# Patient Record
Sex: Female | Born: 1980 | Race: White | Hispanic: No | Marital: Married | Smoking: Former smoker
Health system: Southern US, Community
[De-identification: ages and names within clinical notes are randomized; demographics above are authoritative.]

## PROBLEM LIST (undated history)

## (undated) ENCOUNTER — Inpatient Hospital Stay: Payer: Self-pay

## (undated) DIAGNOSIS — T7840XA Allergy, unspecified, initial encounter: Secondary | ICD-10-CM

## (undated) DIAGNOSIS — F32A Depression, unspecified: Secondary | ICD-10-CM

## (undated) DIAGNOSIS — F329 Major depressive disorder, single episode, unspecified: Secondary | ICD-10-CM

## (undated) DIAGNOSIS — R251 Tremor, unspecified: Secondary | ICD-10-CM

## (undated) HISTORY — DX: Major depressive disorder, single episode, unspecified: F32.9

## (undated) HISTORY — DX: Depression, unspecified: F32.A

## (undated) HISTORY — DX: Tremor, unspecified: R25.1

## (undated) HISTORY — DX: Allergy, unspecified, initial encounter: T78.40XA

---

## 2008-07-11 HISTORY — PX: TONSILLECTOMY: SUR1361

## 2008-11-19 DIAGNOSIS — J309 Allergic rhinitis, unspecified: Secondary | ICD-10-CM | POA: Insufficient documentation

## 2012-07-02 ENCOUNTER — Ambulatory Visit: Payer: Self-pay | Admitting: Obstetrics and Gynecology

## 2012-08-02 ENCOUNTER — Encounter: Payer: Self-pay | Admitting: Internal Medicine

## 2012-08-02 ENCOUNTER — Ambulatory Visit (INDEPENDENT_AMBULATORY_CARE_PROVIDER_SITE_OTHER): Payer: 59 | Admitting: Internal Medicine

## 2012-08-02 VITALS — BP 124/74 | HR 79 | Temp 98.2°F | Resp 16 | Ht 66.0 in | Wt 173.2 lb

## 2012-08-02 DIAGNOSIS — R5381 Other malaise: Secondary | ICD-10-CM

## 2012-08-02 DIAGNOSIS — Z0000 Encounter for general adult medical examination without abnormal findings: Secondary | ICD-10-CM

## 2012-08-02 DIAGNOSIS — Z87891 Personal history of nicotine dependence: Secondary | ICD-10-CM | POA: Insufficient documentation

## 2012-08-02 DIAGNOSIS — G250 Essential tremor: Secondary | ICD-10-CM | POA: Insufficient documentation

## 2012-08-02 DIAGNOSIS — E663 Overweight: Secondary | ICD-10-CM

## 2012-08-02 DIAGNOSIS — Z720 Tobacco use: Secondary | ICD-10-CM

## 2012-08-02 DIAGNOSIS — F17200 Nicotine dependence, unspecified, uncomplicated: Secondary | ICD-10-CM

## 2012-08-02 DIAGNOSIS — R5383 Other fatigue: Secondary | ICD-10-CM

## 2012-08-02 DIAGNOSIS — E669 Obesity, unspecified: Secondary | ICD-10-CM | POA: Insufficient documentation

## 2012-08-02 DIAGNOSIS — G252 Other specified forms of tremor: Secondary | ICD-10-CM

## 2012-08-02 DIAGNOSIS — Z13220 Encounter for screening for lipoid disorders: Secondary | ICD-10-CM

## 2012-08-02 LAB — COMPREHENSIVE METABOLIC PANEL
BUN: 13 mg/dL (ref 6–23)
CO2: 27 mEq/L (ref 19–32)
Creatinine, Ser: 0.7 mg/dL (ref 0.4–1.2)
GFR: 102.73 mL/min (ref >60.00)
Glucose, Bld: 84 mg/dL (ref 70–99)
Total Bilirubin: 0.7 mg/dL (ref 0.3–1.2)

## 2012-08-02 LAB — LIPID PANEL
Cholesterol: 123 mg/dL (ref 0–200)
HDL: 36.3 mg/dL — ABNORMAL LOW (ref >39.00)
Triglycerides: 50 mg/dL (ref 0.0–149.0)
VLDL: 10 mg/dL (ref 0.0–40.0)

## 2012-08-02 LAB — CBC WITH DIFFERENTIAL/PLATELET
Basophils Relative: 0.9 % (ref 0.0–3.0)
Eosinophils Relative: 1.8 % (ref 0.0–5.0)
Lymphocytes Relative: 28 % (ref 12.0–46.0)
Monocytes Relative: 9.5 % (ref 3.0–12.0)
Neutrophils Relative %: 59.8 % (ref 43.0–77.0)
RBC: 4.24 Mil/uL (ref 3.87–5.11)
WBC: 5.7 10*3/uL (ref 4.5–10.5)

## 2012-08-02 MED ORDER — VARENICLINE TARTRATE 0.5 MG X 11 & 1 MG X 42 PO MISC: ORAL | Status: DC

## 2012-08-02 MED ORDER — VARENICLINE TARTRATE 1 MG PO TABS: 1.0000 mg | ORAL_TABLET | Freq: Two times a day (BID) | ORAL | Status: DC

## 2012-08-02 NOTE — Progress Notes (Signed)
Patient ID: Brianna Washington, female   DOB: [birth year], 32 y.o.   MRN: [id number]   Patient Active Problem List  Diagnosis  . Routine general medical examination at a health care facility  . Tobacco abuse  . Overweight  . Essential tremor    Subjective:  CC:   Chief Complaint  Patient presents with  . Establish Care    HPI:   Brianna Washington is a 32 y.o. 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   Abdominal/pelvic cramping and intermenstrual spotting. She had a Mirena IUD placed in June,  and had ultrasound in Dec which confirmed proper positioning. She is awaiting followup with Dr. Toya Smothers,    Tobacco abuse .  Planning to quit before her wedding in August.  Has had two prior trials of smoking cessations.  Wants to resume chantix.   Essential tremor.   Left hand more than right. Aggravated by fatigue and anxiety. Drinks 2 or 3  Cans of caffeinated soda daily along with occasional coughing.  Has cut back on soda. And saw no difference. Takes primodone and propanolol prescribed by former PCP  following a formal  neurolgoy eval while living in [place].  Her grandmother has an essential tremor which is progressive and may have been diagnosed with Parkinson's.  She is concerned that she will progress like her grandfather who has a lot of trouble now.   Overweight,  Wants to lose 20 lbs by August.  Not exercising or dieting yet. Has lost weight in the past  10 lbs/3 months  history of  Right breast lump,  Evaluated by [facility] with ultrasound,  ultrasound report suggested  Fatty tumor,  No biopsy necessary. it was recommended that she resume regular screening at age 33.     Past Medical History  Diagnosis Date  . Allergy   . Tremors of nervous system     Essential Tremors    Past Surgical History  Procedure Date  .  Tonsillectomy 2010    Family History  Problem Relation Age of Onset  . Hyperlipidemia Mother   . Hypertension Mother   . Hyperthyroidism Mother   . Hyperlipidemia Father   . Cancer Maternal Grandmother 60    metastatic breast ca   . Heart attack Maternal Grandfather   . Heart disease Maternal Grandfather     AMI  . Cancer Paternal Grandmother     NOn-Hodgkins Lymphoma  . Diabetes Paternal Grandmother   . Heart disease Paternal Grandfather     History   Social History  . Marital Status: Single    Spouse Name: N/A    Number of Children: N/A  . Years of Education: N/A   Occupational History  . Not on file.   Social History Main Topics  . Smoking status: Current Every Day Smoker -- 0.2 packs/day    Types: Cigarettes  . Smokeless tobacco: Not on file  . Alcohol Use: Yes  . Drug Use: No  . Sexually Active: Yes    Birth Control/ Protection: IUD     Comment: quit for 2 years once , has tried Chantix 6 months    Other Topics Concern  . Not on file   Social History Narrative  . 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    Objective:  BP 124/74  Pulse 79  Temp 98.2 F (36.8 C) (Oral)  Resp 16  Ht 5\' 6"  (1.676 m)  Wt 173 lb 4 oz (78.586 kg)  BMI 27.96 kg/m2  SpO2 98%  General appearance: alert, cooperative and appears stated age Ears: normal TM's and external ear canals both ears Throat: lips, mucosa, and tongue normal; teeth and gums normal Neck: no  adenopathy, no carotid bruit, supple, symmetrical, trachea midline and thyroid not enlarged, symmetric, no tenderness/mass/nodules Back: symmetric, no curvature. ROM normal. No CVA tenderness. Lungs: clear to auscultation bilaterally Heart: regular rate and rhythm, S1, S2 normal, no murmur, click, rub or gallop Abdomen: soft, non-tender; bowel sounds normal; no masses,  no organomegaly Pulses: 2+ and symmetric Skin: Skin color, texture, turgor normal. No rashes or lesions Lymph nodes: Cervical, supraclavicular, and axillary nodes normal.  Assessment and Plan:  Essential tremor She has had a prior neurology evaluation with no evidence of Parkinson's disease. We'll continue current medications for now and follow symptoms.  Tobacco abuse She's had prior to smoking cessation trials and wants to resume Chantix. She's been given prescriptions for the starting pack  And  monthly continuation pack. I have advised a minimum of 3 months of therapy to increase her chances for success.  Routine general medical examination at a health care facility She has regular GYN followup with Dr. Yetta Barre. Screening for hyperlipidemia thyroid anemia and diabetes to be done after today's visit with fasting labs.  Overweight I have addressed  BMI and recommended a low glycemic index diet utilizing smaller more frequent meals to increase metabolism.  I have also recommended that patient start exercising with a goal of 30 minutes of aerobic exercise a minimum of 5 days per week. Screening for lipid disorders, thyroid and diabetes to be done today.     Updated Medication List Outpatient Encounter Prescriptions as of 08/02/2012  Medication Sig Dispense Refill  . cetirizine (ZYRTEC) 10 MG tablet Take 10 mg by mouth daily.      . clonazePAM (KLONOPIN) 1 MG tablet Take 1 mg by mouth at bedtime as needed.      Marland Kitchen ibuprofen (ADVIL,MOTRIN) 800 MG tablet Take 800 mg by mouth 2 (two) times daily.      Marland Kitchen levonorgestrel (MIRENA)  20 MCG/24HR IUD 1 each by Intrauterine route once.      . Multiple Vitamin (MULTIVITAMIN) tablet Take 1 tablet by mouth daily.      Marland Kitchen PRIMIDONE PO Take 25 mg by mouth at bedtime.      . propranolol (INDERAL) 10 MG tablet Take 10 mg by mouth 3 (three) times daily.      . vitamin C (ASCORBIC ACID) 500 MG tablet Take 500 mg by mouth daily.      . varenicline (CHANTIX CONTINUING MONTH PAK) 1 MG tablet Take 1 tablet (1 mg total) by mouth 2 (two) times daily.  60 tablet  2  . varenicline (CHANTIX STARTING MONTH PAK) 0.5 MG X 11 & 1 MG X 42 tablet Take one 0.5 mg tablet by mouth once daily for 3 days, then increase to  one 0.5 mg tablet twice daily for 4 days, then increase to one 1 mg tablet twice daily.  53 tablet  0     Orders Placed This Encounter  Procedures  . CBC with Differential  . Comprehensive metabolic panel  . Lipid panel  . TSH    No Follow-up on file.

## 2012-08-02 NOTE — Patient Instructions (Addendum)
If you want to lose 20 lbs over the next 6 months ,  Try a low carb diet with small meals every 3 hours (6 total meals daily).   This is  my version of a  "Low GI"  Diet:  It is not ultra low carb, but will still lower your blood sugars and allow you to lose 5 to 10 lbs per month if you follow it carefully. All of the foods can be found at grocery stores and in bulk at [employer].  The Atkins protein bars and shakes are available in more varieties at Target, WalMart and Lowe's Foods.     7 AM Breakfast:  Low carbohydrate Protein  Shakes (I recommend the EAS AdvantEdge "Carb Control" shakes  Or the low carb shakes by Atkins.   Both are available everywhere:  In  cases at BJs  Or in 4 packs at grocery stores and pharmacies  2.5 carbs  (Alternative is  a toasted Arnold's Sandwhich Thin w/ peanut butter, a "Bagel Thin" with cream cheese and salmon) or  a scrambled egg burrito made with a low carb tortilla .  Avoid cereal and bananas, oatmeal too unless you are cooking the old fashioned kind that takes 30-40 minutes to prepare.  the rest is overly processed, has minimal fiber, and is loaded with carbohydrates!   10 AM: Protein bar by Atkins (the snack size, under 200 cal).  There are many varieties , available widely again or in bulk in limited varieties at BJs)  Other so called "protein bars" tend to be loaded with carbohydrates.  Remember, in food advertising, the word "energy" is synonymous for " carbohydrate."  Lunch: sandwich of [place], (or any lunchmeat, grilled meat or canned tuna), fresh avocado, mayonnaise  and cheese on a lower carbohydrate pita bread, flatbread, or tortilla . Ok to use regular mayonnaise. The bread is the only source or carbohydrate that can be decreased (Joseph's makes a pita bread and a flat bread that are 50 cal and 4 net carbs ; Toufayan makes a low carb flatbread that's 100 cal and 9 net carbs  and  Mission makes a low carb whole wheat tortilla  That is 210 cal and 6 net  carbs)  3 PM:  Mid day :  Another protein bar,  Or a  cheese stick (100 cal, 0 carbs),  Or 1 ounce of  almonds, walnuts, pistachios, pecans, peanuts,  Macadamia nuts. Or a Dannon light n Fit greek yogurt, 80 cal 8 net carbs . Avoid "granola"; the dried cranberries and raisins are loaded with carbohydrates. Mixed nuts ok if no raisins or cranberries or dried fruit.      6 PM  Dinner:  "mean and green:"  Meat/chicken/fish or a high protein legume; , with a green salad, and a low GI  Veggie (broccoli, cauliflower, green beans, spinach, brussel sprouts. Lima beans) : Avoid "Low fat dressings, as well as Reyne Dumas and [address]! They are loaded with sugar! Instead use ranch, vinagrette,  Blue cheese, etc.  There is a low carb pasta by Dreamfield's available at [facility] that is acceptable and tastes great. Try Michel Angel's chicken piccata over low carb pasta. The chicken dish is 0 carbs, and can be found in frozen section at BJs and Lowe's. Also try [employer] "Carnitas" (pulled pork, no sauce,  0 carbs) and his pot roast.   both are in the refrigerated section at BJs   Dreamfield's makes a low carb pasta only  5 g/serving.  Available at all grocery stores,  And tastes like normal pasta  9 PM snack : Breyer's "low carb" fudgsicle or  ice cream bar (Carb Smart line), or  Weight Watcher's ice cream bar , or another "no sugar added" ice cream;a serving of fresh berries/cherries with whipped cream (Avoid bananas, pineapple, grapes  and watermelon on a regular basis because they are high in sugar)   Remember that snack Substitutions should be less than 10 carbs per serving and meals < 20 carbs. Remember to subtract fiber grams and sugar alcohols to get the "net carbs."

## 2012-08-05 ENCOUNTER — Encounter: Payer: Self-pay | Admitting: Internal Medicine

## 2012-08-05 NOTE — Assessment & Plan Note (Signed)
She has regular GYN followup with Dr. Yetta Barre. Screening for hyperlipidemia thyroid anemia and diabetes to be done after today's visit with fasting labs.

## 2012-08-05 NOTE — Assessment & Plan Note (Signed)
I have addressed  BMI and recommended a low glycemic index diet utilizing smaller more frequent meals to increase metabolism.  I have also recommended that patient start exercising with a goal of 30 minutes of aerobic exercise a minimum of 5 days per week. Screening for lipid disorders, thyroid and diabetes to be done today.   

## 2012-08-05 NOTE — Assessment & Plan Note (Signed)
She's had prior to smoking cessation trials and wants to resume Chantix. She's been given prescriptions for the starting pack  And  monthly continuation pack. I have advised a minimum of 3 months of therapy to increase her chances for success.

## 2012-08-05 NOTE — Assessment & Plan Note (Signed)
She has had a prior neurology evaluation with no evidence of Parkinson's disease. We'll continue current medications for now and follow symptoms.

## 2012-08-25 ENCOUNTER — Other Ambulatory Visit: Payer: Self-pay

## 2012-09-09 ENCOUNTER — Encounter: Payer: Self-pay | Admitting: Internal Medicine

## 2012-10-03 ENCOUNTER — Telehealth: Payer: Self-pay | Admitting: General Practice

## 2012-10-03 MED ORDER — PRIMIDONE 50 MG PO TABS: 25.0000 mg | ORAL_TABLET | Freq: Every day | ORAL | Status: DC

## 2012-10-03 NOTE — Telephone Encounter (Signed)
Pt called stating that at her last office visit you were going to begin prescribing her Primodone medication. Pleas advise.

## 2012-10-03 NOTE — Telephone Encounter (Signed)
Yes I will,  Confirm that her dose is 25 mg daily at bedtime.

## 2012-10-04 NOTE — Telephone Encounter (Signed)
Pt states on same dosage. Med filled 3/27

## 2012-10-05 ENCOUNTER — Other Ambulatory Visit: Payer: Self-pay | Admitting: General Practice

## 2012-10-05 MED ORDER — PRIMIDONE 50 MG PO TABS: 25.0000 mg | ORAL_TABLET | Freq: Every day | ORAL | Status: DC

## 2012-12-14 ENCOUNTER — Telehealth: Payer: Self-pay | Admitting: *Deleted

## 2012-12-14 MED ORDER — CLONAZEPAM 1 MG PO TABS: 1.0000 mg | ORAL_TABLET | Freq: Every evening | ORAL | Status: DC | PRN

## 2012-12-14 NOTE — Telephone Encounter (Signed)
Patient stated you told her that you would refill her klonopin please advise

## 2012-12-14 NOTE — Telephone Encounter (Signed)
Ok to refill clonazepam,  Authorized in [employer]

## 2012-12-17 NOTE — Telephone Encounter (Signed)
Script faxed.

## 2012-12-31 ENCOUNTER — Telehealth: Payer: Self-pay | Admitting: *Deleted

## 2012-12-31 MED ORDER — PROPRANOLOL HCL 10 MG PO TABS: 10.0000 mg | ORAL_TABLET | Freq: Three times a day (TID) | ORAL | Status: DC

## 2012-12-31 NOTE — Telephone Encounter (Signed)
Medication refilled

## 2013-01-09 ENCOUNTER — Encounter: Payer: Self-pay | Admitting: Internal Medicine

## 2013-04-23 ENCOUNTER — Encounter: Payer: Self-pay | Admitting: *Deleted

## 2013-04-24 ENCOUNTER — Ambulatory Visit (INDEPENDENT_AMBULATORY_CARE_PROVIDER_SITE_OTHER): Payer: 59 | Admitting: Internal Medicine

## 2013-04-24 ENCOUNTER — Encounter: Payer: Self-pay | Admitting: Internal Medicine

## 2013-04-24 VITALS — BP 106/58 | HR 95 | Temp 98.5°F | Resp 14 | Wt 183.0 lb

## 2013-04-24 DIAGNOSIS — F489 Nonpsychotic mental disorder, unspecified: Secondary | ICD-10-CM

## 2013-04-24 DIAGNOSIS — F409 Phobic anxiety disorder, unspecified: Secondary | ICD-10-CM

## 2013-04-24 DIAGNOSIS — Z720 Tobacco use: Secondary | ICD-10-CM

## 2013-04-24 DIAGNOSIS — E663 Overweight: Secondary | ICD-10-CM

## 2013-04-24 DIAGNOSIS — G250 Essential tremor: Secondary | ICD-10-CM

## 2013-04-24 DIAGNOSIS — F17200 Nicotine dependence, unspecified, uncomplicated: Secondary | ICD-10-CM

## 2013-04-24 MED ORDER — CLONAZEPAM 1 MG PO TABS: 1.0000 mg | ORAL_TABLET | Freq: Every evening | ORAL | Status: DC | PRN

## 2013-04-24 NOTE — Patient Instructions (Signed)
You need to lose weight!  Make your first goal 10% of your body weight (18 lbs)   This is  My  version of a  "Low GI"  Diet:  It will still lower your blood sugars and allow you to lose 4 to 8  lbs  per month if you follow it carefully.  Your goal with exercise is a minimum of 30 minutes of aerobic exercise 5 days per week (Walking does not count once it becomes easy!)    All of the foods can be found at grocery stores and in bulk at [employer].  The Atkins protein bars and shakes are available in more varieties at Target, WalMart and Lowe's Foods.     7 AM Breakfast:  Choose from the following:  Low carbohydrate Protein  Shakes (I recommend the EAS AdvantEdge "Carb Control" shakes  Or the low carb shakes by Atkins.    2.5 carbs   Arnold's "Sandwhich Thin"toasted  w/ peanut butter (no jelly: about 20 net carbs  "Bagel Thin" with cream cheese and salmon: about 20 carbs   a scrambled egg/bacon/cheese burrito made with Mission's "carb balance" whole wheat tortilla  (about 10 net carbs )   Avoid cereal and bananas, oatmeal and cream of wheat and grits. They are loaded with carbohydrates!   10 AM: high protein snack  Protein bar by Atkins (the snack size, under 200 cal, usually < 6 net carbs).    A stick of cheese:  Around 1 carb,  100 cal     Dannon Light n Fit [place] Yogurt  (80 cal, 8 carbs)  Other so called "protein bars" and Greek yogurts tend to be loaded with carbohydrates.  Remember, in food advertising, the word "energy" is synonymous for " carbohydrate."  Lunch:   A Sandwich using the bread choices listed, Can use any  Eggs,  lunchmeat, grilled meat or canned tuna), avocado, regular mayo/mustard  and cheese.  A Salad using blue cheese, ranch,  Goddess or vinagrette,  No croutons or "confetti" and no "candied nuts" but regular nuts OK.   No pretzels or chips.  Pickles and miniature sweet peppers are a good low carb alternative that provide a "crunch"  The bread is the only source of  carbohydrate in a sandwich and  can be decreased by trying some of these alternatives to traditional loaf bread  Joseph's makes a pita bread and a flat bread that are 50 cal and 4 net carbs available at BJs and WalMart.  This can be toasted to use with hummous as well  Toufayan makes a low carb flatbread that's 100 cal and 9 net carbs available at [employer] and [employer] makes 2 sizes of  Low carb whole wheat tortilla  (The large one is 210 cal and 6 net carbs) Avoid "Low fat dressings, as well as Reyne Dumas and [address] dressings They are loaded with sugar!   3 PM/ Mid day  Snack:  Consider  1 ounce of  almonds, walnuts, pistachios, pecans, peanuts,  Macadamia nuts or a nut medley.  Avoid "granola"; the dried cranberries and raisins are loaded with carbohydrates. Mixed nuts as long as there are no raisins,  cranberries or dried fruit.     6 PM  Dinner:     Meat/fowl/fish with a green salad, and either broccoli, cauliflower, green beans, spinach, brussel sprouts or  Lima beans. DO NOT BREAD THE PROTEIN!!      There is a low carb  pasta by Dreamfield's that is acceptable and tastes great: only 5 digestible carbs/serving.( All grocery stores but BJs carry it )  Try Hurley Cisco Angelo's chicken piccata or chicken or eggplant parm over low carb pasta.(Lowes and BJs)   Marjory Lies Sanchez's "Carnitas" (pulled pork, no sauce,  0 carbs) or his beef pot roast to make a dinner burrito (at BJ's)  Pesto over low carb pasta (bj's sells a good quality pesto in the center refrigerated section of the deli   Whole wheat pasta is still full of digestible carbs and  Not as low in glycemic index as Dreamfield's.   Brown rice is still rice,  So skip the rice and noodles if you eat [place] or [place] (or at least limit to 1/2 cup)  9 PM snack :   Breyer's "low carb" fudgsicle or  ice cream bar (Carb Smart line), or  Weight Watcher's ice cream bar , or another "no sugar added" ice cream;  a serving of fresh  berries/cherries with whipped cream   Cheese or DANNON'S LlGHT N FIT GREEK YOGURT  Avoid bananas, pineapple, grapes  and watermelon on a regular basis because they are high in sugar.  THINK OF THEM AS DESSERT  Remember that snack Substitutions should be less than 10 NET carbs per serving and meals < 20 carbs. Remember to subtract fiber grams to get the "net carbs."

## 2013-04-24 NOTE — Progress Notes (Signed)
Patient ID: Brianna Washington, female   DOB: 1980/08/19, 32 y.o.   MRN: [id number]   Patient Active Problem List   Diagnosis Date Noted  . Insomnia due to anxiety and fear 04/26/2013  . Routine general medical examination at a health care facility 08/02/2012  . History of tobacco abuse 08/02/2012  . Overweight 08/02/2012  . Essential tremor 08/02/2012    Subjective:  CC:   Chief Complaint  Patient presents with  . Follow-up    med refill for propanolol and klonopin    HPI:   Brianna Washington a 32 y.o. female who presents Follow up on chronic conditions including BET, insomnia , obesity and tobacco abuse.  She feels generally well and has no acute complaints.   Tremor unchanged  Has not seen neuro in 2 yrs  Dr Charlotta Newton in  [place] .  Does not feel she neeeds to someone at this time.   She is using clonazepam prn insomnia. Has not escalated her use.   Obesity.  She was  married in August and lost 10 lbs using the [employer] before the marriage, then rebounded.  Using the Mirena IUD for birth control. Is not exercising regularly or currently following a diet.  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   She has been tobacco free for several months using chantix       Past Medical History  Diagnosis Date  . Allergy   . Tremors of nervous system     Essential Tremors    Past Surgical History  Procedure Laterality Date  . Tonsillectomy  2010       The following portions of the patient's history were reviewed and updated as appropriate: Allergies, current medications, and problem list.    Review of Systems:   12 Pt  review of systems was negative except those addressed in the HPI,     History   Social History  . Marital Status: Married    Spouse Name: N/A    Number of Children: N/A  . Years of Education: N/A   Occupational History  . Not on file.   Social History Main Topics  . Smoking status: Former Smoker  -- 0.25 packs/day    Types: Cigarettes    Quit date: 08/18/2012  . Smokeless tobacco: Not on file  . Alcohol Use: Yes  . Drug Use: No  . Sexual Activity: Yes    Birth Control/ Protection: IUD     Comment: quit for 2 years once , has tried Chantix 6 months    Other Topics Concern  . Not on file   Social History Narrative  . No narrative on file    Objective:  Filed Vitals:   04/24/13 0949  BP: 106/58  Pulse: 95  Temp: 98.5 F (36.9 C)  Resp: 14     General appearance: alert, cooperative and appears stated age Ears: normal TM's and external ear canals both ears Throat: lips, mucosa, and tongue normal; teeth and gums normal Neck: no adenopathy, no carotid bruit, supple, symmetrical, trachea midline and thyroid not enlarged, symmetric, no tenderness/mass/nodules Back: symmetric, no curvature. ROM normal. No CVA tenderness. Lungs: clear to auscultation bilaterally Heart: regular rate and rhythm, S1, S2 normal, no murmur, click, rub or gallop Abdomen: soft, non-tender; bowel sounds normal; no masses,  no organomegaly Pulses: 2+ and symmetric Skin: Skin color, texture, turgor normal. No rashes or lesions  Lymph nodes: Cervical, supraclavicular, and axillary nodes normal.  Assessment and Plan:  Essential tremor She has had a prior neurology evaluation with no evidence of Parkinson's disease. We'll continue current medications for now and follow symptoms.    Overweight Body mass index is 29.55 kg/(m^2). I have addressed  BMI and recommended wt loss of 10% of body weigh over the next 6 months using a low glycemic index diet and regular exercise a minimum of 5 days per week.    History of tobacco abuse She is tobacco free,   Insomnia due to anxiety and fear Managed with prn clonazepam.  Refills given.,  Risks and benefits reviewed.    Updated Medication List Outpatient Encounter Prescriptions as of 04/24/2013  Medication Sig Dispense Refill  . cetirizine (ZYRTEC) 10  MG tablet Take 10 mg by mouth daily.      . clonazePAM (KLONOPIN) 1 MG tablet Take 1 tablet (1 mg total) by mouth at bedtime as needed.  30 tablet  3  . fexofenadine (ALLEGRA) 180 MG tablet Take 180 mg by mouth daily.      . Multiple Vitamin (MULTIVITAMIN) tablet Take 1 tablet by mouth daily.      Marland Kitchen PRESCRIPTION MEDICATION Take 1 tablet by mouth daily. Birth control pill, unsure name      . primidone (MYSOLINE) 50 MG tablet Take 0.5 tablets (25 mg total) by mouth at bedtime.  30 tablet  5  . vitamin C (ASCORBIC ACID) 500 MG tablet Take 500 mg by mouth daily.      . [DISCONTINUED] clonazePAM (KLONOPIN) 1 MG tablet Take 1 tablet (1 mg total) by mouth at bedtime as needed.  30 tablet  3  . [DISCONTINUED] propranolol (INDERAL) 10 MG tablet Take 1 tablet (10 mg total) by mouth 3 (three) times daily.  90 tablet  0  . [DISCONTINUED] ibuprofen (ADVIL,MOTRIN) 800 MG tablet Take 800 mg by mouth 2 (two) times daily.      . [DISCONTINUED] levonorgestrel (MIRENA) 20 MCG/24HR IUD 1 each by Intrauterine route once.      . [DISCONTINUED] primidone (MYSOLINE) 50 MG tablet Take 0.5 tablets (25 mg total) by mouth at bedtime.  30 tablet  6  . [DISCONTINUED] varenicline (CHANTIX CONTINUING MONTH PAK) 1 MG tablet Take 1 tablet (1 mg total) by mouth 2 (two) times daily.  60 tablet  2  . [DISCONTINUED] varenicline (CHANTIX STARTING MONTH PAK) 0.5 MG X 11 & 1 MG X 42 tablet Take one 0.5 mg tablet by mouth once daily for 3 days, then increase to one 0.5 mg tablet twice daily for 4 days, then increase to one 1 mg tablet twice daily.  53 tablet  0   No facility-administered encounter medications on file as of 04/24/2013.     No orders of the defined types were placed in this encounter.    No Follow-up on file.

## 2013-04-25 ENCOUNTER — Other Ambulatory Visit: Payer: Self-pay | Admitting: *Deleted

## 2013-04-25 MED ORDER — PROPRANOLOL HCL 10 MG PO TABS: 10.0000 mg | ORAL_TABLET | Freq: Three times a day (TID) | ORAL | Status: DC

## 2013-04-26 ENCOUNTER — Encounter: Payer: Self-pay | Admitting: Internal Medicine

## 2013-04-26 DIAGNOSIS — G4720 Circadian rhythm sleep disorder, unspecified type: Secondary | ICD-10-CM | POA: Insufficient documentation

## 2013-04-26 NOTE — Assessment & Plan Note (Signed)
She is tobacco free,

## 2013-04-26 NOTE — Assessment & Plan Note (Signed)
Body mass index is 29.55 kg/(m^2). I have addressed  BMI and recommended wt loss of 10% of body weigh over the next 6 months using a low glycemic index diet and regular exercise a minimum of 5 days per week.

## 2013-04-26 NOTE — Assessment & Plan Note (Signed)
She has had a prior neurology evaluation with no evidence of Parkinson's disease. We'll continue current medications for now and follow symptoms.

## 2013-04-26 NOTE — Assessment & Plan Note (Signed)
Managed with prn clonazepam.  Refills given.,  Risks and benefits reviewed.

## 2013-05-13 ENCOUNTER — Encounter: Payer: Self-pay | Admitting: Internal Medicine

## 2013-05-20 ENCOUNTER — Encounter: Payer: Self-pay | Admitting: Internal Medicine

## 2013-05-23 ENCOUNTER — Telehealth: Payer: Self-pay | Admitting: Internal Medicine

## 2013-05-23 NOTE — Telephone Encounter (Signed)
Pt left vm.  States she left msg on Monday regarding a note for short term disability.  Asking for a call to let her know if this is possible.

## 2013-05-24 NOTE — Telephone Encounter (Signed)
Tried to reach patient left voicemail to return call to office.

## 2013-05-28 DIAGNOSIS — Z0279 Encounter for issue of other medical certificate: Secondary | ICD-10-CM

## 2013-05-28 NOTE — Telephone Encounter (Signed)
Just gave to you in Red folder.

## 2013-05-28 NOTE — Telephone Encounter (Signed)
Letter written and on printer.  (Charge for writing letter in red folder with form ) .  I cannot guarantee anything.  Looking at the form,  I believe the reason she was denied is because she gave them too much information .  She did not have to give them the info about her tremor because it was nsted as on of the "reportable" conditions under Section 6 Question 4.

## 2013-05-28 NOTE — Telephone Encounter (Signed)
Pt dropped off paperwork regarding short-term disability.  Copy made and placed in Dr. Melina Schools box.

## 2013-05-30 NOTE — Telephone Encounter (Signed)
Patient notified and is to pickup letter.

## 2013-06-25 LAB — HM PAP SMEAR: HM PAP: NORMAL

## 2013-07-16 ENCOUNTER — Encounter: Payer: 59 | Admitting: Internal Medicine

## 2013-07-26 ENCOUNTER — Ambulatory Visit (INDEPENDENT_AMBULATORY_CARE_PROVIDER_SITE_OTHER): Payer: 59 | Admitting: Internal Medicine

## 2013-07-26 ENCOUNTER — Encounter: Payer: Self-pay | Admitting: Internal Medicine

## 2013-07-26 VITALS — BP 102/68 | HR 86 | Temp 98.3°F | Resp 16 | Ht 64.75 in | Wt 173.8 lb

## 2013-07-26 DIAGNOSIS — G252 Other specified forms of tremor: Secondary | ICD-10-CM

## 2013-07-26 DIAGNOSIS — R5381 Other malaise: Secondary | ICD-10-CM

## 2013-07-26 DIAGNOSIS — R5383 Other fatigue: Secondary | ICD-10-CM

## 2013-07-26 DIAGNOSIS — E785 Hyperlipidemia, unspecified: Secondary | ICD-10-CM

## 2013-07-26 DIAGNOSIS — G4720 Circadian rhythm sleep disorder, unspecified type: Secondary | ICD-10-CM

## 2013-07-26 DIAGNOSIS — Z0000 Encounter for general adult medical examination without abnormal findings: Secondary | ICD-10-CM

## 2013-07-26 DIAGNOSIS — G250 Essential tremor: Secondary | ICD-10-CM

## 2013-07-26 DIAGNOSIS — E663 Overweight: Secondary | ICD-10-CM

## 2013-07-26 DIAGNOSIS — Z309 Encounter for contraceptive management, unspecified: Secondary | ICD-10-CM

## 2013-07-26 LAB — COMPREHENSIVE METABOLIC PANEL
ALT: 12 U/L (ref 0–35)
AST: 15 U/L (ref 0–37)
Albumin: 4 g/dL (ref 3.5–5.2)
Alkaline Phosphatase: 59 U/L (ref 39–117)
BUN: 11 mg/dL (ref 6–23)
CHLORIDE: 102 meq/L (ref 96–112)
CO2: 27 mEq/L (ref 19–32)
Calcium: 9.5 mg/dL (ref 8.4–10.5)
Creatinine, Ser: 0.9 mg/dL (ref 0.4–1.2)
GFR: 82.92 mL/min (ref >60.00)
GLUCOSE: 73 mg/dL (ref 70–99)
Potassium: 4 mEq/L (ref 3.5–5.1)
Sodium: 137 mEq/L (ref 135–145)
TOTAL PROTEIN: 7 g/dL (ref 6.0–8.3)
Total Bilirubin: 0.7 mg/dL (ref 0.3–1.2)

## 2013-07-26 LAB — CBC WITH DIFFERENTIAL/PLATELET
Basophils Absolute: 0.1 10*3/uL (ref 0.0–0.1)
Basophils Relative: 0.5 % (ref 0.0–3.0)
Eosinophils Absolute: 0.1 10*3/uL (ref 0.0–0.7)
Eosinophils Relative: 1.4 % (ref 0.0–5.0)
HCT: 44 % (ref 36.0–46.0)
Hemoglobin: 14.6 g/dL (ref 12.0–15.0)
Lymphocytes Relative: 29.3 % (ref 12.0–46.0)
Lymphs Abs: 3 10*3/uL (ref 0.7–4.0)
MCHC: 33.2 g/dL (ref 30.0–36.0)
MCV: 92.1 fl (ref 78.0–100.0)
MONOS PCT: 7.5 % (ref 3.0–12.0)
Monocytes Absolute: 0.8 10*3/uL (ref 0.1–1.0)
NEUTROS PCT: 61.3 % (ref 43.0–77.0)
Neutro Abs: 6.3 10*3/uL (ref 1.4–7.7)
PLATELETS: 252 10*3/uL (ref 150.0–400.0)
RBC: 4.78 Mil/uL (ref 3.87–5.11)
RDW: 14.2 % (ref 11.5–14.6)
WBC: 10.3 10*3/uL (ref 4.5–10.5)

## 2013-07-26 LAB — LIPID PANEL
Cholesterol: 201 mg/dL — ABNORMAL HIGH (ref 0–200)
HDL: 46.5 mg/dL (ref >39.00)
TRIGLYCERIDES: 121 mg/dL (ref 0.0–149.0)
Total CHOL/HDL Ratio: 4
VLDL: 24.2 mg/dL (ref 0.0–40.0)

## 2013-07-26 LAB — LDL CHOLESTEROL, DIRECT: Direct LDL: 147.5 mg/dL

## 2013-07-26 LAB — TSH: TSH: 1.48 u[IU]/mL (ref 0.35–5.50)

## 2013-07-26 MED ORDER — PRIMIDONE 50 MG PO TABS: 25.0000 mg | ORAL_TABLET | Freq: Every day | ORAL | Status: DC

## 2013-07-26 MED ORDER — CLONAZEPAM 1 MG PO TABS: 1.0000 mg | ORAL_TABLET | Freq: Every evening | ORAL | Status: DC | PRN

## 2013-07-26 MED ORDER — PROPRANOLOL HCL 10 MG PO TABS: 10.0000 mg | ORAL_TABLET | Freq: Three times a day (TID) | ORAL | Status: DC

## 2013-07-26 NOTE — Patient Instructions (Signed)
Congratulations on the weight loss!!  When you hit a plateau,  Adding wright lifting/cardio 30 minutes 5 days /week is the most effective use of gym time

## 2013-07-26 NOTE — Progress Notes (Signed)
Patient ID: Brianna Washington, female   DOB: 14-May-1981, 33 y.o.   MRN: [id number]    Subjective:     Brianna Washington is a 33 y.o. female and is here for a comprehensive physical exam. The patient reports .Marland Kitchen  History   Social History  . Marital Status: Married    Spouse Name: N/A    Number of Children: N/A  . Years of Education: N/A   Occupational History  . Not on file.   Social History Main Topics  . Smoking status: Former Smoker -- 0.25 packs/day    Types: Cigarettes    Quit date: 08/18/2012  . Smokeless tobacco: Not on file  . Alcohol Use: Yes  . Drug Use: No  . Sexual Activity: Yes    Birth Control/ Protection: IUD     Comment: quit for 2 years once , has tried Chantix 6 months    Other Topics Concern  . Not on file   Social History Narrative  . No narrative on file   Health Maintenance  Topic Date Due  . Influenza Vaccine  02/08/2014  . Tetanus/tdap  04/24/2016  . Pap Smear  06/25/2016    The following portions of the patient's history were reviewed and updated as appropriate: allergies, current medications, past family history, past medical history, past social history, past surgical history and problem list.  Review of Systems A comprehensive review of systems was negative.   Objective:   BP 102/68  Pulse 86  Temp(Src) 98.3 F (36.8 C) (Oral)  Resp 16  Ht 5' 4.75" (1.645 m)  Wt 173 lb 12 oz (78.812 kg)  BMI 29.12 kg/m2  SpO2 99%  LMP 07/20/2013  General Appearance:    Alert, cooperative, no distress, appears stated age  Head:    Normocephalic, without obvious abnormality, atraumatic  Eyes:    PERRL, conjunctiva/corneas clear, EOM's intact, fundi    benign, both eyes  Ears:    Normal TM's and external ear canals, both ears  Nose:   Nares normal, septum midline, mucosa normal, no drainage    or sinus tenderness  Throat:   Lips, mucosa, and tongue normal; teeth and gums normal  Neck:   Supple, symmetrical, trachea midline, no adenopathy;   thyroid:  no enlargement/tenderness/nodules; no carotid   bruit or JVD  Back:     Symmetric, no curvature, ROM normal, no CVA tenderness  Lungs:     Clear to auscultation bilaterally, respirations unlabored  Chest Wall:    No tenderness or deformity   Heart:    Regular rate and rhythm, S1 and S2 normal, no murmur, rub   or gallop  Breast Exam:    No tenderness, masses, or nipple abnormality  Abdomen:     Soft, non-tender, bowel sounds active all four quadrants,    no masses, no organomegaly  Extremities:   Extremities normal, atraumatic, no cyanosis or edema  Pulses:   2+ and symmetric all extremities  Skin:   Skin color, texture, turgor normal, no rashes or lesions  Lymph nodes:   Cervical, supraclavicular, and axillary nodes normal  Neurologic:   CNII-XII intact, normal strength, sensation and reflexes    throughout    Assessment and Plan:    Routine general medical examination at a health care facility Annual comprehensive exam was done  Today.  All screenings have been addressed .   Overweight She has lost 10 pounds by following a low glycemic index diet. Her BMI has not changed much because  her height has been reduced .  I encouraged her to continue following a low glycemic index diet and to initiate an exercise program with a goal of 30 minutes of cardio aerobic exercise 5 days per week.  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 She uses the clonazepam to help initiate sleep on the night she has to adjust. There has been no escalation in use.   Updated Medication List Outpatient Encounter Prescriptions as of 07/26/2013  Medication Sig  . cetirizine (ZYRTEC) 10 MG tablet Take 10 mg by mouth daily.  . clonazePAM (KLONOPIN) 1 MG tablet Take 1 tablet (1 mg total) by mouth at bedtime as needed.  . Multiple Vitamin (MULTIVITAMIN) tablet Take 1 tablet by mouth daily.  .  norethindrone-ethinyl estradiol 1/35 (DASETTA 1/35) tablet Take 1 tablet by mouth daily.  . primidone (MYSOLINE) 50 MG tablet Take 0.5 tablets (25 mg total) by mouth at bedtime.  . propranolol (INDERAL) 10 MG tablet Take 1 tablet (10 mg total) by mouth 3 (three) times daily.  . vitamin C (ASCORBIC ACID) 500 MG tablet Take 500 mg by mouth daily.  . [DISCONTINUED] clonazePAM (KLONOPIN) 1 MG tablet Take 1 tablet (1 mg total) by mouth at bedtime as needed.  . [DISCONTINUED] primidone (MYSOLINE) 50 MG tablet Take 0.5 tablets (25 mg total) by mouth at bedtime.  . [DISCONTINUED] propranolol (INDERAL) 10 MG tablet Take 1 tablet (10 mg total) by mouth 3 (three) times daily.  . [DISCONTINUED] fexofenadine (ALLEGRA) 180 MG tablet Take 180 mg by mouth daily.  . [DISCONTINUED] PRESCRIPTION MEDICATION Take 1 tablet by mouth daily. Birth control pill, unsure name

## 2013-07-28 ENCOUNTER — Encounter: Payer: Self-pay | Admitting: Internal Medicine

## 2013-07-28 NOTE — Assessment & Plan Note (Signed)
She has lost 10 pounds by following a low glycemic index diet. Her BMI has not changed much because her height has been reduced .  I encouraged her to continue following a low glycemic index diet and to initiate an exercise program with a goal of 30 minutes of cardio aerobic exercise 5 days per week.

## 2013-07-28 NOTE — Assessment & Plan Note (Addendum)
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 She uses the clonazepam to help initiate sleep on the night she has to adjust. There has been no escalation in use.

## 2013-07-28 NOTE — Assessment & Plan Note (Signed)
Annual comprehensive exam was done  Today.  All screenings have been addressed .

## 2013-09-18 ENCOUNTER — Telehealth: Payer: Self-pay | Admitting: *Deleted

## 2013-09-18 NOTE — Telephone Encounter (Signed)
Patient called to schedule appointment to discuss impending pregnancy and medication. Appointment scheduled .

## 2013-10-02 ENCOUNTER — Ambulatory Visit: Payer: 59 | Admitting: Internal Medicine

## 2013-10-07 ENCOUNTER — Encounter: Payer: Self-pay | Admitting: Internal Medicine

## 2013-10-07 ENCOUNTER — Ambulatory Visit: Payer: 59 | Admitting: Internal Medicine

## 2013-10-07 VITALS — BP 110/70 | HR 88 | Temp 98.3°F | Resp 16 | Wt 173.5 lb

## 2013-10-07 DIAGNOSIS — G250 Essential tremor: Secondary | ICD-10-CM

## 2013-10-07 DIAGNOSIS — G4720 Circadian rhythm sleep disorder, unspecified type: Secondary | ICD-10-CM

## 2013-10-07 DIAGNOSIS — Z7189 Other specified counseling: Secondary | ICD-10-CM

## 2013-10-07 MED ORDER — AZELASTINE-FLUTICASONE 137-50 MCG/ACT NA SUSP: 2.0000 | Freq: Every day | NASAL | Status: DC

## 2013-10-07 MED ORDER — LABETALOL HCL 100 MG PO TABS: 100.0000 mg | ORAL_TABLET | Freq: Two times a day (BID) | ORAL | Status: DC

## 2013-10-07 NOTE — Progress Notes (Signed)
Patient ID: Brianna GlasgowChristina E Washington, female   DOB: 05/28/1981, 33 y.o.   MRN: [id number]

## 2013-10-07 NOTE — Progress Notes (Signed)
Pre-visit discussion using our clinic review tool. No additional management support is needed unless otherwise documented below in the visit note.  

## 2013-10-07 NOTE — Patient Instructions (Addendum)
Reduce the mysoline to 1/2  Tablet every other day then stop   Change propranolol to labetalol once or twice daily  For your tremor  Reduce your antihistamine use to one dose daily (choose benadryl instead of clonazepam)  And start using Dymista 2 squirts in each nostril twice daily for your allergies

## 2013-10-08 ENCOUNTER — Encounter: Payer: Self-pay | Admitting: Internal Medicine

## 2013-10-08 DIAGNOSIS — Z7189 Other specified counseling: Secondary | ICD-10-CM | POA: Insufficient documentation

## 2013-10-08 NOTE — Assessment & Plan Note (Signed)
She has had a prior neurology evaluation with no evidence of Parkinson's disease. And is currently using mysoline and inderal.  Tapering off of mysoline and changing  Inderal to labetalol.

## 2013-10-08 NOTE — Assessment & Plan Note (Signed)
All medications discussed and antihistamine use minimized to benadryl only. Mysoline and clonazepam stopped.  Inderal changed to labetalol.  Already taking prenatal vitamins.  Thyroid function was normal in January .  Lab Results  Component Value Date   TSH 1.48 07/26/2013

## 2013-10-08 NOTE — Assessment & Plan Note (Signed)
Managed with prn clonazepam. Changing to benadryl.  Other antihistamines stopped.

## 2013-12-05 ENCOUNTER — Ambulatory Visit: Payer: 59 | Admitting: Adult Health

## 2014-02-19 ENCOUNTER — Other Ambulatory Visit: Payer: Self-pay | Admitting: Internal Medicine

## 2014-06-18 ENCOUNTER — Observation Stay: Payer: Self-pay | Admitting: Obstetrics and Gynecology

## 2014-08-07 ENCOUNTER — Observation Stay: Payer: Self-pay | Admitting: Obstetrics and Gynecology

## 2014-08-20 ENCOUNTER — Inpatient Hospital Stay: Payer: Self-pay | Admitting: Obstetrics and Gynecology

## 2014-10-14 ENCOUNTER — Encounter: Payer: Self-pay | Admitting: *Deleted

## 2014-11-18 NOTE — H&P (Signed)
L&D Evaluation:  History:  HPI Brianna Washington is a 34 y.o. G1P0 married Caucasian female who presents with c/o contractions since 12:00pm today.  LMP 11/05/13, EGA 40.6 weeks, EDD 08/14/14.   Presents with contractions   Patient's Medical History Benign essential tremor   Patient's Surgical History none   Medications Pre Natal Vitamins  Labetalol 100 mg daily   Allergies NKDA   Social History none   Family History Non-Contributory   ROS:  General normal   HEENT normal   CNS normal   GI normal   GU contractions   Resp normal   CV normal   Renal normal   MS normal   Exam:  Vital Signs stable   Urine Protein not completed   General no apparent distress   Mental Status clear   Chest clear   Heart normal sinus rhythm   Abdomen gravid, non-tender   Estimated Fetal Weight Average for gestational age   Fetal Position longitudinal, vertex   Back no CVAT   Edema Nonpitting  trace   Pelvic no external lesions, 3/90/-1   Mebranes Intact   FHT Description Late decelerations, x 1, at 1703, from baseline 150 to 110s lasting 45 secs with recovery.   Ucx irregular, q 4-6 min   Ucx Pain Scale 5   Skin dry   Lymph no lymphadenopathy   Other A+/-/ND/NR/HIV-/VZI/RNI/GC-/Cl-/GBS-       RGS wnl (126) 1st trimester screen neg. 08/20/14: 14.1>12.9/39.4<218   Impression:  Impression active labor, h/o essential tremor (benign)   Plan:  Plan monitor contractions and for cervical change   Comments Admit to Labor and Delivery Stadol IV for pain as desired.  Epidural if desired.  Hold Labetalol (for tremor) until postpartum.  Pitocin for augmentation if needed, if fetal tracing remains reassuring.   Electronic Signatures: Fabian Novemberherry, Marthella Osorno S (MD)  (Signed 10-Feb-16 17:56)  Authored: L&D Evaluation   Last Updated: 10-Feb-16 17:56 by Fabian Novemberherry, Ezana Hubbert S (MD)

## 2015-01-28 ENCOUNTER — Ambulatory Visit (INDEPENDENT_AMBULATORY_CARE_PROVIDER_SITE_OTHER): Payer: 59 | Admitting: Internal Medicine

## 2015-01-28 VITALS — BP 100/60 | HR 82 | Temp 97.8°F | Resp 14 | Ht 64.5 in | Wt 186.5 lb

## 2015-01-28 DIAGNOSIS — G250 Essential tremor: Secondary | ICD-10-CM

## 2015-01-28 DIAGNOSIS — Z0000 Encounter for general adult medical examination without abnormal findings: Secondary | ICD-10-CM

## 2015-01-28 DIAGNOSIS — R5383 Other fatigue: Secondary | ICD-10-CM | POA: Diagnosis not present

## 2015-01-28 DIAGNOSIS — E669 Obesity, unspecified: Secondary | ICD-10-CM

## 2015-01-28 NOTE — Progress Notes (Signed)
Pre-visit discussion using our clinic review tool. No additional management support is needed unless otherwise documented below in the visit note.  

## 2015-01-28 NOTE — Patient Instructions (Signed)
I recommend getting back on the 10 day Green Smoothie Cleansing /Detox Diet by Linden Dolin .  I suggest drinking 2 smoothies daily and keeping one chewable meal (but keep it simple, like baked fish and salad, rice or bok choy) .  You snack primarily on fresh  fruit, egg whites and judicious quantities of nuts. You can add vegetable based protein powder (nothing with whey , since whey is dairy) in it.  [employer] has a Research officer, political party .  i have found that using frozen fruits is much more convenient and cost effective. You can even find plenty of organic fruit in the frozen fruit section of [employer].  Just thaw what you need for the following day the night before in the refrigerator (to avoid jamming up your machine)  Health Maintenance Adopting a healthy lifestyle and getting preventive care can go a long way to promote health and wellness. Talk with your health care provider about what schedule of regular examinations is right for you. This is a good chance for you to check in with your provider about disease prevention and staying healthy. In between checkups, there are plenty of things you can do on your own. Experts have done a lot of research about which lifestyle changes and preventive measures are most likely to keep you healthy. Ask your health care provider for more information. WEIGHT AND DIET  Eat a healthy diet  Be sure to include plenty of vegetables, fruits, low-fat dairy products, and lean protein.  Do not eat a lot of foods high in solid fats, added sugars, or salt.  Get regular exercise. This is one of the most important things you can do for your health.  Most adults should exercise for at least 150 minutes each week. The exercise should increase your heart rate and make you sweat (moderate-intensity exercise).  Most adults should also do strengthening exercises at least twice a week. This is in addition to the moderate-intensity exercise.  Maintain a healthy weight  Body mass index  (BMI) is a measurement that can be used to identify possible weight problems. It estimates body fat based on height and weight. Your health care provider can help determine your BMI and help you achieve or maintain a healthy weight.  For females 110 years of age and older:   A BMI below 18.5 is considered underweight.  A BMI of 18.5 to 24.9 is normal.  A BMI of 25 to 29.9 is considered overweight.  A BMI of 30 and above is considered obese.  Watch levels of cholesterol and blood lipids  You should start having your blood tested for lipids and cholesterol at 32 years of age, then have this test every 5 years.  You may need to have your cholesterol levels checked more often if:  Your lipid or cholesterol levels are high.  You are older than 32 years of age.  You are at high risk for heart disease.  CANCER SCREENING   Lung Cancer  Lung cancer screening is recommended for adults 6-66 years old who are at high risk for lung cancer because of a history of smoking.  A yearly low-dose CT scan of the lungs is recommended for people who:  Currently smoke.  Have quit within the past 15 years.  Have at least a 30-pack-year history of smoking. A pack year is smoking an average of one pack of cigarettes a day for 1 year.  Yearly screening should continue until it has been 15 years since you  quit.  Yearly screening should stop if you develop a health problem that would prevent you from having lung cancer treatment.  Breast Cancer  Practice breast self-awareness. This means understanding how your breasts normally appear and feel.  It also means doing regular breast self-exams. Let your health care provider know about any changes, no matter how small.  If you are in your 20s or 30s, you should have a clinical breast exam (CBE) by a health care provider every 1-3 years as part of a regular health exam.  If you are 4 or older, have a CBE every year. Also consider having a breast  X-ray (mammogram) every year.  If you have a family history of breast cancer, talk to your health care provider about genetic screening.  If you are at high risk for breast cancer, talk to your health care provider about having an MRI and a mammogram every year.  Breast cancer gene (BRCA) assessment is recommended for women who have family members with BRCA-related cancers. BRCA-related cancers include:  Breast.  Ovarian.  Tubal.  Peritoneal cancers.  Results of the assessment will determine the need for genetic counseling and BRCA1 and BRCA2 testing. Cervical Cancer Routine pelvic examinations to screen for cervical cancer are no longer recommended for nonpregnant women who are considered low risk for cancer of the pelvic organs (ovaries, uterus, and vagina) and who do not have symptoms. A pelvic examination may be necessary if you have symptoms including those associated with pelvic infections. Ask your health care provider if a screening pelvic exam is right for you.   The Pap test is the screening test for cervical cancer for women who are considered at risk.  If you had a hysterectomy for a problem that was not cancer or a condition that could lead to cancer, then you no longer need Pap tests.  If you are older than 65 years, and you have had normal Pap tests for the past 10 years, you no longer need to have Pap tests.  If you have had past treatment for cervical cancer or a condition that could lead to cancer, you need Pap tests and screening for cancer for at least 20 years after your treatment.  If you no longer get a Pap test, assess your risk factors if they change (such as having a new sexual partner). This can affect whether you should start being screened again.  Some women have medical problems that increase their chance of getting cervical cancer. If this is the case for you, your health care provider may recommend more frequent screening and Pap tests.  The human  papillomavirus (HPV) test is another test that may be used for cervical cancer screening. The HPV test looks for the virus that can cause cell changes in the cervix. The cells collected during the Pap test can be tested for HPV.  The HPV test can be used to screen women 68 years of age and older. Getting tested for HPV can extend the interval between normal Pap tests from three to five years.  An HPV test also should be used to screen women of any age who have unclear Pap test results.  After 31 years of age, women should have HPV testing as often as Pap tests.  Colorectal Cancer  This type of cancer can be detected and often prevented.  Routine colorectal cancer screening usually begins at 32 years of age and continues through 32 years of age.  Your health care provider may recommend  screening at an earlier age if you have risk factors for colon cancer.  Your health care provider may also recommend using home test kits to check for hidden blood in the stool.  A small camera at the end of a tube can be used to examine your colon directly (sigmoidoscopy or colonoscopy). This is done to check for the earliest forms of colorectal cancer.  Routine screening usually begins at age 17.  Direct examination of the colon should be repeated every 5-10 years through 32 years of age. However, you may need to be screened more often if early forms of precancerous polyps or small growths are found. Skin Cancer  Check your skin from head to toe regularly.  Tell your health care provider about any new moles or changes in moles, especially if there is a change in a mole's shape or color.  Also tell your health care provider if you have a mole that is larger than the size of a pencil eraser.  Always use sunscreen. Apply sunscreen liberally and repeatedly throughout the day.  Protect yourself by wearing long sleeves, pants, a wide-brimmed hat, and sunglasses whenever you are outside. HEART DISEASE,  DIABETES, AND HIGH BLOOD PRESSURE   Have your blood pressure checked at least every 1-2 years. High blood pressure causes heart disease and increases the risk of stroke.  If you are between 2 years and 25 years old, ask your health care provider if you should take aspirin to prevent strokes.  Have regular diabetes screenings. This involves taking a blood sample to check your fasting blood sugar level.  If you are at a normal weight and have a low risk for diabetes, have this test once every three years after 32 years of age.  If you are overweight and have a high risk for diabetes, consider being tested at a younger age or more often. PREVENTING INFECTION  Hepatitis B  If you have a higher risk for hepatitis B, you should be screened for this virus. You are considered at high risk for hepatitis B if:  You were born in a country where hepatitis B is common. Ask your health care provider which countries are considered high risk.  Your parents were born in a high-risk country, and you have not been immunized against hepatitis B (hepatitis B vaccine).  You have HIV or AIDS.  You use needles to inject street drugs.  You live with someone who has hepatitis B.  You have had sex with someone who has hepatitis B.  You get hemodialysis treatment.  You take certain medicines for conditions, including cancer, organ transplantation, and autoimmune conditions. Hepatitis C  Blood testing is recommended for:  Everyone born from [birth year] through 1963.  Anyone with known risk factors for hepatitis C. Sexually transmitted infections (STIs)  You should be screened for sexually transmitted infections (STIs) including gonorrhea and chlamydia if:  You are sexually active and are younger than 32 years of age.  You are older than 32 years of age and your health care provider tells you that you are at risk for this type of infection.  Your sexual activity has changed since you were last screened and  you are at an increased risk for chlamydia or gonorrhea. Ask your health care provider if you are at risk.  If you do not have HIV, but are at risk, it may be recommended that you take a prescription medicine daily to prevent HIV infection. This is called pre-exposure prophylaxis (PrEP). You are  considered at risk if:  You are sexually active and do not regularly use condoms or know the HIV status of your partner(s).  You take drugs by injection.  You are sexually active with a partner who has HIV. Talk with your health care provider about whether you are at high risk of being infected with HIV. If you choose to begin PrEP, you should first be tested for HIV. You should then be tested every 3 months for as long as you are taking PrEP.  PREGNANCY   If you are premenopausal and you may become pregnant, ask your health care provider about preconception counseling.  If you may become pregnant, take 400 to 800 micrograms (mcg) of folic acid every day.  If you want to prevent pregnancy, talk to your health care provider about birth control (contraception). OSTEOPOROSIS AND MENOPAUSE   Osteoporosis is a disease in which the bones lose minerals and strength with aging. This can result in serious bone fractures. Your risk for osteoporosis can be identified using a bone density scan.  If you are 12 years of age or older, or if you are at risk for osteoporosis and fractures, ask your health care provider if you should be screened.  Ask your health care provider whether you should take a calcium or vitamin D supplement to lower your risk for osteoporosis.  Menopause may have certain physical symptoms and risks.  Hormone replacement therapy may reduce some of these symptoms and risks. Talk to your health care provider about whether hormone replacement therapy is right for you.  HOME CARE INSTRUCTIONS   Schedule regular health, dental, and eye exams.  Stay current with your immunizations.   Do  not use any tobacco products including cigarettes, chewing tobacco, or electronic cigarettes.  If you are pregnant, do not drink alcohol.  If you are breastfeeding, limit how much and how often you drink alcohol.  Limit alcohol intake to no more than 1 drink per day for nonpregnant women. One drink equals 12 ounces of beer, 5 ounces of wine, or 1 ounces of hard liquor.  Do not use street drugs.  Do not share needles.  Ask your health care provider for help if you need support or information about quitting drugs.  Tell your health care provider if you often feel depressed.  Tell your health care provider if you have ever been abused or do not feel safe at home. Document Released: 01/10/2011 Document Revised: 11/11/2013 Document Reviewed: 05/29/2013 [facility] Patient Information 2015 [place], [place]. This information is not intended to replace advice given to you by your health care provider. Make sure you discuss any questions you have with your health care provider.

## 2015-01-28 NOTE — Progress Notes (Signed)
Patient ID: NITZIA PERREN, female    DOB: 12/15/1980  Age: 34 y.o. MRN: [id number]  The patient is here for annual  wellness examination and management of other chronic and acute problems.  Last seen March 2015.  Has dellivered healthy girl 5.5 months ago by Dr Valentino Saxon .  No complications .   Not sleeping well due to baby being up, naps during the day with  Baby occasionally. .Using zyrtec and claritin for allergic rhinitis,  Using  Occasional motrin for back pain   Now on progesterone only birth control.  Breast feeding  Immunizations brought up to date   Using colace for constipation twice daily and increasing  Water intake,  dialy or qod stools,  No blood occasional straining, hemorrhoid bled once.   Diet reviewed:  Fernand Parkins are the only fruit she eats.  Thinking about going on the Herbal Life Diet b ut ot surei if its ok with breastfeeding   167 pre pregnancy, gained 26 lbs,  193 tops,  Dropped to 178 post partum .  Birth control started and she started gaining again,  Now plateaued.  Plans to start a walking  Program. Rn Working weekends to avoid daycare.      The risk factors are reflected in the social history.  The roster of all physicians providing medical care to patient - is listed in the Snapshot section of the chart. Home safety : The patient has smoke detectors in the home. They wear seatbelts.  There are no firearms at home. There is no violence in the home.   There is no risks for hepatitis, STDs or HIV. There is no   history of blood transfusion. They have no travel history to infectious disease endemic areas of the world.  The patient has seen their dentist in the last six month. They have seen their eye doctor in the last year.   They do not  have excessive sun exposure. Discussed the need for sun protection: hats, long sleeves and use of sunscreen if there is significant sun exposure.   Diet: the importance of a healthy diet is discussed. They do have a healthy  diet.  The benefits of regular aerobic exercise were discussed. She walks 4 times per week ,  20 minutes.   Depression screen: there are no signs or vegative symptoms of depression- irritability, change in appetite, anhedonia, sadness/tearfullness.  Cognitive assessment: the patient manages all their financial and personal affairs and is actively engaged. They could relate day,date,year and events; recalled 2/3 objects at 3 minutes; performed clock-face test normally.  The following portions of the patient's history were reviewed and updated as appropriate: allergies, current medications, past family history, past medical history,  past surgical history, past social history  and problem list.  Visual acuity was not assessed per patient preference since she has regular follow up with her ophthalmologist. Hearing and body mass index were assessed and reviewed.   During the course of the visit the patient was educated and counseled about appropriate screening and preventive services including : fall prevention , diabetes screening, nutrition counseling, colorectal cancer screening, and recommended immunizations.    CC: The primary encounter diagnosis was Other fatigue. Diagnoses of Visit for preventive health examination, Obesity (BMI 30.0-34.9), and Essential tremor were also pertinent to this visit.  History Jamy has a past medical history of Allergy and Tremors of nervous system.   She has past surgical history that includes Tonsillectomy (2010).   Her family history includes Cancer  in her paternal grandmother; Cancer (age of onset: 43) in her maternal grandmother; Diabetes in her paternal grandmother; Heart attack in her maternal grandfather; Heart disease in her maternal grandfather and paternal grandfather; Hyperlipidemia in her father and mother; Hypertension in her mother; Hyperthyroidism in her mother.She reports that she quit smoking about 2 years ago. Her smoking use included  Cigarettes. She smoked 0.25 packs per day. She does not have any smokeless tobacco history on file. She reports that she drinks alcohol. She reports that she does not use illicit drugs.  Outpatient Prescriptions Prior to Visit  Medication Sig Dispense Refill  . labetalol (NORMODYNE) 100 MG tablet TAKE 1 TABLET (100 MG TOTAL) BY MOUTH 2 (TWO) TIMES DAILY. 60 tablet 5  . Prenatal Vit-Fe Fumarate-FA (PRENATAL VITAMINS PLUS) 27-1 MG TABS Take 1 tablet by mouth daily.    . Azelastine-Fluticasone (DYMISTA) 137-50 MCG/ACT SUSP Place 2 Squirts into the nose daily. In each nostril (Patient not taking: Reported on 01/28/2015) 23 g 11  . vitamin C (ASCORBIC ACID) 500 MG tablet Take 500 mg by mouth daily.    . Multiple Vitamin (MULTIVITAMIN) tablet Take 1 tablet by mouth daily.     No facility-administered medications prior to visit.    Review of Systems   Patient denies headache, fevers, malaise, unintentional weight loss, skin rash, eye pain, sinus congestion and sinus pain, sore throat, dysphagia,  hemoptysis , cough, dyspnea, wheezing, chest pain, palpitations, orthopnea, edema, abdominal pain, nausea, melena, diarrhea, constipation, flank pain, dysuria, hematuria, urinary  Frequency, nocturia, numbness, tingling, seizures,  Focal weakness, Loss of consciousness,  Tremor, insomnia, depression, anxiety, and suicidal ideation.      Objective:  BP 100/60 mmHg  Pulse 82  Temp(Src) 97.8 F (36.6 C) (Oral)  Resp 14  Ht 5' 4.5" (1.638 m)  Wt 186 lb 8 oz (84.596 kg)  BMI 31.53 kg/m2  SpO2 99%  Physical Exam   General appearance: alert, cooperative and appears stated age Ears: normal TM's and external ear canals both ears Throat: lips, mucosa, and tongue normal; teeth and gums normal Neck: no adenopathy, no carotid bruit, supple, symmetrical, trachea midline and thyroid not enlarged, symmetric, no tenderness/mass/nodules Back: symmetric, no curvature. ROM normal. No CVA tenderness. Lungs: clear to  auscultation bilaterally Heart: regular rate and rhythm, S1, S2 normal, no murmur, click, rub or gallop Abdomen: soft, non-tender; bowel sounds normal; no masses,  no organomegaly Pulses: 2+ and symmetric Skin: Skin color, texture, turgor normal. No rashes or lesions Lymph nodes: Cervical, supraclavicular, and axillary nodes normal.    Assessment & Plan:   Problem List Items Addressed This Visit      Unprioritized   Visit for preventive health examination    Annual wellness  exam was done as well as a comprehensive physical exam and management of acute and chronic conditions .  During the course of the visit the patient was educated and counseled about appropriate screening and preventive services including :  diabetes screening, lipid analysis with projected  10 year  risk for CAD , nutrition counseling, colorectal cancer screening, and recommended immunizations.  Printed recommendations for health maintenance screenings was given.       Obesity (BMI 30.0-34.9)    I have addressed  BMI and recommended wt loss of 10% of body weigh over the next 6 months using a low glycemic index diet and regular exercise a minimum of 5 days per week.   Wt Readings from Last 3 Encounters:  01/28/15 186 lb 8 oz (  84.596 kg)  10/07/13 173 lb 8 oz (78.699 kg)  07/26/13 173 lb 12 oz (78.812 kg)    Body mass index is 31.53 kg/(m^2).       Essential tremor    She has had a prior neurology evaluation with no evidence of Parkinson's disease and is currently using labetalol         Other Visit Diagnoses    Other fatigue    -  Primary    Relevant Orders    Comprehensive metabolic panel (Completed)    TSH (Completed)    CBC with Differential/Platelet (Completed)       I have discontinued Ms. Kertesz's multivitamin. I am also having her maintain her vitamin C, PRENATAL VITAMINS PLUS, Azelastine-Fluticasone, labetalol, cetirizine, docusate sodium, loratadine, and norethindrone.  Meds ordered this  encounter  Medications  . cetirizine (ZYRTEC) 10 MG tablet    Sig: Take 10 mg by mouth daily.  Marland Kitchen. docusate sodium (COLACE) 100 MG capsule    Sig: Take 100 mg by mouth 2 (two) times daily.  Marland Kitchen. loratadine (CLARITIN) 10 MG tablet    Sig: Take 10 mg by mouth daily.  . norethindrone (MICRONOR,CAMILA,ERRIN) 0.35 MG tablet    Sig: Take 1 tablet by mouth daily.    Medications Discontinued During This Encounter  Medication Reason  . Multiple Vitamin (MULTIVITAMIN) tablet Duplicate    Follow-up: No Follow-up on file.   Sherlene ShamsULLO, Salik Grewell L, MD

## 2015-01-29 LAB — CBC WITH DIFFERENTIAL/PLATELET
BASOS PCT: 0.6 % (ref 0.0–3.0)
Basophils Absolute: 0 10*3/uL (ref 0.0–0.1)
Eosinophils Absolute: 0.1 10*3/uL (ref 0.0–0.7)
Eosinophils Relative: 2.1 % (ref 0.0–5.0)
HEMATOCRIT: 40.6 % (ref 36.0–46.0)
Hemoglobin: 13.6 g/dL (ref 12.0–15.0)
Lymphocytes Relative: 32.1 % (ref 12.0–46.0)
Lymphs Abs: 2 10*3/uL (ref 0.7–4.0)
MCHC: 33.6 g/dL (ref 30.0–36.0)
MCV: 91.7 fl (ref 78.0–100.0)
MONO ABS: 0.7 10*3/uL (ref 0.1–1.0)
Monocytes Relative: 10.8 % (ref 3.0–12.0)
NEUTROS ABS: 3.4 10*3/uL (ref 1.4–7.7)
NEUTROS PCT: 54.4 % (ref 43.0–77.0)
Platelets: 241 10*3/uL (ref 150.0–400.0)
RBC: 4.43 Mil/uL (ref 3.87–5.11)
RDW: 14.1 % (ref 11.5–15.5)
WBC: 6.2 10*3/uL (ref 4.0–10.5)

## 2015-01-29 LAB — COMPREHENSIVE METABOLIC PANEL
ALBUMIN: 4.3 g/dL (ref 3.5–5.2)
ALT: 13 U/L (ref 0–35)
AST: 17 U/L (ref 0–37)
Alkaline Phosphatase: 82 U/L (ref 39–117)
BILIRUBIN TOTAL: 0.5 mg/dL (ref 0.2–1.2)
BUN: 12 mg/dL (ref 6–23)
CO2: 25 meq/L (ref 19–32)
Calcium: 9.4 mg/dL (ref 8.4–10.5)
Chloride: 106 mEq/L (ref 96–112)
Creatinine, Ser: 0.74 mg/dL (ref 0.40–1.20)
GFR: 96.37 mL/min (ref >60.00)
GLUCOSE: 81 mg/dL (ref 70–99)
POTASSIUM: 4.2 meq/L (ref 3.5–5.1)
SODIUM: 139 meq/L (ref 135–145)
TOTAL PROTEIN: 6.7 g/dL (ref 6.0–8.3)

## 2015-01-29 LAB — TSH: TSH: 1.17 u[IU]/mL (ref 0.35–4.50)

## 2015-01-30 ENCOUNTER — Encounter: Payer: Self-pay | Admitting: Internal Medicine

## 2015-01-31 NOTE — Assessment & Plan Note (Signed)

## 2015-01-31 NOTE — Assessment & Plan Note (Signed)
I have addressed  BMI and recommended wt loss of 10% of body weigh over the next 6 months using a low glycemic index diet and regular exercise a minimum of 5 days per week.   Wt Readings from Last 3 Encounters:  01/28/15 186 lb 8 oz (84.596 kg)  10/07/13 173 lb 8 oz (78.699 kg)  07/26/13 173 lb 12 oz (78.812 kg)    Body mass index is 31.53 kg/(m^2).

## 2015-01-31 NOTE — Assessment & Plan Note (Signed)
She has had a prior neurology evaluation with no evidence of Parkinson's disease and is currently using labetalol

## 2015-02-16 ENCOUNTER — Telehealth: Payer: Self-pay | Admitting: Obstetrics and Gynecology

## 2015-02-16 MED ORDER — METOCLOPRAMIDE HCL 10 MG PO TABS: 10.0000 mg | ORAL_TABLET | Freq: Three times a day (TID) | ORAL | Status: DC

## 2015-02-16 NOTE — Addendum Note (Signed)
Addended by: Jackquline Denmark on: 02/16/2015 11:07 AM   Modules accepted: Orders

## 2015-02-16 NOTE — Telephone Encounter (Signed)
Pt states she is having difficulty metting infants milk supply, doing as per protocol. Has also tried the Fenugreek OTC x2 weeks which did not work. Did mention at work she pumps 3 times while at work but is stressed and she plays catch up all week. Encouraged pt to contact the lactation dept and will call in rx for Reglan  tid x14days, no refills per protocol. Pt advised to watch for depression while taking this medication.

## 2015-02-16 NOTE — Telephone Encounter (Signed)
Pt called and she has noticed that her breast mile supply is decreasing and wanted to know if there is something that can be called in to increase her milk supply.

## 2015-02-16 NOTE — Telephone Encounter (Signed)
Pt called and she has noticed that her breast milk supply is dropping and she wanted to know if there is something that could be called in to help increase it

## 2015-02-26 ENCOUNTER — Telehealth: Payer: Self-pay | Admitting: Obstetrics and Gynecology

## 2015-02-26 NOTE — Telephone Encounter (Signed)
PT CALLED AND SHE IS BREAST FEEDING, AND SHE IS STILL TAKING THE PRENATL VITAMINS BUT SHE IS GETTING CONSTIPATED AN WANTED TO KNOW IF THERE IS SOMETHING ELSE SHE CAN TAKE, SHE IS TAKING RIGHT NOW THE PNV PRENATL PLUS MULTIVITAMIN, PT WOULD LIKE CALL BACK

## 2015-02-26 NOTE — Telephone Encounter (Signed)
Pt informed that fresh fruits, vegetables, increase po fluids and may heat some prune juice with a little coffee (hot beverage). Pt states she is taking 2 stool softners a day and constipation was not real bad. Was informed that a dulcolax supp. May help. To contact office if any concerns.

## 2015-03-04 ENCOUNTER — Encounter: Payer: Self-pay | Admitting: Internal Medicine

## 2015-03-19 ENCOUNTER — Other Ambulatory Visit: Payer: Self-pay | Admitting: Internal Medicine

## 2015-03-25 ENCOUNTER — Encounter: Payer: Self-pay | Admitting: Obstetrics and Gynecology

## 2015-04-14 ENCOUNTER — Ambulatory Visit (INDEPENDENT_AMBULATORY_CARE_PROVIDER_SITE_OTHER): Payer: 59 | Admitting: Obstetrics and Gynecology

## 2015-04-14 ENCOUNTER — Encounter: Payer: Self-pay | Admitting: Obstetrics and Gynecology

## 2015-04-14 VITALS — BP 110/72 | HR 81 | Temp 98.4°F | Resp 14 | Ht 64.5 in | Wt 192.2 lb

## 2015-04-14 DIAGNOSIS — N912 Amenorrhea, unspecified: Secondary | ICD-10-CM | POA: Diagnosis not present

## 2015-04-14 DIAGNOSIS — Z23 Encounter for immunization: Secondary | ICD-10-CM | POA: Diagnosis not present

## 2015-04-14 LAB — POCT URINALYSIS DIP (MANUAL ENTRY)
Blood, UA: NEGATIVE
GLUCOSE UA: NEGATIVE
Leukocytes, UA: NEGATIVE
Nitrite, UA: NEGATIVE
Protein Ur, POC: NEGATIVE
Spec Grav, UA: >=1.03
Urobilinogen, UA: 0.2
pH, UA: 6

## 2015-04-14 LAB — POCT URINE PREGNANCY: Preg Test, Ur: NEGATIVE

## 2015-04-14 NOTE — Patient Instructions (Signed)

## 2015-04-14 NOTE — Progress Notes (Signed)
Subjective:    Brianna Washington is a 34 y.o. P37 female who presents for an annual exam. The patient has no complaints today. Concerned that she has not had a menstrual cycle since the birth of her daughter 6 months ago. The patient is sexually active. GYN screening history: last pap: approximate date 04/2013 and was normal. The patient wears seatbelts: yes. The patient participates in regular exercise: no. Has the patient ever been transfused or tattooed?: no. The patient reports that there is not domestic violence in her life.   Menstrual History: Obstetric History   G1   P1   T1   P0   A0   TAB0   SAB0   E0   M0   L2     # Outcome Date GA Lbr Len/2nd Weight Sex Delivery Anes PTL Lv  1 Term 08/21/14 [redacted]w[redacted]d  6 lb 6 oz (2.892 kg) F Vag-Spont EPI N Y     Apgar1:  8                Apgar5: 64      Menarche age: 35 No LMP recorded.   Patient currently breastfeeding.  Denies h/o abnormal pap smears or STIs.    Past Medical History  Diagnosis Date  . Allergy   . Tremors of nervous system     Essential Tremors    Past Surgical History  Procedure Laterality Date  . Tonsillectomy  2010    Family History  Problem Relation Age of Onset  . Hyperlipidemia Mother   . Hypertension Mother   . Hyperthyroidism Mother   . Hyperlipidemia Father   . Cancer Maternal Grandmother 78    metastatic breast ca   . Heart attack Maternal Grandfather   . Heart disease Maternal Grandfather     AMI  . Cancer Paternal Grandmother     NOn-Hodgkins Lymphoma  . Diabetes Paternal Grandmother   . Heart disease Paternal Grandfather     Social History   Social History  . Marital Status: Married    Spouse Name: N/A  . Number of Children: N/A  . Years of Education: N/A   Occupational History  . Not on file.   Social History Main Topics  . Smoking status: Former Smoker -- 0.25 packs/day    Types: Cigarettes    Quit date: 08/18/2012  . Smokeless tobacco: Not on file  . Alcohol Use: Yes  . Drug  Use: No  . Sexual Activity: Yes    Birth Control/ Protection: IUD     Comment: quit for 2 years once , has tried Chantix 6 months    Other Topics Concern  . Not on file   Social History Narrative   Current Outpatient Prescriptions on File Prior to Visit  Medication Sig Dispense Refill  . cetirizine (ZYRTEC) 10 MG tablet Take 10 mg by mouth daily.    Marland Kitchen labetalol (NORMODYNE) 100 MG tablet TAKE 1 TABLET BY MOUTH TWICE DAILY 60 tablet 5  . loratadine (CLARITIN) 10 MG tablet Take 10 mg by mouth daily.    . metoCLOPramide (REGLAN) 10 MG tablet Take 1 tablet (10 mg total) by mouth 3 (three) times daily before meals. 42 tablet 0  . norethindrone (MICRONOR,CAMILA,ERRIN) 0.35 MG tablet Take 1 tablet by mouth daily.    . vitamin C (ASCORBIC ACID) 500 MG tablet Take 500 mg by mouth daily.    . Azelastine-Fluticasone (DYMISTA) 137-50 MCG/ACT SUSP Place 2 Squirts into the nose daily. In each nostril (Patient  not taking: Reported on 01/28/2015) 23 g 11  . docusate sodium (COLACE) 100 MG capsule Take 100 mg by mouth 2 (two) times daily.    . Prenatal Vit-Fe Fumarate-FA (PRENATAL VITAMINS PLUS) 27-1 MG TABS Take 1 tablet by mouth daily.     No current facility-administered medications on file prior to visit.    No Known Allergies   Review of Systems Constitutional: negative for chills, fatigue, fevers and sweats Eyes: negative for irritation, redness and visual disturbance Ears, nose, mouth, throat, and face: negative for hearing loss, nasal congestion, snoring and tinnitus Respiratory: negative for asthma, cough, sputum Cardiovascular: negative for chest pain, dyspnea, exertional chest pressure/discomfort, irregular heart beat, palpitations and syncope Gastrointestinal: negative for abdominal pain, change in bowel habits, nausea and vomiting Genitourinary: positive for amenorrhea; negative for genital lesions, sexual problems and vaginal discharge, dysuria and urinary  incontinence Integument/breast: negative for breast lump, breast tenderness and nipple discharge Hematologic/lymphatic: negative for bleeding and easy bruising Musculoskeletal:negative for back pain and muscle weakness Neurological: negative for dizziness, headaches, vertigo and weakness Endocrine: negative for diabetic symptoms including polydipsia, polyuria and skin dryness Allergic/Immunologic: negative for hay fever and urticaria    Objective:    BP 110/72 mmHg  Pulse 81  Temp(Src) 98.4 F (36.9 C) (Oral)  Resp 14  Ht 5' 4.5" (1.638 m)  Wt 192 lb 3.4 oz (87.186 kg)  BMI 32.50 kg/m2   General Appearance:    Alert, cooperative, no distress, appears stated age  Head:    Normocephalic, without obvious abnormality, atraumatic  Eyes:    PERRL, conjunctiva/corneas clear, EOM's intact, both eyes  Ears:    Normal external ear canals, both ears  Nose:   Nares normal, septum midline, mucosa normal, no drainage or sinus tenderness  Throat:   Lips, mucosa, and tongue normal; teeth and gums normal  Neck:   Supple, symmetrical, trachea midline, no adenopathy; thyroid: no enlargement/tenderness/nodules; no carotid bruit or JVD  Back:     Symmetric, no curvature, ROM normal, no CVA tenderness  Lungs:     Clear to auscultation bilaterally, respirations unlabored  Chest Wall:    No tenderness or deformity   Heart:    Regular rate and rhythm, S1 and S2 normal, no murmur, rub or gallop  Breast Exam:    No tenderness, masses, or nipple abnormality  Abdomen:     Soft, non-tender, bowel sounds active all four quadrants, no masses, no organomegaly.    Genitalia:    Pelvic:external genitalia normal, vagina with lesions, discharge, or tenderness, rectovaginal septum  normal. Cervix normal in appearance, no cervical motion tenderness, no adnexal masses or tenderness. Uterus normal shape, size, and consistency.   Rectal:    Normal external sphincter.  No hemorrhoids appreciated. Internal exam not done.    Extremities:   Extremities normal, atraumatic, no cyanosis or edema  Pulses:   2+ and symmetric all extremities  Skin:   Skin color, texture, turgor normal, no rashes or lesions  Lymph nodes:   Cervical, supraclavicular, and axillary nodes normal  Neurologic:   CNII-XII intact, normal strength, sensation and reflexes throughout      Assessment:   Healthy female exam.   Amenorrhea Obesity, Class I   Plan:     Blood tests: Prolactin hormone level for amenorrhea. Has been 6 months since delivery. Could be secondary to medications (Reglan, OCPs) as well as fact that patient is still breastfeeding.  TSH recently performed, wnl.  Pregnancy test, result: negative.  If does not  resume once patient discontinues breastfeeding and Reglan, will undergo further workup. Discussed healthy lifestyle modifications, including healthy food choices and exercise regimen. Has had annual labs drawn earlier this year with PCP.  Contraception: progesterone OCPs.  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  Follow up in 1 year, or sooner as needed.     Hildred Laser, MD [facility]

## 2015-04-15 LAB — PROLACTIN: PROLACTIN: 124 ng/mL — AB (ref 4.8–23.3)

## 2015-04-16 ENCOUNTER — Other Ambulatory Visit: Payer: Self-pay | Admitting: Obstetrics and Gynecology

## 2015-04-16 DIAGNOSIS — R7989 Other specified abnormal findings of blood chemistry: Secondary | ICD-10-CM

## 2015-04-16 DIAGNOSIS — E229 Hyperfunction of pituitary gland, unspecified: Principal | ICD-10-CM

## 2015-04-16 DIAGNOSIS — N912 Amenorrhea, unspecified: Secondary | ICD-10-CM

## 2015-05-13 ENCOUNTER — Other Ambulatory Visit: Payer: 59

## 2015-05-13 DIAGNOSIS — N912 Amenorrhea, unspecified: Secondary | ICD-10-CM

## 2015-05-13 DIAGNOSIS — R7989 Other specified abnormal findings of blood chemistry: Secondary | ICD-10-CM

## 2015-05-13 DIAGNOSIS — E229 Hyperfunction of pituitary gland, unspecified: Principal | ICD-10-CM

## 2015-05-14 LAB — PROLACTIN: Prolactin: 5.3 ng/mL (ref 4.8–23.3)

## 2015-05-15 ENCOUNTER — Telehealth: Payer: Self-pay

## 2015-05-15 NOTE — Telephone Encounter (Signed)
-----   Message from Brianna LaserAnika Cherry, MD sent at 05/14/2015  9:43 PM EDT ----- Please inform patient that prolactin level has returned to normal.

## 2015-05-15 NOTE — Telephone Encounter (Signed)
LM for pt informing her of results below.

## 2015-07-27 ENCOUNTER — Encounter: Payer: Self-pay | Admitting: Obstetrics and Gynecology

## 2015-07-29 ENCOUNTER — Other Ambulatory Visit: Payer: Self-pay

## 2015-07-29 DIAGNOSIS — Z3041 Encounter for surveillance of contraceptive pills: Secondary | ICD-10-CM

## 2015-07-29 MED ORDER — NORETHIN ACE-ETH ESTRAD-FE 1-20 MG-MCG PO TABS: 1.0000 | ORAL_TABLET | Freq: Every day | ORAL | Status: DC

## 2015-07-29 NOTE — Telephone Encounter (Signed)
Pt calls and states that she is no longer breastfeeding and would like to initiate another form of contraception, after speaking with Dr.Cherry pt is to begin Junel Fe and come in to discuss smoking cessation.

## 2015-07-30 ENCOUNTER — Ambulatory Visit: Payer: 59 | Admitting: Family Medicine

## 2015-07-30 ENCOUNTER — Telehealth: Payer: Self-pay

## 2015-07-30 NOTE — Telephone Encounter (Signed)
Pt is trying to quit smoking and wants to try Chantix. She has appointment with Dr. Adriana Simas today at 2:00pm/tvw

## 2015-08-13 ENCOUNTER — Encounter: Payer: Self-pay | Admitting: Obstetrics and Gynecology

## 2015-08-13 ENCOUNTER — Ambulatory Visit (INDEPENDENT_AMBULATORY_CARE_PROVIDER_SITE_OTHER): Payer: 59 | Admitting: Obstetrics and Gynecology

## 2015-08-13 VITALS — BP 112/66 | HR 102 | Ht 64.5 in | Wt 190.1 lb

## 2015-08-13 DIAGNOSIS — Z720 Tobacco use: Secondary | ICD-10-CM | POA: Diagnosis not present

## 2015-08-13 DIAGNOSIS — Z716 Tobacco abuse counseling: Secondary | ICD-10-CM

## 2015-08-13 MED ORDER — VARENICLINE TARTRATE 0.5 MG PO TABS: 0.5000 mg | ORAL_TABLET | Freq: Two times a day (BID) | ORAL | Status: DC

## 2015-08-13 MED ORDER — VARENICLINE TARTRATE 1 MG PO TABS: 1.0000 mg | ORAL_TABLET | Freq: Two times a day (BID) | ORAL | Status: DC

## 2015-08-13 NOTE — Progress Notes (Signed)
   GYNECOLOGY CLINIC PROGRESS NOTE   Subjective:     LIZMARIE WITTERS is a 35 y.o. G85P1001 female here for a discussion regarding smoking cessation. She began smoking 10 months ago. She currently smokes 6 cigarettes per day. She has attempted to quit smoking in the past, most recently ~ 2 years ago (just prior to pregnancy). Best success quitting using Chantix. Barriers to quitting include: fear of failing again and spouse/partner smokes. She denies chest pain, dyspnea on exertion, non productive cough, shortness of breath and wheezing.  The following portions of the patient's history were reviewed and updated as appropriate: allergies, current medications, past family history, past medical history, past social history, past surgical history and problem list.  Review of Systems Pertinent items noted in HPI and remainder of comprehensive ROS otherwise negative.    Objective:  Blood pressure 112/66, pulse 102, height 5' 4.5" (1.638 m), weight 190 lb 1.6 oz (86.229 kg), last menstrual period 07/28/2015, not currently breastfeeding.   No exam performed today.   Assessment:    Tobacco Use/Cessation.  I assessed DELTHA BERNALES to be in an action stage with respect to tobacco use.    Plan:    Discussed current use pattern. Asked patient to inform me when they set a quit date. Brochure to patient. Quit date set for 08/21/2015 (daughter's birthday). Reviewed strategies to maximize success. Commended for decision.  Notes husband will be participating in smoking cessation as well.  Patient desires to begin Chantix.  Will prescribe.  Follow up in 4 weeks or as needed.  I spent approximately 15 minutes counseling the patient.     Hildred Laser, MD [facility]

## 2015-09-03 ENCOUNTER — Ambulatory Visit: Payer: 59 | Admitting: Obstetrics and Gynecology

## 2016-02-24 ENCOUNTER — Ambulatory Visit (INDEPENDENT_AMBULATORY_CARE_PROVIDER_SITE_OTHER): Payer: 59 | Admitting: Family Medicine

## 2016-02-24 ENCOUNTER — Encounter: Payer: Self-pay | Admitting: Family Medicine

## 2016-02-24 VITALS — BP 110/76 | HR 113 | Temp 100.0°F | Wt 179.0 lb

## 2016-02-24 DIAGNOSIS — J988 Other specified respiratory disorders: Secondary | ICD-10-CM | POA: Diagnosis not present

## 2016-02-24 DIAGNOSIS — J22 Unspecified acute lower respiratory infection: Secondary | ICD-10-CM

## 2016-02-24 LAB — POCT INFLUENZA A/B
INFLUENZA B, POC: NEGATIVE
Influenza A, POC: NEGATIVE

## 2016-02-24 MED ORDER — AZITHROMYCIN 250 MG PO TABS: ORAL_TABLET | ORAL | 0 refills | Status: DC

## 2016-02-24 NOTE — Progress Notes (Signed)
   Subjective:  Patient ID: Brianna Washington, female    DOB: [birth year]  Age: 35 y.o. MRN: [id number]  CC: Cough, congestion, fever  HPI:  35 year old female presents with the above complaints.  Patient states that she's been sick for the past 2 weeks. She's had cough and congestion. Patient states that she thought she had a virus but then developed fever on Sunday. She's had intermittent fevers since then (Tmax 102). No medications or interventions tried. No exacerbating or relieving factors. Additionally, patient states that she has also developed myalgias since she's had fever. No other complaints at this time.  Social Hx   Social History   Social History  . Marital status: Married    Spouse name: N/A  . Number of children: N/A  . Years of education: N/A   Social History Main Topics  . Smoking status: Current Every Day Smoker    Packs/day: 0.25    Types: Cigarettes    Last attempt to quit: 08/18/2012  . Smokeless tobacco: None  . Alcohol use Yes  . Drug use: No  . Sexual activity: Yes    Birth control/ protection: IUD   Other Topics Concern  . None   Social History Narrative  . None   Review of Systems  Constitutional: Positive for chills and fever.  HENT: Positive for congestion.   Respiratory: Positive for cough.   Musculoskeletal: Positive for myalgias.   Objective:  BP 110/76 (BP Location: Left Arm, Patient Position: Sitting, Cuff Size: Normal)   Pulse (!) 113   Temp 100 F (37.8 C) (Oral)   Wt 179 lb (81.2 kg)   SpO2 98%   BMI 30.25 kg/m   BP/Weight 02/24/2016 08/13/2015 04/14/2015  Systolic BP 110 112 110  Diastolic BP 76 66 72  Wt. (Lbs) 179 190.1 192.21  BMI 30.25 32.14 32.5    Physical Exam  Lab Results  Component Value Date   WBC 6.2 01/28/2015   HGB 13.6 01/28/2015   HCT 40.6 01/28/2015   PLT 241.0 01/28/2015   GLUCOSE 81 01/28/2015   CHOL 201 (H) 07/26/2013   TRIG 121.0 07/26/2013   HDL 46.50 07/26/2013   LDLDIRECT 147.5 07/26/2013   LDLCALC 77 08/02/2012   ALT 13 01/28/2015   AST 17 01/28/2015   NA 139 01/28/2015   K 4.2 01/28/2015   CL 106 01/28/2015   CREATININE 0.74 01/28/2015   BUN 12 01/28/2015   CO2 25 01/28/2015   TSH 1.17 01/28/2015    Assessment & Plan:   Problem List Items Addressed This Visit    Lower respiratory infection - Primary    New problem. Flu negative. Given worsening symptoms and development of fever (as well as duration of illness), treating empirically with Azithromycin to cover CAP.       Relevant Medications   azithromycin (ZITHROMAX) 250 MG tablet   Other Relevant Orders   POCT Influenza A/B (Completed)    Other Visit Diagnoses   None.     Meds ordered this encounter  Medications  . azithromycin (ZITHROMAX) 250 MG tablet    Sig: 2 tablets on Day 1, then 1 tablet daily on Days 2-5.    Dispense:  6 tablet    Refill:  0   Follow-up: PRN  Everlene OtherJayce Royal Vandevoort DO [facility] [place]

## 2016-02-24 NOTE — Progress Notes (Signed)
Pre visit review using our clinic review tool, if applicable. No additional management support is needed unless otherwise documented below in the visit note. 

## 2016-02-24 NOTE — Patient Instructions (Signed)
Take the antibiotic as prescribed.  Follow up if you fail to improve or worsen.  Take care  Dr. Kha Hari 

## 2016-02-24 NOTE — Assessment & Plan Note (Signed)
New problem. Flu negative. Given worsening symptoms and development of fever (as well as duration of illness), treating empirically with Azithromycin to cover CAP.

## 2016-02-29 ENCOUNTER — Encounter: Payer: Self-pay | Admitting: Internal Medicine

## 2016-02-29 ENCOUNTER — Ambulatory Visit (INDEPENDENT_AMBULATORY_CARE_PROVIDER_SITE_OTHER): Payer: 59 | Admitting: Internal Medicine

## 2016-02-29 VITALS — BP 114/64 | HR 88 | Temp 98.4°F | Resp 12 | Ht 65.0 in | Wt 180.2 lb

## 2016-02-29 DIAGNOSIS — E66811 Obesity, class 1: Secondary | ICD-10-CM

## 2016-02-29 DIAGNOSIS — E669 Obesity, unspecified: Secondary | ICD-10-CM | POA: Diagnosis not present

## 2016-02-29 DIAGNOSIS — F329 Major depressive disorder, single episode, unspecified: Secondary | ICD-10-CM

## 2016-02-29 DIAGNOSIS — Z0000 Encounter for general adult medical examination without abnormal findings: Secondary | ICD-10-CM | POA: Diagnosis not present

## 2016-02-29 DIAGNOSIS — R5383 Other fatigue: Secondary | ICD-10-CM

## 2016-02-29 DIAGNOSIS — F32A Depression, unspecified: Secondary | ICD-10-CM

## 2016-02-29 LAB — CBC WITH DIFFERENTIAL/PLATELET
BASOS ABS: 0 10*3/uL (ref 0.0–0.1)
Basophils Relative: 0.4 % (ref 0.0–3.0)
EOS ABS: 0.1 10*3/uL (ref 0.0–0.7)
Eosinophils Relative: 1.4 % (ref 0.0–5.0)
HCT: 39.8 % (ref 36.0–46.0)
Hemoglobin: 13.5 g/dL (ref 12.0–15.0)
LYMPHS ABS: 2.2 10*3/uL (ref 0.7–4.0)
Lymphocytes Relative: 38.6 % (ref 12.0–46.0)
MCHC: 33.9 g/dL (ref 30.0–36.0)
MCV: 91.2 fl (ref 78.0–100.0)
Monocytes Absolute: 0.6 10*3/uL (ref 0.1–1.0)
Monocytes Relative: 10.2 % (ref 3.0–12.0)
NEUTROS ABS: 2.8 10*3/uL (ref 1.4–7.7)
NEUTROS PCT: 49.4 % (ref 43.0–77.0)
PLATELETS: 260 10*3/uL (ref 150.0–400.0)
RBC: 4.37 Mil/uL (ref 3.87–5.11)
RDW: 13.8 % (ref 11.5–15.5)
WBC: 5.6 10*3/uL (ref 4.0–10.5)

## 2016-02-29 LAB — COMPREHENSIVE METABOLIC PANEL
ALT: 14 U/L (ref 0–35)
AST: 17 U/L (ref 0–37)
Albumin: 4.1 g/dL (ref 3.5–5.2)
Alkaline Phosphatase: 56 U/L (ref 39–117)
BILIRUBIN TOTAL: 0.3 mg/dL (ref 0.2–1.2)
BUN: 10 mg/dL (ref 6–23)
CO2: 26 meq/L (ref 19–32)
CREATININE: 0.75 mg/dL (ref 0.40–1.20)
Calcium: 9 mg/dL (ref 8.4–10.5)
Chloride: 104 mEq/L (ref 96–112)
GFR: 94.26 mL/min (ref >60.00)
GLUCOSE: 88 mg/dL (ref 70–99)
Potassium: 3.8 mEq/L (ref 3.5–5.1)
SODIUM: 138 meq/L (ref 135–145)
Total Protein: 7.1 g/dL (ref 6.0–8.3)

## 2016-02-29 LAB — VITAMIN B12: Vitamin B-12: 560 pg/mL (ref 211–911)

## 2016-02-29 LAB — TSH: TSH: 0.92 u[IU]/mL (ref 0.35–4.50)

## 2016-02-29 MED ORDER — CITALOPRAM HYDROBROMIDE 20 MG PO TABS: 20.0000 mg | ORAL_TABLET | Freq: Every day | ORAL | 1 refills | Status: DC

## 2016-02-29 NOTE — Progress Notes (Signed)
Pre-visit discussion using our clinic review tool. No additional management support is needed unless otherwise documented below in the visit note.  

## 2016-02-29 NOTE — Progress Notes (Signed)
Patient ID: Brianna Washington, female    DOB: [birth year]  Age: 35 y.o. MRN: [id number]  The patient is here for annual  Non gyn wellness examination and management of other chronic and acute problems.    Has had her PAP smear in October by GYN.  No prior mammogram.  No FH.     The risk factors are reflected in the social history.  The roster of all physicians providing medical care to patient - is listed in the Snapshot section of the chart. \  Home safety : The patient has smoke detectors in the home. They wear seatbelts.  There are no firearms at home. There is no violence in the home.   There is no risks for hepatitis, STDs or HIV. There is no   history of blood transfusion. They have no travel history to infectious disease endemic areas of the world.  The patient has seen their dentist in the last six month. They have seen their eye doctor in the last year. .  They do not  have excessive sun exposure. Discussed the need for sun protection: hats, long sleeves and use of sunscreen if there is significant sun exposure.   Diet: the importance of a healthy diet is discussed. They do have a healthy diet.  The benefits of regular aerobic exercise were discussed. She walks 4 times per week ,  20 minutes.   Depression screen: there are no signs or vegative symptoms of depression- irritability, change in appetite, anhedonia, sadness/tearfullness.  The following portions of the patient's history were reviewed and updated as appropriate: allergies, current medications, past family history, past medical history,  past surgical history, past social history  and problem list.  Visual acuity was not assessed per patient preference since she has regular follow up with her ophthalmologist. Hearing and body mass index were assessed and reviewed.    CC: The primary encounter diagnosis was Fatigue due to depression. Diagnoses of Obesity (BMI 30.0-34.9) and Visit for preventive health examination were  also pertinent to this visit.  Last seen a year ago for fatigue.  Has lost 12 lb this year. Sleeping better,  Trying to exercise. Treated for PNA last week last dose of antibiotic yesterday,  Not takign a probiotic, has loose stools last one yesterday   .  Cough nonproductive and  throat feels irritated, but he rfevers have resolved.  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   Discussed therapy   Still sleepy all the time.     History Brianna Washington has a past medical history of Allergy; Hypertension; and Tremors of nervous system.   She has a past surgical history that includes Tonsillectomy (2010).   Her family history includes Cancer in her paternal grandmother; Cancer (age of onset: 2955) in her maternal grandmother; Diabetes in her paternal grandmother; Heart attack in her maternal grandfather; Heart disease in her maternal grandfather and paternal grandfather; Hyperlipidemia in her father and mother; Hypertension in her mother; Hyperthyroidism in her mother.She reports that she quit smoking about 3 years ago. Her smoking use included Cigarettes. She smoked 0.25 packs per day. She does not have any smokeless tobacco history on file. She reports that she drinks alcohol. She reports that she does not use drugs.  Outpatient Medications Prior to Visit  Medication Sig Dispense Refill  . labetalol (NORMODYNE) 100 MG tablet TAKE 1 TABLET BY MOUTH  TWICE DAILY 60 tablet 5  . loratadine (CLARITIN) 10 MG tablet Take 10 mg by mouth daily.    Marland Kitchen. azithromycin (ZITHROMAX) 250 MG tablet 2 tablets on Day 1, then 1 tablet daily on Days 2-5. 6 tablet 0  . norethindrone-ethinyl estradiol (JUNEL FE 1/20) 1-20 MG-MCG tablet Take 1 tablet by mouth daily. (Patient not taking: Reported on 02/29/2016) 1 Package 11  . varenicline (CHANTIX CONTINUING MONTH PAK) 1 MG tablet Take 1 tablet (1 mg total) by mouth 2 (two)  times daily. Begin after initial dosing of 0.5 mg tablets. (Patient not taking: Reported on 02/29/2016) 60 tablet 3  . varenicline (CHANTIX) 0.5 MG tablet Take 1 tablet (0.5 mg total) by mouth 2 (two) times daily. Start with 1 tablet daily x 3 days, then increase to 1 tablet twice daily x 4 days. (Patient not taking: Reported on 02/29/2016) 11 tablet 0  . varenicline (CHANTIX) 0.5 MG tablet Take 1 tablet (0.5 mg total) by mouth 2 (two) times daily. Start with 1 tablet daily x 3 days, then increase to 1 tablet twice daily x 4 days. (Patient not taking: Reported on 02/29/2016) 11 tablet 0   No facility-administered medications prior to visit.     Review of Systems  Objective:  BP 114/64   Pulse 88   Temp 98.4 F (36.9 C) (Oral)   Resp 12   Ht 5\' 5"  (1.651 m)   Wt 180 lb 4 oz (81.8 kg)   LMP 02/11/2016 (Approximate)   SpO2 98%   BMI 30.00 kg/m   Physical Exam    Assessment & Plan:   Problem List Items Addressed This Visit    Visit for preventive health examination    Annual comprehensive preventive exam was done as well as an evaluation and management of chronic conditions .  During the course of the visit the patient was educated and counseled about appropriate screening and preventive services including :  diabetes screening, lipid analysis with projected  10 year  risk for CAD , nutrition counseling, breast, cervical and colorectal cancer screening, and recommended immunizations.  Printed recommendations for health maintenance screenings was give      Obesity (BMI 30.0-34.9)    I have congratulated her in reduction of   BMI and encouraged  Continued weight loss with goal of 10% of body weigh over the next 6 months using a low glycemic index diet and regular exercise a minimum of 5 days per week.        Fatigue due to depression - Primary    Screening for B12, thyroid deficiencies and anemia were negative.  Trial of citalopram  Lab Results  Component Value Date   VITAMINB12 560  02/29/2016   Lab Results  Component Value Date   TSH 0.92 02/29/2016   Lab Results  Component Value Date   WBC 5.6 02/29/2016   HGB 13.5 02/29/2016   HCT 39.8 02/29/2016   MCV 91.2 02/29/2016   PLT 260.0 02/29/2016   Lab Results  Component Value Date   CREATININE 0.75 02/29/2016   Lab Results  Component Value Date   NA 138 02/29/2016   K 3.8 02/29/2016   CL 104 02/29/2016   CO2 26 02/29/2016         Relevant Orders   Vitamin B12 (Completed)   CBC with Differential/Platelet (Completed)   Comprehensive metabolic panel (Completed)   TSH (Completed)    Other Visit Diagnoses   None.     I have discontinued Ms. Eldred MangesMiles's  norethindrone-ethinyl estradiol, varenicline, varenicline, varenicline, and azithromycin. I am also having her start on citalopram. Additionally, I am having her maintain her loratadine and labetalol.  Meds ordered this encounter  Medications  . citalopram (CELEXA) 20 MG tablet    Sig: Take 1 tablet (20 mg total) by mouth daily.    Dispense:  90 tablet    Refill:  1    Medications Discontinued During This Encounter  Medication Reason  . azithromycin (ZITHROMAX) 250 MG tablet Completed Course  . varenicline (CHANTIX) 0.5 MG tablet Error  . varenicline (CHANTIX) 0.5 MG tablet Error  . varenicline (CHANTIX CONTINUING MONTH PAK) 1 MG tablet Error  . norethindrone-ethinyl estradiol (JUNEL FE 1/20) 1-20 MG-MCG tablet Patient Preference    Follow-up: No Follow-up on file.   Sherlene Shams, MD

## 2016-02-29 NOTE — Patient Instructions (Addendum)
Since you were on antibiotics and are having loose stools:  Please take a probiotic ( Align, Floraque or Culturelle), the generic version of one of these over the counter medications, or a PROBIOTIC BEVERAGE  (kombucha,  KEVITA DRINKS AVAILABLE IN THE REFRIGERATED VEGETABLE SECTION OF ANY GROCERY STORE ) Yogurt, or another dietary source) for a minimum of 3 weeks to prevent a serious antibiotic associated diarrhea  Called clostridium dificile colitis.  Taking a probiotic may also prevent vaginitis due to yeast infections and can be continued indefinitely if you feel that it improves your digestion or your elimination (bowels).  Schiff makes a really tasty once that is in a dark chocolate ball!!( [employer])     Trial of generic Celexa for your depression (citalopram).  Please start  citalopram) at the lower dose of  1/2 tablet daily in the evening for the first few days to avoid nausea.  You can increase to a full tablet after 4 days if you have not developed side effects of nausea.  If the Citalopram  interferes with your sleep, take it in the morning instead.  You can increase the dose to 40 mg after 2 weeks on the 20 mg dose   e mail me to let me know how it is helping your depression

## 2016-03-01 DIAGNOSIS — R5383 Other fatigue: Secondary | ICD-10-CM | POA: Insufficient documentation

## 2016-03-01 NOTE — Assessment & Plan Note (Signed)
Screening for B12, thyroid deficiencies and anemia were negative.  Trial of citalopram  Lab Results  Component Value Date   VITAMINB12 560 02/29/2016   Lab Results  Component Value Date   TSH 0.92 02/29/2016   Lab Results  Component Value Date   WBC 5.6 02/29/2016   HGB 13.5 02/29/2016   HCT 39.8 02/29/2016   MCV 91.2 02/29/2016   PLT 260.0 02/29/2016   Lab Results  Component Value Date   CREATININE 0.75 02/29/2016   Lab Results  Component Value Date   NA 138 02/29/2016   K 3.8 02/29/2016   CL 104 02/29/2016   CO2 26 02/29/2016

## 2016-03-01 NOTE — Assessment & Plan Note (Signed)
I have congratulated her in reduction of   BMI and encouraged  Continued weight loss with goal of 10% of body weigh over the next 6 months using a low glycemic index diet and regular exercise a minimum of 5 days per week.    

## 2016-03-01 NOTE — Assessment & Plan Note (Signed)
Annual comprehensive preventive exam was done as well as an evaluation and management of chronic conditions .  During the course of the visit the patient was educated and counseled about appropriate screening and preventive services including :  diabetes screening, lipid analysis with projected  10 year  risk for CAD , nutrition counseling, breast, cervical and colorectal cancer screening, and recommended immunizations.  Printed recommendations for health maintenance screenings was give 

## 2016-03-02 ENCOUNTER — Encounter: Payer: Self-pay | Admitting: Internal Medicine

## 2016-04-14 ENCOUNTER — Encounter: Payer: 59 | Admitting: Obstetrics and Gynecology

## 2016-04-20 ENCOUNTER — Ambulatory Visit: Payer: 59 | Admitting: Obstetrics and Gynecology

## 2016-04-21 ENCOUNTER — Ambulatory Visit (INDEPENDENT_AMBULATORY_CARE_PROVIDER_SITE_OTHER): Payer: 59 | Admitting: Obstetrics and Gynecology

## 2016-04-21 ENCOUNTER — Encounter: Payer: Self-pay | Admitting: Obstetrics and Gynecology

## 2016-04-21 ENCOUNTER — Other Ambulatory Visit: Payer: Self-pay | Admitting: Internal Medicine

## 2016-04-21 VITALS — BP 102/65 | HR 84 | Ht 64.0 in | Wt 176.4 lb

## 2016-04-21 DIAGNOSIS — Z3201 Encounter for pregnancy test, result positive: Secondary | ICD-10-CM

## 2016-04-21 DIAGNOSIS — Z87891 Personal history of nicotine dependence: Secondary | ICD-10-CM

## 2016-04-21 LAB — POCT URINE PREGNANCY: Preg Test, Ur: POSITIVE — AB

## 2016-04-21 NOTE — Progress Notes (Signed)
   GYNECOLOGY CLINIC PROGRESS NOTE  Subjective:    Brianna Washington is a 35 y.o. [id number] female who presents for evaluation of amenorrhea. She believes she could be pregnant. Pregnancy is desired. Sexual Activity: married, single partner, contraception: none. Current symptoms also include: breast tenderness, fatigue, nausea, positive home pregnancy test and cramping. Last period was normal. Patient's last menstrual period was 03/10/2016.    The following portions of the patient's history were reviewed and updated as appropriate: allergies, current medications, past family history, past medical history, past social history, past surgical history and problem list.  Review of Systems Pertinent items noted in HPI and remainder of comprehensive ROS otherwise negative.     Objective:    BP 102/65 (BP Location: Left Arm, Patient Position: Sitting, Cuff Size: Normal)   Pulse 84   Ht 5\' 4"  (1.626 m)   Wt 176 lb 6.4 oz (80 kg)   LMP 03/10/2016   Breastfeeding? No   BMI 30.28 kg/m  General: alert and no acute distress    Lab Review Urine HCG: positive    Assessment:    Absence of menstruation.    Pregnancy test positive  Plan:    Pregnancy Test: Positive: EDC: December 15, 2016. EGA 6.0 weeks.  Briefly discussed pre-natal care options. Pregnancy, Childbirth and the Newborn book given. Encouraged well-balanced diet, plenty of rest when needed, pre-natal vitamins daily and walking for exercise. Discussed self-help for nausea, avoiding OTC medications until consulting provider or pharmacist, other than Tylenol as needed, minimal caffeine (1-2 cups daily) and avoiding alcohol. She will schedule her initial OB visit in the next month with her PCP or OB provider. Feel free to call with any questions.     Hildred LaserAnika Chipper Koudelka, MD [facility]

## 2016-04-21 NOTE — Patient Instructions (Signed)
First Trimester of Pregnancy The first trimester of pregnancy is from week 1 until the end of week 12 (months 1 through 3). A week after a sperm fertilizes an egg, the egg will implant on the wall of the uterus. This embryo will begin to develop into a baby. Genes from you and your partner are forming the baby. The female genes determine whether the baby is a boy or a girl. At 6-8 weeks, the eyes and face are formed, and the heartbeat can be seen on ultrasound. At the end of 12 weeks, all the baby's organs are formed.  Now that you are pregnant, you will want to do everything you can to have a healthy baby. Two of the most important things are to get good prenatal care and to follow your health care provider's instructions. Prenatal care is all the medical care you receive before the baby's birth. This care will help prevent, find, and treat any problems during the pregnancy and childbirth. BODY CHANGES Your body goes through many changes during pregnancy. The changes vary from woman to woman.   You may gain or lose a couple of pounds at first.  You may feel sick to your stomach (nauseous) and throw up (vomit). If the vomiting is uncontrollable, call your health care provider.  You may tire easily.  You may develop headaches that can be relieved by medicines approved by your health care provider.  You may urinate more often. Painful urination may mean you have a bladder infection.  You may develop heartburn as a result of your pregnancy.  You may develop constipation because certain hormones are causing the muscles that push waste through your intestines to slow down.  You may develop hemorrhoids or swollen, bulging veins (varicose veins).  Your breasts may begin to grow larger and become tender. Your nipples may stick out more, and the tissue that surrounds them (areola) may become darker.  Your gums may bleed and may be sensitive to brushing and flossing.  Dark spots or blotches (chloasma,  mask of pregnancy) may develop on your face. This will likely fade after the baby is born.  Your menstrual periods will stop.  You may have a loss of appetite.  You may develop cravings for certain kinds of food.  You may have changes in your emotions from day to day, such as being excited to be pregnant or being concerned that something may go wrong with the pregnancy and baby.  You may have more vivid and strange dreams.  You may have changes in your hair. These can include thickening of your hair, rapid growth, and changes in texture. Some women also have hair loss during or after pregnancy, or hair that feels dry or thin. Your hair will most likely return to normal after your baby is born. WHAT TO EXPECT AT YOUR PRENATAL VISITS During a routine prenatal visit:  You will be weighed to make sure you and the baby are growing normally.  Your blood pressure will be taken.  Your abdomen will be measured to track your baby's growth.  The fetal heartbeat will be listened to starting around week 10 or 12 of your pregnancy.  Test results from any previous visits will be discussed. Your health care provider may ask you:  How you are feeling.  If you are feeling the baby move.  If you have had any abnormal symptoms, such as leaking fluid, bleeding, severe headaches, or abdominal cramping.  If you are using any tobacco products,   including cigarettes, chewing tobacco, and electronic cigarettes.  If you have any questions. Other tests that may be performed during your first trimester include:  Blood tests to find your blood type and to check for the presence of any previous infections. They will also be used to check for low iron levels (anemia) and Rh antibodies. Later in the pregnancy, blood tests for diabetes will be done along with other tests if problems develop.  Urine tests to check for infections, diabetes, or protein in the urine.  An ultrasound to confirm the proper growth  and development of the baby.  An amniocentesis to check for possible genetic problems.  Fetal screens for spina bifida and Down syndrome.  You may need other tests to make sure you and the baby are doing well.  HIV (human immunodeficiency virus) testing. Routine prenatal testing includes screening for HIV, unless you choose not to have this test. HOME CARE INSTRUCTIONS  Medicines  Follow your health care provider's instructions regarding medicine use. Specific medicines may be either safe or unsafe to take during pregnancy.  Take your prenatal vitamins as directed.  If you develop constipation, try taking a stool softener if your health care provider approves. Diet  Eat regular, well-balanced meals. Choose a variety of foods, such as meat or vegetable-based protein, fish, milk and low-fat dairy products, vegetables, fruits, and whole grain breads and cereals. Your health care provider will help you determine the amount of weight gain that is right for you.  Avoid raw meat and uncooked cheese. These carry germs that can cause birth defects in the baby.  Eating four or five small meals rather than three large meals a day may help relieve nausea and vomiting. If you start to feel nauseous, eating a few soda crackers can be helpful. Drinking liquids between meals instead of during meals also seems to help nausea and vomiting.  If you develop constipation, eat more high-fiber foods, such as fresh vegetables or fruit and whole grains. Drink enough fluids to keep your urine clear or pale yellow. Activity and Exercise  Exercise only as directed by your health care provider. Exercising will help you:  Control your weight.  Stay in shape.  Be prepared for labor and delivery.  Experiencing pain or cramping in the lower abdomen or low back is a good sign that you should stop exercising. Check with your health care provider before continuing normal exercises.  Try to avoid standing for long  periods of time. Move your legs often if you must stand in one place for a long time.  Avoid heavy lifting.  Wear low-heeled shoes, and practice good posture.  You may continue to have sex unless your health care provider directs you otherwise. Relief of Pain or Discomfort  Wear a good support bra for breast tenderness.   Take warm sitz baths to soothe any pain or discomfort caused by hemorrhoids. Use hemorrhoid cream if your health care provider approves.   Rest with your legs elevated if you have leg cramps or low back pain.  If you develop varicose veins in your legs, wear support hose. Elevate your feet for 15 minutes, 3-4 times a day. Limit salt in your diet. Prenatal Care  Schedule your prenatal visits by the twelfth week of pregnancy. They are usually scheduled monthly at first, then more often in the last 2 months before delivery.  Write down your questions. Take them to your prenatal visits.  Keep all your prenatal visits as directed by your   health care provider. Safety  Wear your seat belt at all times when driving.  Make a list of emergency phone numbers, including numbers for family, friends, the hospital, and police and fire departments. General Tips  Ask your health care provider for a referral to a local prenatal education class. Begin classes no later than at the beginning of month 6 of your pregnancy.  Ask for help if you have counseling or nutritional needs during pregnancy. Your health care provider can offer advice or refer you to specialists for help with various needs.  Do not use hot tubs, steam rooms, or saunas.  Do not douche or use tampons or scented sanitary pads.  Do not cross your legs for long periods of time.  Avoid cat litter boxes and soil used by cats. These carry germs that can cause birth defects in the baby and possibly loss of the fetus by miscarriage or stillbirth.  Avoid all smoking, herbs, alcohol, and medicines not prescribed by  your health care provider. Chemicals in these affect the formation and growth of the baby.  Do not use any tobacco products, including cigarettes, chewing tobacco, and electronic cigarettes. If you need help quitting, ask your health care provider. You may receive counseling support and other resources to help you quit.  Schedule a dentist appointment. At home, brush your teeth with a soft toothbrush and be gentle when you floss. SEEK MEDICAL CARE IF:   You have dizziness.  You have mild pelvic cramps, pelvic pressure, or nagging pain in the abdominal area.  You have persistent nausea, vomiting, or diarrhea.  You have a bad smelling vaginal discharge.  You have pain with urination.  You notice increased swelling in your face, hands, legs, or ankles. SEEK IMMEDIATE MEDICAL CARE IF:   You have a fever.  You are leaking fluid from your vagina.  You have spotting or bleeding from your vagina.  You have severe abdominal cramping or pain.  You have rapid weight gain or loss.  You vomit blood or material that looks like coffee grounds.  You are exposed to German measles and have never had them.  You are exposed to fifth disease or chickenpox.  You develop a severe headache.  You have shortness of breath.  You have any kind of trauma, such as from a fall or a car accident.   This information is not intended to replace advice given to you by your health care provider. Make sure you discuss any questions you have with your health care provider.   Document Released: 06/21/2001 Document Revised: 07/18/2014 Document Reviewed: 05/07/2013 [employer] Interactive Patient Education 2016 [employer]  

## 2016-05-02 DIAGNOSIS — H5213 Myopia, bilateral: Secondary | ICD-10-CM | POA: Diagnosis not present

## 2016-05-09 ENCOUNTER — Ambulatory Visit (INDEPENDENT_AMBULATORY_CARE_PROVIDER_SITE_OTHER): Payer: 59 | Admitting: Obstetrics and Gynecology

## 2016-05-09 VITALS — BP 101/56 | HR 84 | Wt 173.7 lb

## 2016-05-09 DIAGNOSIS — Z369 Encounter for antenatal screening, unspecified: Secondary | ICD-10-CM | POA: Diagnosis not present

## 2016-05-09 DIAGNOSIS — O280 Abnormal hematological finding on antenatal screening of mother: Secondary | ICD-10-CM | POA: Diagnosis not present

## 2016-05-09 DIAGNOSIS — R251 Tremor, unspecified: Secondary | ICD-10-CM | POA: Insufficient documentation

## 2016-05-09 DIAGNOSIS — Z331 Pregnant state, incidental: Secondary | ICD-10-CM | POA: Diagnosis not present

## 2016-05-09 DIAGNOSIS — R638 Other symptoms and signs concerning food and fluid intake: Secondary | ICD-10-CM | POA: Diagnosis not present

## 2016-05-09 DIAGNOSIS — Z113 Encounter for screening for infections with a predominantly sexual mode of transmission: Secondary | ICD-10-CM | POA: Diagnosis not present

## 2016-05-09 NOTE — Patient Instructions (Signed)
Pregnancy and Zika Virus Disease Zika virus disease, or Zika, is an illness that can spread to people from mosquitoes that carry the virus. It may also spread from person to person through infected body fluids. Zika first occurred in [place], but recently it has spread to new areas. The virus occurs in tropical climates. The location of Zika continues to change. Most people who become infected with Zika virus do not develop serious illness. However, Zika may cause birth defects in an unborn baby whose mother is infected with the virus. It may also increase the risk of miscarriage. WHAT ARE THE SYMPTOMS OF ZIKA VIRUS DISEASE? In many cases, people who have been infected with Zika virus do not develop any symptoms. If symptoms appear, they usually start about a week after the person is infected. Symptoms are usually mild. They may include:  Fever.  Rash.  Red eyes.  Joint pain. HOW DOES ZIKA VIRUS DISEASE SPREAD? The main way that Zika virus spreads is through the bite of a certain type of mosquito. Unlike most types of mosquitos, which bite only at night, the type of mosquito that carries Zika virus bites both at night and during the day. Zika virus can also spread through sexual contact, through a blood transfusion, and from a mother to her baby before or during birth. Once you have had Zika virus disease, it is unlikely that you will get it again. CAN I PASS ZIKA TO MY BABY DURING PREGNANCY? Yes, Zika can pass from a mother to her baby before or during birth. WHAT PROBLEMS CAN ZIKA CAUSE FOR MY BABY? A woman who is infected with Zika virus while pregnant is at risk of having her baby born with a condition in which the brain or head is smaller than expected (microcephaly). Babies who have microcephaly can have developmental delays, seizures, hearing problems, and vision problems. Having Zika virus disease during pregnancy can also increase the risk of miscarriage. HOW CAN ZIKA VIRUS DISEASE BE  PREVENTED? There is no vaccine to prevent Zika. The best way to prevent the disease is to avoid infected mosquitoes and avoid exposure to body fluids that can spread the virus. Avoid any possible exposure to Zika by taking the following precautions. For women and their sex partners:  Avoid traveling to high-risk areas. The locations where Zika is being reported change often. To identify high-risk areas, check the CDC travel website: www.cdc.gov/zika/geo/index.html  If you or your sex partner must travel to a high-risk area, talk with a health care provider before and after traveling.  Take all precautions to avoid mosquito bites if you live in, or travel to, any of the high-risk areas. Insect repellents are safe to use during pregnancy.  Ask your health care provider when it is safe to have sexual contact. For women:  If you are pregnant or trying to become pregnant, avoid sexual contact with persons who may have been exposed to Zika virus, persons who have possible symptoms of Zika, or persons whose history you are unsure about. If you choose to have sexual contact with someone who may have been exposed to Zika virus, use condoms correctly during the entire duration of sexual activity, every time. Do not share sexual devices, as you may be exposed to body fluids.  Ask your health care provider about when it is safe to attempt pregnancy after a possible exposure to Zika virus. WHAT STEPS SHOULD I TAKE TO AVOID MOSQUITO BITES? Take these steps to avoid mosquito bites when you are   in a high-risk area:  Wear loose clothing that covers your arms and legs.  Limit your outdoor activities.  Do not open windows unless they have window screens.  Sleep under mosquito nets.  Use insect repellent. The best insect repellents have:  DEET, picaridin, oil of lemon eucalyptus (OLE), or IR3535 in them.  Higher amounts of an active ingredient in them.  Remember that insect repellents are safe to use  during pregnancy.  Do not use OLE on children who are younger than 3 years of age. Do not use insect repellent on babies who are younger than 2 months of age.  Cover your child's stroller with mosquito netting. Make sure the netting fits snugly and that any loose netting does not cover your child's mouth or nose. Do not use a blanket as a mosquito-protection cover.  Do not apply insect repellent underneath clothing.  If you are using sunscreen, apply the sunscreen before applying the insect repellent.  Treat clothing with permethrin. Do not apply permethrin directly to your skin. Follow label directions for safe use.  Get rid of standing water, where mosquitoes may reproduce. Standing water is often found in items such as buckets, bowls, animal food dishes, and flowerpots. When you return from traveling to any high-risk area, continue taking actions to protect yourself against mosquito bites for 3 weeks, even if you show no signs of illness. This will prevent spreading Zika virus to uninfected mosquitoes. WHAT SHOULD I KNOW ABOUT THE SEXUAL TRANSMISSION OF ZIKA? People can spread Zika to their sexual partners during vaginal, anal, or oral sex, or by sharing sexual devices. Many people with Zika do not develop symptoms, so a person could spread the disease without knowing that they are infected. The greatest risk is to women who are pregnant or who may become pregnant. Zika virus can live longer in semen than it can live in blood. Couples can prevent sexual transmission of the virus by:  Using condoms correctly during the entire duration of sexual activity, every time. This includes vaginal, anal, and oral sex.  Not sharing sexual devices. Sharing increases your risk of being exposed to body fluid from another person.  Avoiding all sexual activity until your health care provider says it is safe. SHOULD I BE TESTED FOR ZIKA VIRUS? A sample of your blood can be tested for Zika virus. A pregnant  woman should be tested if she may have been exposed to the virus or if she has symptoms of Zika. She may also have additional tests done during her pregnancy, such ultrasound testing. Talk with your health care provider about which tests are recommended.   This information is not intended to replace advice given to you by your health care provider. Make sure you discuss any questions you have with your health care provider.   Document Released: 03/18/2015 Document Reviewed: 03/11/2015 Elsevier Interactive Patient Education 2016 [employer] Minor Illnesses and Medications in Pregnancy  Cold/Flu:  Sudafed for congestion- Robitussin (plain) for cough- Tylenol for discomfort.  Please follow the directions on the label.  Try not to take any more than needed.  OTC Saline nasal spray and air humidifier or cool-mist  Vaporizer to sooth nasal irritation and to loosen congestion.  It is also important to increase intake of non carbonated fluids, especially if you have a fever.  Constipation:  Colace-2 capsules at bedtime; Metamucil- follow directions on label; Senokot- 1 tablet at bedtime.  Any one of these medications can be used.  It is also   very important to increase fluids and fruits along with regular exercise.  If problem persists please call the office.  Diarrhea:  Kaopectate as directed on the label.  Eat a bland diet and increase fluids.  Avoid highly seasoned foods.  Headache:  Tylenol 1 or 2 tablets every 3-4 hours as needed  Indigestion:  Maalox, Mylanta, Tums or Rolaids- as directed on label.  Also try to eat small meals and avoid fatty, greasy or spicy foods.  Nausea with or without Vomiting:  Nausea in pregnancy is caused by increased levels of hormones in the body which influence the digestive system and cause irritation when stomach acids accumulate.  Symptoms usually subside after 1st trimester of pregnancy.  Try the following: 1. Keep saltines, graham crackers or dry toast by your bed  to eat upon awakening. 2. Don't let your stomach get empty.  Try to eat 5-6 small meals per day instead of 3 large ones. 3. Avoid greasy fatty or highly seasoned foods.  4. Take OTC Unisom 1 tablet at bed time along with OTC Vitamin B6 25-50 mg 3 times per day.    If nausea continues with vomiting and you are unable to keep down food and fluids you may need a prescription medication.  Please notify your provider.   Sore throat:  Chloraseptic spray, throat lozenges and or plain Tylenol.  Vaginal Yeast Infection:  OTC Monistat for 7 days as directed on label.  If symptoms do not resolve within a week notify provider.  If any of the above problems do not subside with recommended treatment please call the office for further assistance.   Do not take Aspirin, Advil, Motrin or Ibuprofen.  * * OTC= Over the counter Hyperemesis Gravidarum Hyperemesis gravidarum is a severe form of nausea and vomiting that happens during pregnancy. Hyperemesis is worse than morning sickness. It may cause you to have nausea or vomiting all day for many days. It may keep you from eating and drinking enough food and liquids. Hyperemesis usually occurs during the first half (the first 20 weeks) of pregnancy. It often goes away once a woman is in her second half of pregnancy. However, sometimes hyperemesis continues through an entire pregnancy.  CAUSES  The cause of this condition is not completely known but is thought to be related to changes in the body's hormones when pregnant. It could be from the high level of the pregnancy hormone or an increase in estrogen in the body.  SIGNS AND SYMPTOMS   Severe nausea and vomiting.  Nausea that does not go away.  Vomiting that does not allow you to keep any food down.  Weight loss and body fluid loss (dehydration).  Having no desire to eat or not liking food you have previously enjoyed. DIAGNOSIS  Your health care provider will do a physical exam and ask you about your  symptoms. He or she may also order blood tests and urine tests to make sure something else is not causing the problem.  TREATMENT  You may only need medicine to control the problem. If medicines do not control the nausea and vomiting, you will be treated in the hospital to prevent dehydration, increased acid in the blood (acidosis), weight loss, and changes in the electrolytes in your body that may harm the unborn baby (fetus). You may need IV fluids.  HOME CARE INSTRUCTIONS   Only take over-the-counter or prescription medicines as directed by your health care provider.  Try eating a couple of dry crackers or   toast in the morning before getting out of bed.  Avoid foods and smells that upset your stomach.  Avoid fatty and spicy foods.  Eat 5-6 small meals a day.  Do not drink when eating meals. Drink between meals.  For snacks, eat high-protein foods, such as cheese.  Eat or suck on things that have ginger in them. Ginger helps nausea.  Avoid food preparation. The smell of food can spoil your appetite.  Avoid iron pills and iron in your multivitamins until after 3-4 months of being pregnant. However, consult with your health care provider before stopping any prescribed iron pills. SEEK MEDICAL CARE IF:   Your abdominal pain increases.  You have a severe headache.  You have vision problems.  You are losing weight. SEEK IMMEDIATE MEDICAL CARE IF:   You are unable to keep fluids down.  You vomit blood.  You have constant nausea and vomiting.  You have excessive weakness.  You have extreme thirst.  You have dizziness or fainting.  You have a fever or persistent symptoms for more than 2-3 days.  You have a fever and your symptoms suddenly get worse. MAKE SURE YOU:   Understand these instructions.  Will watch your condition.  Will get help right away if you are not doing well or get worse.   This information is not intended to replace advice given to you by your  health care provider. Make sure you discuss any questions you have with your health care provider.   Document Released: 06/27/2005 Document Revised: 04/17/2013 Document Reviewed: 02/06/2013 Elsevier Interactive Patient Education 2016 [employer] Commonly Asked Questions During Pregnancy  Cats: A parasite can be excreted in cat feces.  To avoid exposure you need to have another person empty the little box.  If you must empty the litter box you will need to wear gloves.  Wash your hands after handling your cat.  This parasite can also be found in raw or undercooked meat so this should also be avoided.  Colds, Sore Throats, Flu: Please check your medication sheet to see what you can take for symptoms.  If your symptoms are unrelieved by these medications please call the office.  Dental Work: Most any dental work your dentist recommends is permitted.  X-rays should only be taken during the first trimester if absolutely necessary.  Your abdomen should be shielded with a lead apron during all x-rays.  Please notify your provider prior to receiving any x-rays.  Novocaine is fine; gas is not recommended.  If your dentist requires a note from us prior to dental work please call the office and we will provide one for you.  Exercise: Exercise is an important part of staying healthy during your pregnancy.  You may continue most exercises you were accustomed to prior to pregnancy.  Later in your pregnancy you will most likely notice you have difficulty with activities requiring balance like riding a bicycle.  It is important that you listen to your body and avoid activities that put you at a higher risk of falling.  Adequate rest and staying well hydrated are a must!  If you have questions about the safety of specific activities ask your provider.    Exposure to Children with illness: Try to avoid obvious exposure; report any symptoms to us when noted,  If you have chicken pos, red measles or mumps, you should  be immune to these diseases.   Please do not take any vaccines while pregnant unless you have checked with   your OB provider.  Fetal Movement: After 28 weeks we recommend you do "kick counts" twice daily.  Lie or sit down in a calm quiet environment and count your baby movements "kicks".  You should feel your baby at least 10 times per hour.  If you have not felt 10 kicks within the first hour get up, walk around and have something sweet to eat or drink then repeat for an additional hour.  If count remains less than 10 per hour notify your provider.  Fumigating: Follow your pest control agent's advice as to how long to stay out of your home.  Ventilate the area well before re-entering.  Hemorrhoids:   Most over-the-counter preparations can be used during pregnancy.  Check your medication to see what is safe to use.  It is important to use a stool softener or fiber in your diet and to drink lots of liquids.  If hemorrhoids seem to be getting worse please call the office.   Hot Tubs:  Hot tubs Jacuzzis and saunas are not recommended while pregnant.  These increase your internal body temperature and should be avoided.  Intercourse:  Sexual intercourse is safe during pregnancy as long as you are comfortable, unless otherwise advised by your provider.  Spotting may occur after intercourse; report any bright red bleeding that is heavier than spotting.  Labor:  If you know that you are in labor, please go to the hospital.  If you are unsure, please call the office and let us help you decide what to do.  Lifting, straining, etc:  If your job requires heavy lifting or straining please check with your provider for any limitations.  Generally, you should not lift items heavier than that you can lift simply with your hands and arms (no back muscles)  Painting:  Paint fumes do not harm your pregnancy, but may make you ill and should be avoided if possible.  Latex or water based paints have less odor than oils.   Use adequate ventilation while painting.  Permanents & Hair Color:  Chemicals in hair dyes are not recommended as they cause increase hair dryness which can increase hair loss during pregnancy.  " Highlighting" and permanents are allowed.  Dye may be absorbed differently and permanents may not hold as well during pregnancy.  Sunbathing:  Use a sunscreen, as skin burns easily during pregnancy.  Drink plenty of fluids; avoid over heating.  Tanning Beds:  Because their possible side effects are still unknown, tanning beds are not recommended.  Ultrasound Scans:  Routine ultrasounds are performed at approximately 20 weeks.  You will be able to see your baby's general anatomy an if you would like to know the gender this can usually be determined as well.  If it is questionable when you conceived you may also receive an ultrasound early in your pregnancy for dating purposes.  Otherwise ultrasound exams are not routinely performed unless there is a medical necessity.  Although you can request a scan we ask that you pay for it when conducted because insurance does not cover " patient request" scans.  Work: If your pregnancy proceeds without complications you may work until your due date, unless your physician or employer advises otherwise.  Round Ligament Pain/Pelvic Discomfort:  Sharp, shooting pains not associated with bleeding are fairly common, usually occurring in the second trimester of pregnancy.  They tend to be worse when standing up or when you remain standing for long periods of time.  These are the result   of pressure of certain pelvic ligaments called "round ligaments".  Rest, Tylenol and heat seem to be the most effective relief.  As the womb and fetus grow, they rise out of the pelvis and the discomfort improves.  Please notify the office if your pain seems different than that described.  It may represent a more serious condition.   

## 2016-05-09 NOTE — Progress Notes (Signed)
Patient ID: Brianna Washington, female   DOB: 03-14-1981, 35 y.o.   MRN: [id number] Brianna GlasgowChristina E Washington presents for NOB nurse interview visit. Pregnancy confirmation done on 04/21/2016 by Dr. Valentino Saxonherry.  G-2   P-1001. LMP: 03/10/2016, exact.  Pregnancy education material explained and given. No cats in the home. NOB labs ordered. TSH/HbgA1c due to Increased BMI, HIV labs and Drug screen were explained optional and she did not decline. BHCG ordered per pt request although no vaginal bleeding. Drug screen ordered. PNV encouraged. Genetic screening ordered. Pt would like to have NT.  Pt. To follow up with provider as scheduled for NOB physical.  All questions answered. Pt takes the Labetalol for tremors not for HTN. Pt states she has not had a diagnosis or a problem with HTN.

## 2016-05-10 LAB — RPR: RPR Ser Ql: NONREACTIVE

## 2016-05-10 LAB — CBC WITH DIFFERENTIAL/PLATELET
BASOS ABS: 0 10*3/uL (ref 0.0–0.2)
Basos: 0 %
EOS (ABSOLUTE): 0.1 10*3/uL (ref 0.0–0.4)
Eos: 1 %
HEMOGLOBIN: 13.3 g/dL (ref 11.1–15.9)
Hematocrit: 39.8 % (ref 34.0–46.6)
IMMATURE GRANS (ABS): 0 10*3/uL (ref 0.0–0.1)
IMMATURE GRANULOCYTES: 0 %
LYMPHS: 22 %
Lymphocytes Absolute: 1.9 10*3/uL (ref 0.7–3.1)
MCH: 30 pg (ref 26.6–33.0)
MCHC: 33.4 g/dL (ref 31.5–35.7)
MCV: 90 fL (ref 79–97)
MONOCYTES: 11 %
Monocytes Absolute: 1 10*3/uL — ABNORMAL HIGH (ref 0.1–0.9)
NEUTROS PCT: 66 %
Neutrophils Absolute: 5.7 10*3/uL (ref 1.4–7.0)
PLATELETS: 237 10*3/uL (ref 150–379)
RBC: 4.44 x10E6/uL (ref 3.77–5.28)
RDW: 14.1 % (ref 12.3–15.4)
WBC: 8.6 10*3/uL (ref 3.4–10.8)

## 2016-05-10 LAB — ANTIBODY SCREEN: Antibody Screen: NEGATIVE

## 2016-05-10 LAB — RUBELLA SCREEN: RUBELLA: 2.05 {index} (ref >0.99)

## 2016-05-10 LAB — HEMOGLOBIN A1C
ESTIMATED AVERAGE GLUCOSE: 94 mg/dL
HEMOGLOBIN A1C: 4.9 % (ref 4.8–5.6)

## 2016-05-10 LAB — ABO

## 2016-05-10 LAB — RH TYPE: Rh Factor: POSITIVE

## 2016-05-10 LAB — VARICELLA ZOSTER ANTIBODY, IGG

## 2016-05-10 LAB — HEPATITIS B SURFACE ANTIGEN: Hepatitis B Surface Ag: NEGATIVE

## 2016-05-10 LAB — TSH: TSH: 0.166 u[IU]/mL — AB (ref 0.450–4.500)

## 2016-05-10 LAB — HIV ANTIBODY (ROUTINE TESTING W REFLEX): HIV Screen 4th Generation wRfx: NONREACTIVE

## 2016-05-11 LAB — MONITOR DRUG PROFILE 14(MW)
Amphetamine Scrn, Ur: NEGATIVE ng/mL
BARBITURATE SCREEN URINE: NEGATIVE ng/mL
BENZODIAZEPINE SCREEN, URINE: NEGATIVE ng/mL
BUPRENORPHINE, URINE: NEGATIVE ng/mL
CANNABINOIDS UR QL SCN: NEGATIVE ng/mL
Cocaine (Metab) Scrn, Ur: NEGATIVE ng/mL
Creatinine(Crt), U: 161.9 mg/dL (ref 20.0–300.0)
FENTANYL, URINE: NEGATIVE pg/mL
Meperidine Screen, Urine: NEGATIVE ng/mL
Methadone Screen, Urine: NEGATIVE ng/mL
OPIATE SCREEN URINE: NEGATIVE ng/mL
OXYCODONE+OXYMORPHONE UR QL SCN: NEGATIVE ng/mL
PH UR, DRUG SCRN: 6.2 (ref 4.5–8.9)
PHENCYCLIDINE QUANTITATIVE URINE: NEGATIVE ng/mL
Propoxyphene Scrn, Ur: NEGATIVE ng/mL
SPECIFIC GRAVITY: 1.018
TRAMADOL SCREEN, URINE: NEGATIVE ng/mL

## 2016-05-11 LAB — URINALYSIS, ROUTINE W REFLEX MICROSCOPIC
Bilirubin, UA: NEGATIVE
Glucose, UA: NEGATIVE
Leukocytes, UA: NEGATIVE
Nitrite, UA: NEGATIVE
Protein, UA: NEGATIVE
RBC, UA: NEGATIVE
Specific Gravity, UA: 1.022 (ref 1.005–1.030)
Urobilinogen, Ur: 0.2 mg/dL (ref 0.2–1.0)
pH, UA: 6.5 (ref 5.0–7.5)

## 2016-05-11 LAB — NICOTINE SCREEN, URINE: COTININE UR QL SCN: POSITIVE ng/mL

## 2016-05-11 LAB — URINE CULTURE, OB REFLEX

## 2016-05-11 LAB — CULTURE, OB URINE

## 2016-05-11 LAB — GC/CHLAMYDIA PROBE AMP
CHLAMYDIA, DNA PROBE: NEGATIVE
Neisseria gonorrhoeae by PCR: NEGATIVE

## 2016-05-12 ENCOUNTER — Telehealth: Payer: Self-pay | Admitting: Obstetrics and Gynecology

## 2016-05-12 NOTE — Telephone Encounter (Signed)
Pt called for some lab results, everything is back but Hcg. Some things were abnormal. I did not tell her that. Only that I could not give results.please give pt a call

## 2016-05-12 NOTE — Telephone Encounter (Signed)
Called pt no answer, LM for pt informing her of abnormal TSH and Beta has not returned.

## 2016-05-13 ENCOUNTER — Ambulatory Visit (INDEPENDENT_AMBULATORY_CARE_PROVIDER_SITE_OTHER): Payer: 59

## 2016-05-13 ENCOUNTER — Telehealth: Payer: Self-pay

## 2016-05-13 ENCOUNTER — Encounter: Payer: Self-pay | Admitting: Obstetrics and Gynecology

## 2016-05-13 DIAGNOSIS — O0281 Inappropriate change in quantitative human chorionic gonadotropin (hCG) in early pregnancy: Secondary | ICD-10-CM

## 2016-05-13 DIAGNOSIS — O280 Abnormal hematological finding on antenatal screening of mother: Secondary | ICD-10-CM

## 2016-05-13 NOTE — Telephone Encounter (Signed)
Called pt LM for her informing her that based on her beta she is further along in the pregnancy than we initially thought, advised pt on the need for dating scan. Pt to call back and schedule.

## 2016-05-24 ENCOUNTER — Encounter: Payer: Self-pay | Admitting: Obstetrics and Gynecology

## 2016-05-24 ENCOUNTER — Other Ambulatory Visit: Payer: Self-pay

## 2016-05-24 ENCOUNTER — Other Ambulatory Visit: Payer: Self-pay | Admitting: Obstetrics and Gynecology

## 2016-05-24 ENCOUNTER — Other Ambulatory Visit: Payer: 59

## 2016-05-24 DIAGNOSIS — R946 Abnormal results of thyroid function studies: Secondary | ICD-10-CM | POA: Diagnosis not present

## 2016-05-24 DIAGNOSIS — R7989 Other specified abnormal findings of blood chemistry: Secondary | ICD-10-CM

## 2016-05-24 LAB — BETA HCG QUANT (REF LAB): HCG QUANT: 202957 m[IU]/mL

## 2016-05-24 LAB — SPECIMEN STATUS REPORT

## 2016-05-25 LAB — THYROID PANEL WITH TSH
FREE THYROXINE INDEX: 1.5 (ref 1.2–4.9)
T3 UPTAKE RATIO: 19 % — AB (ref 24–39)
T4 TOTAL: 7.7 ug/dL (ref 4.5–12.0)
TSH: 0.162 u[IU]/mL — ABNORMAL LOW (ref 0.450–4.500)

## 2016-05-26 ENCOUNTER — Telehealth: Payer: Self-pay | Admitting: Obstetrics and Gynecology

## 2016-05-26 NOTE — Telephone Encounter (Signed)
Patient called requesting her thyroid results

## 2016-05-27 NOTE — Telephone Encounter (Signed)
You may have some subclinical hypothyroidism going on, based on your labs due to the pregnancy. This is usually transient and resolves by the time you exit the 1st trimester. We will repeat the labs at your new OB visit. As long as your Free Thryoxine Index remains in normal range, you do not have hypothyrodism    Called pt no answer, sent mychart message with the information listed above.

## 2016-06-01 ENCOUNTER — Other Ambulatory Visit: Payer: Self-pay | Admitting: Obstetrics and Gynecology

## 2016-06-01 ENCOUNTER — Ambulatory Visit (INDEPENDENT_AMBULATORY_CARE_PROVIDER_SITE_OTHER): Payer: 59

## 2016-06-01 ENCOUNTER — Other Ambulatory Visit: Payer: 59

## 2016-06-01 DIAGNOSIS — Z369 Encounter for antenatal screening, unspecified: Secondary | ICD-10-CM

## 2016-06-01 DIAGNOSIS — Z3401 Encounter for supervision of normal first pregnancy, first trimester: Secondary | ICD-10-CM | POA: Diagnosis not present

## 2016-06-04 LAB — FIRST TRIMESTER SCREEN W/NT
CRL: 56.3 mm
DIA MOM: 1.64
DIA Value: 400.3 pg/mL
Gest Age-Collect: 12.1 weeks
HCG MOM: 0.98
MATERNAL AGE AT EDD: 34.4 a
NUCHAL TRANSLUCENCY: 1.5 mm
NUMBER OF FETUSES: 1
Nuchal Translucency MoM: 1.28
PAPP-A MoM: 1.33
PAPP-A Value: 936.6 ng/mL
TEST RESULTS: NEGATIVE
Weight: 173 [lb_av]
hCG Value: 86.1 IU/mL

## 2016-06-09 ENCOUNTER — Ambulatory Visit (INDEPENDENT_AMBULATORY_CARE_PROVIDER_SITE_OTHER): Payer: 59 | Admitting: Obstetrics and Gynecology

## 2016-06-09 VITALS — BP 96/58 | HR 82 | Wt 169.9 lb

## 2016-06-09 DIAGNOSIS — F17200 Nicotine dependence, unspecified, uncomplicated: Secondary | ICD-10-CM | POA: Insufficient documentation

## 2016-06-09 DIAGNOSIS — Z87891 Personal history of nicotine dependence: Secondary | ICD-10-CM | POA: Insufficient documentation

## 2016-06-09 DIAGNOSIS — R946 Abnormal results of thyroid function studies: Secondary | ICD-10-CM | POA: Diagnosis not present

## 2016-06-09 DIAGNOSIS — R7989 Other specified abnormal findings of blood chemistry: Secondary | ICD-10-CM

## 2016-06-09 DIAGNOSIS — E663 Overweight: Secondary | ICD-10-CM

## 2016-06-09 DIAGNOSIS — Z3482 Encounter for supervision of other normal pregnancy, second trimester: Secondary | ICD-10-CM

## 2016-06-09 DIAGNOSIS — Z720 Tobacco use: Secondary | ICD-10-CM

## 2016-06-09 LAB — POCT URINALYSIS DIPSTICK
Bilirubin, UA: NEGATIVE
Glucose, UA: NEGATIVE
Ketones, UA: NEGATIVE
LEUKOCYTES UA: NEGATIVE
NITRITE UA: NEGATIVE
PH UA: 8
PROTEIN UA: NEGATIVE
RBC UA: NEGATIVE
Spec Grav, UA: 1.01
UROBILINOGEN UA: NEGATIVE

## 2016-06-09 MED ORDER — CONCEPT DHA 53.5-38-1 MG PO CAPS: 53.5000 mg | ORAL_CAPSULE | Freq: Every day | ORAL | 11 refills | Status: DC

## 2016-06-09 NOTE — Progress Notes (Signed)
OBSTETRIC INITIAL PRENATAL VISIT  Subjective:    Brianna Washington is being seen today for her first obstetrical visit.  This is a planned pregnancy. She is a G2P1001 female at 61104w0d gestation, Estimated Date of Delivery: 12/15/16 with Patient's last menstrual period 03/10/2016 (consistent with 9 week sono). Her obstetrical history is significant for smoker. Relationship with FOB: spouse, living together. Patient does intend to breast feed. Pregnancy history fully reviewed.    Obstetric History   G2   P1   T1   P0   A0   L1    SAB0   TAB0   Ectopic0   Multiple0   Live Births1     # Outcome Date GA Lbr Len/2nd Weight Sex Delivery Anes PTL Lv  2 Current           1 Term 08/21/14 9760w0d  6 lb 6 oz (2.892 kg) F Vag-Spont EPI N LIV     Apgar1:  8                Apgar5: 9      Gynecologic History:  Last pap smear was .  Results were normal/abnormal.  Denies/admits h/o abnormal pap smaers in the past.  Denies/admits history of STIs.    Past Medical History:  Diagnosis Date  . Allergy   . Depression   . Tremors of nervous system    Essential Tremors (labetalol)    Family History  Problem Relation Age of Onset  . Hyperlipidemia Mother   . Hypertension Mother   . Hyperthyroidism Mother   . Hyperlipidemia Father   . Cancer Maternal Grandmother [id number]    metastatic breast ca   . Heart attack Maternal Grandfather   . Heart disease Maternal Grandfather     AMI  . Cancer Paternal Grandmother     NOn-Hodgkins Lymphoma  . Diabetes Paternal Grandmother   . Heart disease Paternal Grandfather      Past Surgical History:  Procedure Laterality Date  . TONSILLECTOMY  2010     Social History   Social History  . Marital status: Married    Spouse name: N/A  . Number of children: N/A  . Years of education: N/A   Occupational History  . Not on file.   Social History Main Topics  . Smoking status: Former Smoker    Packs/day: 0.25    Types: Cigarettes    Quit date: 09/23/2015  .  Smokeless tobacco: Never Used  . Alcohol use Yes     Comment: not currently  . Drug use: No  . Sexual activity: Yes   Other Topics Concern  . Not on file   Social History Narrative  . No narrative on file     Current Outpatient Prescriptions on File Prior to Visit  Medication Sig Dispense Refill  . cetirizine (ZYRTEC) 10 MG tablet Take 10 mg by mouth daily.    . citalopram (CELEXA) 20 MG tablet Take 1 tablet (20 mg total) by mouth daily. 90 tablet 1  . labetalol (NORMODYNE) 100 MG tablet TAKE 1 TABLET BY MOUTH TWICE DAILY 60 tablet 5  . loratadine (CLARITIN) 10 MG tablet Take 10 mg by mouth daily.    . Prenatal Vit-Fe Fumarate-FA (MULTIVITAMIN-PRENATAL) 27-0.8 MG TABS tablet Take 1 tablet by mouth daily at 12 noon.     No current facility-administered medications on file prior to visit.     No Known Allergies   Review of Systems General:Not Present- Fever, Weight Loss and Weight  Gain. Skin:Not Present- Rash. HEENT:Not Present- Blurred Vision, Headache and Bleeding Gums. Respiratory:Not Present- Difficulty Breathing. Breast:Not Present- Breast Mass. Cardiovascular:Not Present- Chest Pain, Elevated Blood Pressure, Fainting / Blacking Out and Shortness of Breath. Gastrointestinal:Not Present- Abdominal Pain, Constipation, Nausea and Vomiting. Female Genitourinary:Not Present- Frequency, Painful Urination, Pelvic Pain, Vaginal Bleeding, Vaginal Discharge, Contractions, regular, Fetal Movements Decreased, Urinary Complaints and Vaginal Fluid. Musculoskeletal:Not Present- Back Pain and Leg Cramps. Neurological:Not Present- Dizziness. Psychiatric:Not Present- Depression.     Objective:   Blood pressure (!) 96/58, pulse 82, weight 169 lb 14.4 oz (77.1 kg), last menstrual period 03/10/2016, not currently breastfeeding.  Body mass index is 29.16 kg/m.   General Appearance:    Alert, cooperative, no distress, appears stated age  Head:    Normocephalic, without obvious  abnormality, atraumatic  Eyes:    PERRL, conjunctiva/corneas clear, EOM's intact, both eyes  Ears:    Normal external ear canals, both ears  Nose:   Nares normal, septum midline, mucosa normal, no drainage or sinus tenderness  Throat:   Lips, mucosa, and tongue normal; teeth and gums normal  Neck:   Supple, symmetrical, trachea midline, no adenopathy; thyroid: no enlargement/tenderness/nodules; no carotid bruit or JVD  Back:     Symmetric, no curvature, ROM normal, no CVA tenderness  Lungs:     Clear to auscultation bilaterally, respirations unlabored  Chest Wall:    No tenderness or deformity   Heart:    Regular rate and rhythm, S1 and S2 normal, no murmur, rub or gallop  Breast Exam:    No tenderness, masses, or nipple abnormality  Abdomen:     Soft, non-tender, bowel sounds active all four quadrants, no masses, no organomegaly.  FH 13.  FHT 154 bpm.  Genitalia:    Pelvic:external genitalia normal, vagina without lesions, discharge, or tenderness, rectovaginal septum  normal. Cervix normal in appearance, no cervical motion tenderness, no adnexal masses or tenderness.  Pregnancy positive findings: uterine enlargement: 13 wk size, nontender.   Rectal:    Normal external sphincter.  No hemorrhoids appreciated. Internal exam not done.   Extremities:   Extremities normal, atraumatic, no cyanosis or edema  Pulses:   2+ and symmetric all extremities  Skin:   Skin color, texture, turgor normal, no rashes or lesions  Lymph nodes:   Cervical, supraclavicular, and axillary nodes normal  Neurologic:   CNII-XII intact, normal strength, sensation and reflexes throughout     Assessment:   Pregnancy at 13 and 0/7 weeks  Tobacco abuse Elevated TSH Overweight  Plan:   Initial labs reviewed. Elevated TSH earlier in pregnancy, for repeat thyroid studies today.  No prior history of thyroid disease.  Prenatal vitamins encouraged. Problem list reviewed and updated. New OB counseling:  The patient has  been given an overview regarding routine prenatal care.  Recommendations regarding diet, weight gain, and exercise in pregnancy were given. Prenatal testing, optional genetic testing, and ultrasound use in pregnancy were reviewed.  AFP3 discussed: results reviewed. Benefits of Breast Feeding were discussed. The patient is encouraged to consider nursing her baby post partum. Discussed tobacco cessation, patient desires to quit.  Currently has cut down to ~4 cig/day.  Discussed options such as nicotine patch, gum, cut down method, medications, cessation hotlines. Patient will attempt cut down method.  To f/u at next visit.  Follow up in 4 weeks.  50% of 30 min visit spent on counseling and coordination of care.     Hildred LaserAnika Leyla Soliz, MD [facility]

## 2016-06-10 LAB — THYROID PANEL WITH TSH
FREE THYROXINE INDEX: 1.3 (ref 1.2–4.9)
T3 UPTAKE RATIO: 18 % — AB (ref 24–39)
T4, Total: 7.3 ug/dL (ref 4.5–12.0)
TSH: 0.677 u[IU]/mL (ref 0.450–4.500)

## 2016-06-12 ENCOUNTER — Encounter: Payer: Self-pay | Admitting: Obstetrics and Gynecology

## 2016-06-12 DIAGNOSIS — Z0000 Encounter for general adult medical examination without abnormal findings: Secondary | ICD-10-CM | POA: Insufficient documentation

## 2016-06-12 DIAGNOSIS — E66811 Obesity, class 1: Secondary | ICD-10-CM | POA: Insufficient documentation

## 2016-06-12 DIAGNOSIS — E663 Overweight: Secondary | ICD-10-CM | POA: Insufficient documentation

## 2016-07-07 ENCOUNTER — Ambulatory Visit (INDEPENDENT_AMBULATORY_CARE_PROVIDER_SITE_OTHER): Payer: 59 | Admitting: Obstetrics and Gynecology

## 2016-07-07 VITALS — BP 91/52 | HR 99 | Wt 169.3 lb

## 2016-07-07 DIAGNOSIS — O2612 Low weight gain in pregnancy, second trimester: Secondary | ICD-10-CM

## 2016-07-07 DIAGNOSIS — Z3482 Encounter for supervision of other normal pregnancy, second trimester: Secondary | ICD-10-CM

## 2016-07-07 LAB — POCT URINALYSIS DIPSTICK
BILIRUBIN UA: NEGATIVE
Blood, UA: NEGATIVE
Glucose, UA: NEGATIVE
KETONES UA: NEGATIVE
Nitrite, UA: NEGATIVE
PH UA: 7.5
Protein, UA: NEGATIVE
Spec Grav, UA: 1.015
Urobilinogen, UA: NEGATIVE

## 2016-07-07 NOTE — Progress Notes (Signed)
ROB: Doing well. Denies complaints. Concerned about weight gain (has gained no weight since onset of pregnancy, and lost weight in 1st trimester due to nausea/vomiting).   Discussed use of nutritional supplement (Boost/Ensure 2 x daily with meals), and increasing protein intake.  For anatomy scan next visit. F/U in 4 weeks.

## 2016-07-11 NOTE — L&D Delivery Note (Signed)
@  LOGO@     Delivery Note   Brianna Washington is a 36 y.o. G2P1001 at 7365w3d Estimated Date of Delivery: 12/15/16  PRE-OPERATIVE DIAGNOSIS:  1) 6165w3d pregnancy.   POST-OPERATIVE DIAGNOSIS:  1) 4065w3d pregnancy s/p Vaginal, Spontaneous Delivery   Delivery Type: Vaginal, Spontaneous Delivery    Delivery Clinician: Linzie CollinEVANS, Vickey Boak JAMES   Delivery Anesthesia: Epidural   Labor Complications:     Additional complications: short cord  ESTIMATED BLOOD LOSS: 125  ml    FINDINGS:   1) female infant, Apgar scores of     at 1 minute and     at 5 minutes and a birthweight of    ounces.    2) Nuchal cord: no  SPECIMENS:   PLACENTA:   Appearance:      Removal: Spontaneous      Disposition:     DISPOSITION:  Infant to left in stable condition in the delivery room, with L&D personnel and mother,  NARRATIVE SUMMARY: Labor course:  [name] is a G2P1001 at 8065w3d who presented for labor management.  She progressed well in labor without pitocin.  She received the appropriate anesthesia and proceeded to complete dilation. She evidenced good maternal expulsive effort during the second stage. She went on to deliver a viable infant. The placenta delivered without problems and was noted to be complete. A perineal and vaginal examination was performed. Episiotomy/Lacerations: None   Elonda Huskyavid J. Kylor Valverde, M.D. 12/18/2016 1:34 PM

## 2016-07-21 ENCOUNTER — Ambulatory Visit (INDEPENDENT_AMBULATORY_CARE_PROVIDER_SITE_OTHER): Payer: 59

## 2016-07-21 DIAGNOSIS — Z3482 Encounter for supervision of other normal pregnancy, second trimester: Secondary | ICD-10-CM

## 2016-08-01 ENCOUNTER — Encounter: Payer: 59 | Admitting: Certified Nurse Midwife

## 2016-08-01 ENCOUNTER — Encounter: Payer: Self-pay | Admitting: Obstetrics and Gynecology

## 2016-08-01 ENCOUNTER — Ambulatory Visit (INDEPENDENT_AMBULATORY_CARE_PROVIDER_SITE_OTHER): Payer: 59 | Admitting: Obstetrics and Gynecology

## 2016-08-01 VITALS — BP 101/64 | HR 83 | Wt 171.1 lb

## 2016-08-01 DIAGNOSIS — Z3492 Encounter for supervision of normal pregnancy, unspecified, second trimester: Secondary | ICD-10-CM

## 2016-08-01 LAB — POCT URINALYSIS DIPSTICK
BILIRUBIN UA: NEGATIVE
Blood, UA: NEGATIVE
Glucose, UA: NEGATIVE
Ketones, UA: NEGATIVE
LEUKOCYTES UA: NEGATIVE
NITRITE UA: NEGATIVE
PH UA: 5
PROTEIN UA: NEGATIVE
Spec Grav, UA: >=1.03
Urobilinogen, UA: NEGATIVE

## 2016-08-01 NOTE — Progress Notes (Signed)
Pt is here for a routine prenatal visit. 

## 2016-08-01 NOTE — Progress Notes (Signed)
No complaints. Reports active fetal ointment.

## 2016-08-17 ENCOUNTER — Encounter: Payer: Self-pay | Admitting: Obstetrics and Gynecology

## 2016-08-17 ENCOUNTER — Ambulatory Visit (INDEPENDENT_AMBULATORY_CARE_PROVIDER_SITE_OTHER): Payer: 59 | Admitting: Obstetrics and Gynecology

## 2016-08-17 VITALS — BP 131/67 | HR 100 | Temp 99.2°F | Wt 174.9 lb

## 2016-08-17 DIAGNOSIS — Z3482 Encounter for supervision of other normal pregnancy, second trimester: Secondary | ICD-10-CM | POA: Diagnosis not present

## 2016-08-17 DIAGNOSIS — R6889 Other general symptoms and signs: Secondary | ICD-10-CM | POA: Diagnosis not present

## 2016-08-17 DIAGNOSIS — J111 Influenza due to unidentified influenza virus with other respiratory manifestations: Secondary | ICD-10-CM

## 2016-08-17 LAB — POCT INFLUENZA A/B
INFLUENZA A, POC: POSITIVE — AB
Influenza B, POC: NEGATIVE

## 2016-08-17 MED ORDER — OSELTAMIVIR PHOSPHATE 75 MG PO CAPS: 75.0000 mg | ORAL_CAPSULE | Freq: Two times a day (BID) | ORAL | 0 refills | Status: DC

## 2016-08-17 NOTE — Progress Notes (Signed)
Problem OB Visit: Patient presents with complaints of cough x 2 days and body aches x 1 day.  Thinks she may have the flu.  Positive flu swab in office today. Initiated Tamiflu, advised on Theraflu, tylenol, robitussin OTC for symptomatic relief. Discussed hand hygiene. Work notice given to be out for the next several days. RTC in 2 weeks for routine OB visit.

## 2016-08-30 ENCOUNTER — Ambulatory Visit (INDEPENDENT_AMBULATORY_CARE_PROVIDER_SITE_OTHER): Payer: 59 | Admitting: Obstetrics and Gynecology

## 2016-08-30 VITALS — BP 95/55 | HR 86 | Wt 178.2 lb

## 2016-08-30 DIAGNOSIS — Z720 Tobacco use: Secondary | ICD-10-CM

## 2016-08-30 DIAGNOSIS — Z3482 Encounter for supervision of other normal pregnancy, second trimester: Secondary | ICD-10-CM

## 2016-08-30 NOTE — Progress Notes (Signed)
ROB: Doing well. No complaints. RTC in 4 weeks. For 28 week labs at that time. Has cut down to 2 cig/day.  Continue to encourage smoking cessation.

## 2016-09-15 ENCOUNTER — Other Ambulatory Visit: Payer: Self-pay | Admitting: Internal Medicine

## 2016-09-20 ENCOUNTER — Observation Stay
Admission: EM | Admit: 2016-09-20 | Discharge: 2016-09-20 | Disposition: A | Discharge status: Home / Self Care | Payer: 59 | Attending: Obstetrics and Gynecology | Admitting: Obstetrics and Gynecology

## 2016-09-20 DIAGNOSIS — Z3482 Encounter for supervision of other normal pregnancy, second trimester: Secondary | ICD-10-CM | POA: Diagnosis not present

## 2016-09-20 DIAGNOSIS — Z3A27 27 weeks gestation of pregnancy: Secondary | ICD-10-CM | POA: Diagnosis not present

## 2016-09-20 DIAGNOSIS — W000XXA Fall on same level due to ice and snow, initial encounter: Secondary | ICD-10-CM | POA: Diagnosis not present

## 2016-09-20 DIAGNOSIS — Z9181 History of falling: Secondary | ICD-10-CM

## 2016-09-20 DIAGNOSIS — Z3490 Encounter for supervision of normal pregnancy, unspecified, unspecified trimester: Secondary | ICD-10-CM

## 2016-09-20 NOTE — Discharge Instructions (Signed)
Discharge instructions given to patient. Labor precautions and fetal movement reviewed. Rest and drink plenty of fluids today.

## 2016-09-20 NOTE — Final Progress Note (Signed)
L&D OB Triage Note  SUBJECTIVE Brianna Washington is a 36 y.o. G2P1001 female at 7061w5d, EDD Estimated Date of Delivery: 12/15/16 who presented to triage with complaints of Fall on ice this morning. Pt was carrying her 36 year old, slipped on ice , and fell onto buttocks. No direct trauma to abdomen. Fetus is active. No vaginal bleeding or loss of fluid. No contractions.  Past Medical History:  Diagnosis Date  . Allergy   . Depression   . Tremors of nervous system    Essential Tremors (labetalol)   Past Surgical History:  Procedure Laterality Date  . TONSILLECTOMY  2010      OBJECTIVE Nursing Evaluation: BP (!) 103/58   Pulse 90   Temp 97.9 F (36.6 C) (Oral)   LMP 03/10/2016  no significant findings/findings significant for PML  MD Eval: Abdomen non tender; uterus non tender;.No abdominal ecchymosis. No flank tenderness Buttocks without ecchymosis; mild tenderness on sacrum  NST was performed and has been reviewed by me.  NST INTERPRETATION: Indications: Normal FHR; no uterine contractions.  Mode: External Baseline Rate (A): 145 bpm Variability: Moderate Accelerations: 10 x 10 Decelerations: None     Contraction Frequency (min): none  ASSESSMENT Impression:  1. Pregnancy:  G2P1001 at 3061w5d , EDD Estimated Date of Delivery: 12/15/16 2.  FHR monitoring reassuring.  PLAN 1. Reassurance given 2. Discharge home with bleeding/labor precautions. No work today. 3. Continue routine prenatal care.   Herold HarmsMartin A Trayonna Bachmeier, MD

## 2016-09-20 NOTE — OB Triage Note (Signed)
Pt arrived to obs rm 3 with recent fall. Pt was walking outside this morning and fell on ice. Pt stated did not fall on stomach but landed on bottom. No contractions, LOF, or bleeding. Pt placed on monitors and oriented to room. Will cont. To monitor.

## 2016-09-27 ENCOUNTER — Other Ambulatory Visit: Payer: 59

## 2016-09-27 ENCOUNTER — Ambulatory Visit (INDEPENDENT_AMBULATORY_CARE_PROVIDER_SITE_OTHER): Payer: 59 | Admitting: Obstetrics and Gynecology

## 2016-09-27 VITALS — BP 103/58 | HR 82 | Wt 185.7 lb

## 2016-09-27 DIAGNOSIS — Z3483 Encounter for supervision of other normal pregnancy, third trimester: Secondary | ICD-10-CM

## 2016-09-27 DIAGNOSIS — Z131 Encounter for screening for diabetes mellitus: Secondary | ICD-10-CM | POA: Diagnosis not present

## 2016-09-27 DIAGNOSIS — O99619 Diseases of the digestive system complicating pregnancy, unspecified trimester: Secondary | ICD-10-CM

## 2016-09-27 DIAGNOSIS — Z720 Tobacco use: Secondary | ICD-10-CM

## 2016-09-27 DIAGNOSIS — Z23 Encounter for immunization: Secondary | ICD-10-CM

## 2016-09-27 DIAGNOSIS — Z130 Encounter for screening for diseases of the blood and blood-forming organs and certain disorders involving the immune mechanism: Secondary | ICD-10-CM

## 2016-09-27 DIAGNOSIS — K219 Gastro-esophageal reflux disease without esophagitis: Secondary | ICD-10-CM | POA: Insufficient documentation

## 2016-09-27 DIAGNOSIS — F17200 Nicotine dependence, unspecified, uncomplicated: Secondary | ICD-10-CM

## 2016-09-27 LAB — POCT URINALYSIS DIPSTICK
BILIRUBIN UA: NEGATIVE
GLUCOSE UA: NEGATIVE
Ketones, UA: NEGATIVE
NITRITE UA: NEGATIVE
Protein, UA: NEGATIVE
RBC UA: NEGATIVE
Spec Grav, UA: 1.005 (ref 1.030–1.035)
Urobilinogen, UA: NEGATIVE (ref ?–2.0)
pH, UA: 8.5 — AB (ref 5.0–8.0)

## 2016-09-27 MED ORDER — PANTOPRAZOLE SODIUM 20 MG PO TBEC: 20.0000 mg | DELAYED_RELEASE_TABLET | Freq: Every day | ORAL | 5 refills | Status: DC

## 2016-09-27 MED ORDER — PANTOPRAZOLE SODIUM 20 MG PO TBEC: 20.0000 mg | DELAYED_RELEASE_TABLET | Freq: Two times a day (BID) | ORAL | 5 refills | Status: DC

## 2016-09-27 MED ORDER — TETANUS-DIPHTH-ACELL PERTUSSIS 5-2.5-18.5 LF-MCG/0.5 IM SUSP
0.5000 mL | Freq: Once | INTRAMUSCULAR | Status: AC
  Administered 2016-09-27: 0.5 mL via INTRAMUSCULAR

## 2016-09-27 NOTE — Progress Notes (Signed)
ROB: Patient doing well.  S/p fall last week with snow and ice, was evaluated in L&D triage, with no issues.  Continued to encourage smoking cessation, still smoking ~ 2 cig/day. Reports reflux only modestly controlled with Zantac and Tums. Will prescribe Protonix. For 28 week labs today.  Desires to breastfeed, desires unsure method for contraception. For Tdap today, signed blood consent, discussed cord blood banking. Discussed fetal kick counts.

## 2016-09-28 LAB — HEMOGLOBIN AND HEMATOCRIT, BLOOD
HEMATOCRIT: 33.8 % — AB (ref 34.0–46.6)
HEMOGLOBIN: 11.4 g/dL (ref 11.1–15.9)

## 2016-09-28 LAB — GLUCOSE, 1 HOUR GESTATIONAL: GESTATIONAL DIABETES SCREEN: 91 mg/dL (ref 65–139)

## 2016-10-11 ENCOUNTER — Encounter: Payer: Self-pay | Admitting: Obstetrics and Gynecology

## 2016-10-11 ENCOUNTER — Ambulatory Visit (INDEPENDENT_AMBULATORY_CARE_PROVIDER_SITE_OTHER): Payer: 59 | Admitting: Obstetrics and Gynecology

## 2016-10-11 VITALS — BP 105/58 | HR 93 | Wt 189.4 lb

## 2016-10-11 DIAGNOSIS — Z3493 Encounter for supervision of normal pregnancy, unspecified, third trimester: Secondary | ICD-10-CM

## 2016-10-11 LAB — POCT URINALYSIS DIPSTICK
Bilirubin, UA: NEGATIVE
Blood, UA: NEGATIVE
Glucose, UA: NEGATIVE
KETONES UA: NEGATIVE
Leukocytes, UA: NEGATIVE
Nitrite, UA: NEGATIVE
PROTEIN UA: NEGATIVE
SPEC GRAV UA: 1.02 (ref 1.030–1.035)
Urobilinogen, UA: NEGATIVE (ref ?–2.0)
pH, UA: 7.5 (ref 5.0–8.0)

## 2016-10-11 NOTE — Progress Notes (Signed)
ROB:  No problems.  Baby Oblique.

## 2016-10-22 ENCOUNTER — Observation Stay
Admission: EM | Admit: 2016-10-22 | Discharge: 2016-10-23 | Disposition: A | Discharge status: Home / Self Care | Payer: 59 | Attending: Obstetrics and Gynecology | Admitting: Obstetrics and Gynecology

## 2016-10-22 DIAGNOSIS — O26893 Other specified pregnancy related conditions, third trimester: Secondary | ICD-10-CM | POA: Diagnosis not present

## 2016-10-22 DIAGNOSIS — R109 Unspecified abdominal pain: Secondary | ICD-10-CM | POA: Insufficient documentation

## 2016-10-22 DIAGNOSIS — R112 Nausea with vomiting, unspecified: Secondary | ICD-10-CM | POA: Diagnosis not present

## 2016-10-22 DIAGNOSIS — Z3A32 32 weeks gestation of pregnancy: Secondary | ICD-10-CM | POA: Diagnosis not present

## 2016-10-22 MED ORDER — CALCIUM CARBONATE ANTACID 500 MG PO CHEW
CHEWABLE_TABLET | ORAL | Status: AC
  Filled 2016-10-22: qty 2

## 2016-10-22 MED ORDER — CALCIUM CARBONATE ANTACID 500 MG PO CHEW
2.0000 | CHEWABLE_TABLET | Freq: Every day | ORAL | Status: DC
  Administered 2016-10-22: 400 mg via ORAL

## 2016-10-22 MED ORDER — ONDANSETRON HCL 4 MG/2ML IJ SOLN
4.0000 mg | Freq: Once | INTRAMUSCULAR | Status: AC
  Administered 2016-10-22: 4 mg via INTRAVENOUS
  Filled 2016-10-22: qty 2

## 2016-10-22 MED ORDER — LACTATED RINGERS IV BOLUS (SEPSIS)
500.0000 mL | Freq: Once | INTRAVENOUS | Status: AC
  Administered 2016-10-22: 500 mL via INTRAVENOUS

## 2016-10-22 MED ORDER — DEXTROSE IN LACTATED RINGERS 5 % IV SOLN
INTRAVENOUS | Status: DC
  Administered 2016-10-22: 23:00:00 via INTRAVENOUS

## 2016-10-22 NOTE — OB Triage Note (Signed)
Pt arrived to unit with c/o nausea and vomiting starting at midnight on 10/22/16. Pt states that she started feeling some contractions along with GI cramping this afternoon. Pt states that N/V not relieved by any home remedies and that she cannot hold down PO fluids. +FM, denies any bleeding or leaking of fluid.

## 2016-10-23 DIAGNOSIS — R109 Unspecified abdominal pain: Secondary | ICD-10-CM

## 2016-10-23 DIAGNOSIS — R112 Nausea with vomiting, unspecified: Secondary | ICD-10-CM

## 2016-10-23 DIAGNOSIS — O26893 Other specified pregnancy related conditions, third trimester: Secondary | ICD-10-CM

## 2016-10-23 DIAGNOSIS — Z3A32 32 weeks gestation of pregnancy: Secondary | ICD-10-CM | POA: Diagnosis not present

## 2016-10-23 MED ORDER — ONDANSETRON HCL 4 MG PO TABS
4.0000 mg | ORAL_TABLET | Freq: Once | ORAL | Status: DC
  Filled 2016-10-23 (×2): qty 1

## 2016-10-23 NOTE — Discharge Summary (Signed)
   L&D OB Triage Note  SUBJECTIVE Brianna Washington is a 36 y.o. G2P1001 female at [redacted]w[redacted]d, EDD Estimated Date of Delivery: 12/15/16 who presented to triage with complaints of persistent N/V.   Obstetric History   G2   P1   T1   P0   A0   L1    SAB0   TAB0   Ectopic0   Multiple0   Live Births1     # Outcome Date GA Lbr Len/2nd Weight Sex Delivery Anes PTL Lv  2 Current           1 Term 08/21/14 [redacted]w[redacted]d  6 lb 6 oz (2.892 kg) F Vag-Spont EPI N LIV     Apgar1:  8                Apgar5: 9      No prescriptions prior to admission.     OBJECTIVE  Nursing Evaluation:   BP (!) 125/53 (BP Location: Left Arm)   Pulse (!) 111   Temp 98.1 F (36.7 C) (Oral)   Resp 20   Ht  (1.626 m)   Wt 187 lb (84.8 kg)   LMP 03/10/2016   BMI 32.10 kg/m    Findings:  Irregular mild contractions.  Not in labor.  NST was performed and has been reviewed by me.  NST INTERPRETATION: Category I  Mode: External Baseline Rate (A): 145 bpm Variability: Moderate Accelerations: 15 x 15 Decelerations: None     Contraction Frequency (min): rare  ASSESSMENT Impression:  1. Pregnancy:  G2P1001 at [redacted]w[redacted]d , EDD Estimated Date of Delivery: 12/15/16 2.  Reactive 3.  GI upset  PLAN 1. IV hydration done.  Zofran given.  Pt improved and felt much better after. 2. Discharge home with precautions to return to L&D or call the office if:  contractions more than  12 per  1 hour, bleeding from vaginal area or continued vomiting  3. Continue routine prenatal care.

## 2016-10-23 NOTE — Discharge Instructions (Signed)

## 2016-10-25 ENCOUNTER — Ambulatory Visit (INDEPENDENT_AMBULATORY_CARE_PROVIDER_SITE_OTHER): Payer: 59 | Admitting: Obstetrics and Gynecology

## 2016-10-25 ENCOUNTER — Encounter: Payer: Self-pay | Admitting: Obstetrics and Gynecology

## 2016-10-25 VITALS — BP 103/63 | HR 88 | Wt 192.3 lb

## 2016-10-25 DIAGNOSIS — Z3493 Encounter for supervision of normal pregnancy, unspecified, third trimester: Secondary | ICD-10-CM

## 2016-10-25 LAB — POCT URINALYSIS DIPSTICK
BILIRUBIN UA: NEGATIVE
GLUCOSE UA: NEGATIVE
Ketones, UA: NEGATIVE
Leukocytes, UA: NEGATIVE
Nitrite, UA: NEGATIVE
Protein, UA: NEGATIVE
RBC UA: NEGATIVE
Spec Grav, UA: <=1.005 — AB (ref 1.010–1.025)
UROBILINOGEN UA: NEGATIVE U/dL — AB
pH, UA: 7.5 (ref 5.0–8.0)

## 2016-10-25 NOTE — Progress Notes (Signed)
ROB: VTX now.  N/V resolved. Still tired.  Treating hemorrhoid with prep H and stool soft.

## 2016-10-26 ENCOUNTER — Encounter: Payer: 59 | Admitting: Obstetrics and Gynecology

## 2016-11-02 DIAGNOSIS — Z0289 Encounter for other administrative examinations: Secondary | ICD-10-CM

## 2016-11-09 ENCOUNTER — Ambulatory Visit (INDEPENDENT_AMBULATORY_CARE_PROVIDER_SITE_OTHER): Payer: 59 | Admitting: Obstetrics and Gynecology

## 2016-11-09 VITALS — BP 106/64 | HR 93 | Wt 193.1 lb

## 2016-11-09 DIAGNOSIS — J301 Allergic rhinitis due to pollen: Secondary | ICD-10-CM

## 2016-11-09 DIAGNOSIS — Z3483 Encounter for supervision of other normal pregnancy, third trimester: Secondary | ICD-10-CM

## 2016-11-09 LAB — POCT URINALYSIS DIPSTICK
Bilirubin, UA: NEGATIVE
Glucose, UA: NEGATIVE
KETONES UA: NEGATIVE
Nitrite, UA: NEGATIVE
Protein, UA: NEGATIVE
RBC UA: NEGATIVE
SPEC GRAV UA: 1.01 (ref 1.010–1.025)
Urobilinogen, UA: 0.2 E.U./dL
pH, UA: 8 (ref 5.0–8.0)

## 2016-11-09 NOTE — Progress Notes (Signed)
ROB: Doing well.  Noting allergy symptoms.  Is taking Claritin, Zyrtec, and Sudafed. Desires to donate cord blood.  RTC in 2 weeks for 36 week labs.

## 2016-11-23 ENCOUNTER — Ambulatory Visit (INDEPENDENT_AMBULATORY_CARE_PROVIDER_SITE_OTHER): Payer: 59 | Admitting: Obstetrics and Gynecology

## 2016-11-23 VITALS — BP 92/54 | HR 96 | Wt 196.9 lb

## 2016-11-23 DIAGNOSIS — Z113 Encounter for screening for infections with a predominantly sexual mode of transmission: Secondary | ICD-10-CM

## 2016-11-23 DIAGNOSIS — Z3685 Encounter for antenatal screening for Streptococcus B: Secondary | ICD-10-CM

## 2016-11-23 DIAGNOSIS — Z3483 Encounter for supervision of other normal pregnancy, third trimester: Secondary | ICD-10-CM | POA: Diagnosis not present

## 2016-11-23 LAB — POCT URINALYSIS DIPSTICK
BILIRUBIN UA: NEGATIVE
Glucose, UA: NEGATIVE
Ketones, UA: NEGATIVE
NITRITE UA: NEGATIVE
Protein, UA: NEGATIVE
RBC UA: NEGATIVE
Spec Grav, UA: <=1.005 — AB (ref 1.010–1.025)
Urobilinogen, UA: 0.2 E.U./dL
pH, UA: 8.5 — AB (ref 5.0–8.0)

## 2016-11-23 LAB — OB RESULTS CONSOLE GBS: GBS: NEGATIVE

## 2016-11-23 NOTE — Progress Notes (Signed)
ROB: Doing well, note some cramping, but otherwise ok. 36 week labs done. Discussed labor precautions. RTC in 1 week.

## 2016-11-25 LAB — GC/CHLAMYDIA PROBE AMP
Chlamydia trachomatis, NAA: NEGATIVE
NEISSERIA GONORRHOEAE BY PCR: NEGATIVE

## 2016-11-27 LAB — CULTURE, BETA STREP (GROUP B ONLY): STREP GP B CULTURE: NEGATIVE

## 2016-11-29 ENCOUNTER — Ambulatory Visit (INDEPENDENT_AMBULATORY_CARE_PROVIDER_SITE_OTHER): Payer: 59 | Admitting: Obstetrics and Gynecology

## 2016-11-29 ENCOUNTER — Encounter: Payer: Self-pay | Admitting: Obstetrics and Gynecology

## 2016-11-29 VITALS — BP 114/67 | HR 85 | Wt 197.1 lb

## 2016-11-29 DIAGNOSIS — Z3483 Encounter for supervision of other normal pregnancy, third trimester: Secondary | ICD-10-CM

## 2016-11-29 DIAGNOSIS — Z3493 Encounter for supervision of normal pregnancy, unspecified, third trimester: Secondary | ICD-10-CM

## 2016-11-29 LAB — POCT URINALYSIS DIPSTICK
Bilirubin, UA: NEGATIVE
Glucose, UA: NEGATIVE
Leukocytes, UA: NEGATIVE
NITRITE UA: NEGATIVE
PH UA: 8 (ref 5.0–8.0)
Protein, UA: NEGATIVE
RBC UA: NEGATIVE
SPEC GRAV UA: 1.01 (ref 1.010–1.025)
UROBILINOGEN UA: 0.2 U/dL

## 2016-11-29 NOTE — Progress Notes (Signed)
ROB:  No complaints.  Occ contractions.  S&S discussed.  Discussed ketones - pt to decrease soda and increase H2O intake

## 2016-12-09 ENCOUNTER — Ambulatory Visit (INDEPENDENT_AMBULATORY_CARE_PROVIDER_SITE_OTHER): Payer: 59 | Admitting: Obstetrics and Gynecology

## 2016-12-09 VITALS — BP 109/70 | HR 81 | Wt 203.3 lb

## 2016-12-09 DIAGNOSIS — Z3483 Encounter for supervision of other normal pregnancy, third trimester: Secondary | ICD-10-CM

## 2016-12-09 LAB — POCT URINALYSIS DIPSTICK
BILIRUBIN UA: NEGATIVE
GLUCOSE UA: NEGATIVE
KETONES UA: NEGATIVE
LEUKOCYTES UA: NEGATIVE
NITRITE UA: NEGATIVE
PH UA: 6.5 (ref 5.0–8.0)
Protein, UA: NEGATIVE
Spec Grav, UA: 1.01 (ref 1.010–1.025)
Urobilinogen, UA: 0.2 E.U./dL

## 2016-12-10 NOTE — Progress Notes (Signed)
ROB: Doing well, notes increased pressure.  Reviewed labor precautions. RTC in 1 week.

## 2016-12-16 ENCOUNTER — Other Ambulatory Visit (INDEPENDENT_AMBULATORY_CARE_PROVIDER_SITE_OTHER): Payer: 59

## 2016-12-16 ENCOUNTER — Ambulatory Visit (INDEPENDENT_AMBULATORY_CARE_PROVIDER_SITE_OTHER): Payer: 59 | Admitting: Obstetrics and Gynecology

## 2016-12-16 ENCOUNTER — Encounter: Payer: Self-pay | Admitting: Obstetrics and Gynecology

## 2016-12-16 VITALS — BP 99/63 | HR 94 | Wt 197.5 lb

## 2016-12-16 DIAGNOSIS — O480 Post-term pregnancy: Secondary | ICD-10-CM

## 2016-12-16 DIAGNOSIS — Z3493 Encounter for supervision of normal pregnancy, unspecified, third trimester: Secondary | ICD-10-CM

## 2016-12-16 LAB — POCT URINALYSIS DIPSTICK
BILIRUBIN UA: NEGATIVE
Blood, UA: NEGATIVE
Glucose, UA: NEGATIVE
Ketones, UA: NEGATIVE
LEUKOCYTES UA: NEGATIVE
NITRITE UA: NEGATIVE
PH UA: 7.5 (ref 5.0–8.0)
PROTEIN UA: NEGATIVE
Spec Grav, UA: 1.01 (ref 1.010–1.025)
Urobilinogen, UA: 0.2 E.U./dL

## 2016-12-16 NOTE — Progress Notes (Signed)
ROB:  Doing well - rare contractions.  Good FM.  Scheduled BPP on Monday.  Induction Wed night.  S&S

## 2016-12-18 ENCOUNTER — Inpatient Hospital Stay: Payer: 59 | Admitting: Anesthesiology

## 2016-12-18 ENCOUNTER — Inpatient Hospital Stay
Admission: EM | Admit: 2016-12-18 | Discharge: 2016-12-19 | DRG: 775 | Disposition: A | Discharge status: Home / Self Care | Payer: 59 | Attending: Obstetrics and Gynecology | Admitting: Obstetrics and Gynecology

## 2016-12-18 DIAGNOSIS — Z87891 Personal history of nicotine dependence: Secondary | ICD-10-CM

## 2016-12-18 DIAGNOSIS — K219 Gastro-esophageal reflux disease without esophagitis: Secondary | ICD-10-CM | POA: Diagnosis present

## 2016-12-18 DIAGNOSIS — Z3A40 40 weeks gestation of pregnancy: Secondary | ICD-10-CM | POA: Diagnosis not present

## 2016-12-18 DIAGNOSIS — Z3493 Encounter for supervision of normal pregnancy, unspecified, third trimester: Secondary | ICD-10-CM | POA: Diagnosis present

## 2016-12-18 DIAGNOSIS — O9962 Diseases of the digestive system complicating childbirth: Secondary | ICD-10-CM | POA: Diagnosis present

## 2016-12-18 DIAGNOSIS — Z3490 Encounter for supervision of normal pregnancy, unspecified, unspecified trimester: Secondary | ICD-10-CM

## 2016-12-18 LAB — CBC
HEMATOCRIT: 36.7 % (ref 35.0–47.0)
Hemoglobin: 12.8 g/dL (ref 12.0–16.0)
MCH: 32.3 pg (ref 26.0–34.0)
MCHC: 34.8 g/dL (ref 32.0–36.0)
MCV: 92.8 fL (ref 80.0–100.0)
PLATELETS: 236 10*3/uL (ref 150–440)
RBC: 3.96 MIL/uL (ref 3.80–5.20)
RDW: 14.3 % (ref 11.5–14.5)
WBC: 11.2 10*3/uL — AB (ref 3.6–11.0)

## 2016-12-18 LAB — TYPE AND SCREEN
ABO/RH(D): A POS
ANTIBODY SCREEN: NEGATIVE

## 2016-12-18 MED ORDER — IBUPROFEN 600 MG PO TABS
600.0000 mg | ORAL_TABLET | Freq: Four times a day (QID) | ORAL | Status: DC
  Administered 2016-12-18 – 2016-12-19 (×4): 600 mg via ORAL
  Filled 2016-12-18 (×4): qty 1

## 2016-12-18 MED ORDER — LIDOCAINE-EPINEPHRINE (PF) 1.5 %-1:200000 IJ SOLN
INTRAMUSCULAR | Status: DC | PRN
  Administered 2016-12-18: 3 mL via EPIDURAL

## 2016-12-18 MED ORDER — LIDOCAINE HCL (PF) 1 % IJ SOLN
INTRAMUSCULAR | Status: AC
Start: 2016-12-18 — End: 2016-12-18
  Filled 2016-12-18: qty 30

## 2016-12-18 MED ORDER — KETOROLAC TROMETHAMINE 30 MG/ML IJ SOLN: 30.0000 mg | Freq: Four times a day (QID) | INTRAMUSCULAR | Status: AC | PRN

## 2016-12-18 MED ORDER — SIMETHICONE 80 MG PO CHEW: 80.0000 mg | CHEWABLE_TABLET | ORAL | Status: DC | PRN

## 2016-12-18 MED ORDER — FENTANYL 2.5 MCG/ML W/ROPIVACAINE 0.2% IN NS 100 ML EPIDURAL INFUSION (ARMC-ANES)
EPIDURAL | Status: AC
  Filled 2016-12-18: qty 100

## 2016-12-18 MED ORDER — WITCH HAZEL-GLYCERIN EX PADS: 1.0000 "application " | MEDICATED_PAD | CUTANEOUS | Status: DC | PRN

## 2016-12-18 MED ORDER — COCONUT OIL OIL
1.0000 "application " | TOPICAL_OIL | Status: DC | PRN
  Administered 2016-12-19: 1 via TOPICAL
  Filled 2016-12-18: qty 120

## 2016-12-18 MED ORDER — OXYTOCIN 40 UNITS IN LACTATED RINGERS INFUSION - SIMPLE MED
2.5000 [IU]/h | INTRAVENOUS | Status: DC | PRN
  Filled 2016-12-18: qty 1000

## 2016-12-18 MED ORDER — AMMONIA AROMATIC IN INHA
RESPIRATORY_TRACT | Status: AC
  Filled 2016-12-18: qty 10

## 2016-12-18 MED ORDER — OXYTOCIN BOLUS FROM INFUSION
500.0000 mL | Freq: Once | INTRAVENOUS | Status: AC
  Administered 2016-12-18: 500 mL via INTRAVENOUS

## 2016-12-18 MED ORDER — TETANUS-DIPHTH-ACELL PERTUSSIS 5-2.5-18.5 LF-MCG/0.5 IM SUSP: 0.5000 mL | Freq: Once | INTRAMUSCULAR | Status: DC

## 2016-12-18 MED ORDER — LACTATED RINGERS IV SOLN: 500.0000 mL | INTRAVENOUS | Status: DC | PRN

## 2016-12-18 MED ORDER — OXYCODONE-ACETAMINOPHEN 5-325 MG PO TABS: 1.0000 | ORAL_TABLET | ORAL | Status: DC | PRN

## 2016-12-18 MED ORDER — DOCUSATE SODIUM 100 MG PO CAPS
100.0000 mg | ORAL_CAPSULE | Freq: Two times a day (BID) | ORAL | Status: DC
  Administered 2016-12-19 (×2): 100 mg via ORAL
  Filled 2016-12-18 (×2): qty 1

## 2016-12-18 MED ORDER — SODIUM CHLORIDE 0.9% FLUSH: 3.0000 mL | INTRAVENOUS | Status: DC | PRN

## 2016-12-18 MED ORDER — LIDOCAINE HCL (PF) 1 % IJ SOLN
INTRAMUSCULAR | Status: DC | PRN
  Administered 2016-12-18: 3 mL via SUBCUTANEOUS

## 2016-12-18 MED ORDER — LACTATED RINGERS IV SOLN
INTRAVENOUS | Status: DC
  Administered 2016-12-18: 08:00:00 via INTRAVENOUS

## 2016-12-18 MED ORDER — NALBUPHINE HCL 10 MG/ML IJ SOLN: 5.0000 mg | INTRAMUSCULAR | Status: DC | PRN

## 2016-12-18 MED ORDER — FENTANYL 2.5 MCG/ML W/ROPIVACAINE 0.2% IN NS 100 ML EPIDURAL INFUSION (ARMC-ANES): 10.0000 mL/h | EPIDURAL | Status: DC

## 2016-12-18 MED ORDER — DIPHENHYDRAMINE HCL 50 MG/ML IJ SOLN: 12.5000 mg | INTRAMUSCULAR | Status: DC | PRN

## 2016-12-18 MED ORDER — ACETAMINOPHEN 325 MG PO TABS: 650.0000 mg | ORAL_TABLET | ORAL | Status: DC | PRN

## 2016-12-18 MED ORDER — DIBUCAINE 1 % RE OINT: 1.0000 "application " | TOPICAL_OINTMENT | RECTAL | Status: DC | PRN

## 2016-12-18 MED ORDER — MISOPROSTOL 200 MCG PO TABS
ORAL_TABLET | ORAL | Status: AC
  Filled 2016-12-18: qty 4

## 2016-12-18 MED ORDER — PRENATAL MULTIVITAMIN CH
1.0000 | ORAL_TABLET | Freq: Every day | ORAL | Status: DC
  Administered 2016-12-19: 1 via ORAL
  Filled 2016-12-18: qty 1

## 2016-12-18 MED ORDER — FENTANYL 2.5 MCG/ML W/ROPIVACAINE 0.2% IN NS 100 ML EPIDURAL INFUSION (ARMC-ANES)
EPIDURAL | Status: DC | PRN
  Administered 2016-12-18: 10 mL/h via EPIDURAL

## 2016-12-18 MED ORDER — SODIUM CHLORIDE 0.9 % IV SOLN
INTRAVENOUS | Status: DC | PRN
  Administered 2016-12-18 (×2): 5 mL via EPIDURAL

## 2016-12-18 MED ORDER — OXYTOCIN 10 UNIT/ML IJ SOLN
INTRAMUSCULAR | Status: AC
  Filled 2016-12-18: qty 2

## 2016-12-18 MED ORDER — DIPHENHYDRAMINE HCL 25 MG PO CAPS
25.0000 mg | ORAL_CAPSULE | Freq: Four times a day (QID) | ORAL | Status: DC | PRN
Start: 2016-12-18 — End: 2016-12-19

## 2016-12-18 MED ORDER — NALBUPHINE HCL 10 MG/ML IJ SOLN: 5.0000 mg | Freq: Once | INTRAMUSCULAR | Status: DC | PRN

## 2016-12-18 MED ORDER — DIPHENHYDRAMINE HCL 25 MG PO CAPS: 25.0000 mg | ORAL_CAPSULE | ORAL | Status: DC | PRN

## 2016-12-18 MED ORDER — BENZOCAINE-MENTHOL 20-0.5 % EX AERO: 1.0000 "application " | INHALATION_SPRAY | CUTANEOUS | Status: DC | PRN

## 2016-12-18 MED ORDER — MEPERIDINE HCL 25 MG/ML IJ SOLN: 6.2500 mg | INTRAMUSCULAR | Status: DC | PRN

## 2016-12-18 MED ORDER — NALOXONE HCL 0.4 MG/ML IJ SOLN: 0.4000 mg | INTRAMUSCULAR | Status: DC | PRN

## 2016-12-18 MED ORDER — OXYCODONE-ACETAMINOPHEN 5-325 MG PO TABS: 2.0000 | ORAL_TABLET | ORAL | Status: DC | PRN

## 2016-12-18 MED ORDER — ONDANSETRON HCL 4 MG/2ML IJ SOLN: 4.0000 mg | Freq: Three times a day (TID) | INTRAMUSCULAR | Status: DC | PRN

## 2016-12-18 MED ORDER — NALOXONE HCL 2 MG/2ML IJ SOSY
1.0000 ug/kg/h | PREFILLED_SYRINGE | INTRAVENOUS | Status: DC | PRN
  Filled 2016-12-18: qty 2

## 2016-12-18 MED ORDER — ZOLPIDEM TARTRATE 5 MG PO TABS: 5.0000 mg | ORAL_TABLET | Freq: Every evening | ORAL | Status: DC | PRN

## 2016-12-18 MED ORDER — OXYTOCIN 40 UNITS IN LACTATED RINGERS INFUSION - SIMPLE MED
2.5000 [IU]/h | INTRAVENOUS | Status: DC
  Administered 2016-12-18: 2.5 [IU]/h via INTRAVENOUS

## 2016-12-18 MED ORDER — OXYTOCIN 40 UNITS IN LACTATED RINGERS INFUSION - SIMPLE MED
INTRAVENOUS | Status: AC
Start: 2016-12-18 — End: 2016-12-18
  Filled 2016-12-18: qty 1000

## 2016-12-18 NOTE — OB Triage Note (Signed)
Patient came in with complaints of contractions every 4-6 mins since 2300 last night. Denies vaginal bleeding and denies vaginal discharge. Reports positive fetal movement. Denies sexual intercourse in the last 24hrs.

## 2016-12-18 NOTE — Progress Notes (Signed)
Brianna GlasgowChristina E Washington is a 36 y.o. G2P1001 at 137w3d  admitted for active labor  Subjective:   Objective: BP 125/60   Pulse 72   Temp 98.2 F (36.8 C) (Oral)   Resp 18   Ht 5\' 4"  (1.626 m)   Wt 197 lb (89.4 kg)   LMP 03/10/2016   SpO2 98%   BMI 33.81 kg/m  No intake/output data recorded. No intake/output data recorded.  FHT:  FHR: 150 bpm, variability: moderate,  accelerations:  Present,  decelerations:  Absent UC:   regular, every 3-4 minutes SVE:   7-8 cm - 80% AROM - clear fluid  Labs: Lab Results  Component Value Date   WBC 11.2 (H) 12/18/2016   HGB 12.8 12/18/2016   HCT 36.7 12/18/2016   MCV 92.8 12/18/2016   PLT 236 12/18/2016    Assessment / Plan: Spontaneous labor, progressing normally  Labor: Progressing normally Preeclampsia:  none Fetal Wellbeing:  Category I Pain Control:  Epidural I/D:   Anticipated MOD:  NSVD  Brennan Baileyavid Evans 12/18/2016, 10:13 AM

## 2016-12-18 NOTE — Lactation Note (Signed)
This note was copied from a baby's chart. Assisted mom with positioning with pillows and latching in cross cradle hold on left breast and football hold on right breast.  It took several attempts to achieve deep enough latch because he was curling his tongue upward.  After several good rhythmic sucks, he would curl lower lip inward and lose suction ending up with shallow latch.  When we would try to lower chin to flip lower lip outward, he would come off the breast completely fussing.  He was on and off both breasts taking several good rhythmic sucks and then coming off fussing.

## 2016-12-18 NOTE — Anesthesia Procedure Notes (Signed)
Epidural Patient location during procedure: OB Start time: 12/18/2016 8:31 AM End time: 12/18/2016 8:41 AM  Staffing Anesthesiologist: Lenard SimmerKARENZ, Pilar Corrales Performed: anesthesiologist   Preanesthetic Checklist Completed: patient identified, site marked, surgical consent, pre-op evaluation, timeout performed, IV checked, risks and benefits discussed and monitors and equipment checked  Epidural Patient position: sitting Prep: ChloraPrep Patient monitoring: heart rate, continuous pulse ox and blood pressure Approach: midline Location: L3-L4 Injection technique: LOR saline  Needle:  Needle type: Tuohy  Needle gauge: 17 G Needle length: 9 cm and 9 Needle insertion depth: 6 cm Catheter type: closed end flexible Catheter size: 19 Gauge Catheter at skin depth: 11 cm Test dose: negative and 1.5% lidocaine with Epi 1:200 K  Assessment Sensory level: T10 Events: blood not aspirated, injection not painful, no injection resistance, negative IV test and no paresthesia  Additional Notes Pt. Evaluated and documentation done after procedure finished. Patient identified. Risks/Benefits/Options discussed with patient including but not limited to bleeding, infection, nerve damage, paralysis, failed block, incomplete pain control, headache, blood pressure changes, nausea, vomiting, reactions to medication both or allergic, itching and postpartum back pain. Confirmed with bedside nurse the patient's most recent platelet count. Confirmed with patient that they are not currently taking any anticoagulation, have any bleeding history or any family history of bleeding disorders. Patient expressed understanding and wished to proceed. All questions were answered. Sterile technique was used throughout the entire procedure. Please see nursing notes for vital signs. Test dose was given through epidural catheter and negative prior to continuing to dose epidural or start infusion. Warning signs of high block given to the  patient including shortness of breath, tingling/numbness in hands, complete motor block, or any concerning symptoms with instructions to call for help. Patient was given instructions on fall risk and not to get out of bed. All questions and concerns addressed with instructions to call with any issues or inadequate analgesia.   Patient tolerated the insertion well without immediate complications.Reason for block:procedure for pain

## 2016-12-18 NOTE — Anesthesia Preprocedure Evaluation (Signed)
Anesthesia Evaluation  Patient identified by MRN, date of birth, ID band Patient awake    Reviewed: Allergy & Precautions, H&P , NPO status , Patient's Chart, lab work & pertinent test results, reviewed documented beta blocker date and time   History of Anesthesia Complications Negative for: history of anesthetic complications  Airway Mallampati: III  TM Distance: >3 FB Neck ROM: full    Dental  (+) Teeth Intact   Pulmonary neg shortness of breath, neg sleep apnea, neg COPD, neg recent URI, Current Smoker,           Cardiovascular Exercise Tolerance: Good negative cardio ROS       Neuro/Psych PSYCHIATRIC DISORDERS (Depression) negative neurological ROS     GI/Hepatic Neg liver ROS, GERD  ,  Endo/Other  negative endocrine ROS  Renal/GU negative Renal ROS  negative genitourinary   Musculoskeletal   Abdominal   Peds  Hematology negative hematology ROS (+)   Anesthesia Other Findings Past Medical History: No date: Allergy No date: Depression No date: Tremors of nervous system     Comment: Essential Tremors (labetalol)   Reproductive/Obstetrics (+) Pregnancy                             Anesthesia Physical Anesthesia Plan  ASA: II  Anesthesia Plan: Epidural   Post-op Pain Management:    Induction:   PONV Risk Score and Plan:   Airway Management Planned:   Additional Equipment:   Intra-op Plan:   Post-operative Plan:   Informed Consent: I have reviewed the patients History and Physical, chart, labs and discussed the procedure including the risks, benefits and alternatives for the proposed anesthesia with the patient or authorized representative who has indicated his/her understanding and acceptance.   Dental Advisory Given  Plan Discussed with: Anesthesiologist, CRNA and Surgeon  Anesthesia Plan Comments:         Anesthesia Quick Evaluation

## 2016-12-19 ENCOUNTER — Encounter: Payer: 59 | Admitting: Obstetrics and Gynecology

## 2016-12-19 LAB — RPR: RPR: NONREACTIVE

## 2016-12-19 MED ORDER — CALCIUM CARBONATE ANTACID 500 MG PO CHEW
1.0000 | CHEWABLE_TABLET | ORAL | Status: DC | PRN
  Administered 2016-12-19: 200 mg via ORAL
  Filled 2016-12-19: qty 1

## 2016-12-19 NOTE — H&P (Signed)
History and Physical   HPI  Brianna Washington is a 36 y.o. G2P2001 at [redacted]w[redacted]d Estimated Date of Delivery: 12/15/16 who is being admitted for  labor management   OB History  Obstetric History   G2   P2   T2   P0   A0   L1    SAB0   TAB0   Ectopic0   Multiple0   Live Births1     # Outcome Date GA Lbr Len/2nd Weight Sex Delivery Anes PTL Lv  2 Term 12/18/16 [redacted]w[redacted]d / 01:31  M Vag-Spont EPI       Name: Profeta,BOY Tanai  1 Term 08/21/14 [redacted]w[redacted]d  6 lb 6 oz (2.892 kg) F Vag-Spont EPI N LIV     Apgar1:  8                Apgar5: 9      PROBLEM LIST  Pregnancy complications or risks: Patient Active Problem List   Diagnosis Date Noted  . Normal labor 12/18/2016  . Gastroesophageal reflux in pregnancy 09/27/2016  . Pregnancy 09/20/2016  . Overweight (BMI 25.0-29.9) 06/12/2016  . Supervision of normal intrauterine pregnancy in multigravida in second trimester 06/12/2016  . Mild tobacco abuse 06/09/2016  . Fatigue due to depression 03/01/2016  . 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 Obesity (BMI 30.0-34.9) 08/02/2012  . Essential tremor 08/02/2012  . Allergic rhinitis 11/19/2008    Prenatal labs and studies: ABO, Rh: --/--/A POS (06/10 4098) Antibody: NEG (06/10 0817) Rubella: 2.05 (10/30 1541) RPR: Non Reactive (06/10 0725)  HBsAg: Negative (10/30 1541)  HIV: Non Reactive (10/30 1541)  JXB:JYNWGNFA (05/16 0000)   Past Medical History:  Diagnosis Date  . Allergy   . Depression   . Tremors of nervous system    Essential Tremors (labetalol)     Past Surgical History:  Procedure Laterality Date  . TONSILLECTOMY  2010     Medications    Current Discharge Medication List    CONTINUE these medications which have NOT CHANGED   Details  cetirizine (ZYRTEC) 10 MG tablet Take 10 mg by mouth daily.    docusate sodium (COLACE) 100 MG capsule Take 100 mg by mouth 2 (two) times daily.    labetalol (NORMODYNE) 100 MG tablet TAKE 1 TABLET BY MOUTH  TWICE DAILY Qty: 60 tablet, Refills: 5    loratadine (CLARITIN) 10 MG tablet Take 10 mg by mouth daily.    pantoprazole (PROTONIX) 20 MG tablet Take 1 tablet (20 mg total) by mouth 2 (two) times daily. Qty: 60 tablet, Refills: 5    Prenat-FeFum-FePo-FA-Omega 3 (CONCEPT DHA) 53.5-38-1 MG CAPS Take 53.5 mg by mouth daily. Qty: 30 capsule, Refills: 11   Associated Diagnoses: Supervision of normal intrauterine pregnancy in multigravida in second trimester         Allergies  Patient has no known allergies.  Review of Systems  Pertinent items noted in HPI and remainder of comprehensive ROS otherwise negative.  Physical Exam  BP 114/68 (BP Location: Left Arm)   Pulse 80   Temp 98.5 F (36.9 C) (Oral)   Resp 16   Ht 5\' 4"  (1.626 m)   Wt 197 lb (89.4 kg)   LMP 03/10/2016   SpO2 98%   Breastfeeding? Unknown   BMI 33.81 kg/m   Lungs:  CTA B Cardio: RRR without M/R/G Abd: Soft, gravid, NT Presentation: cephalic EXT: No C/C/ 1+ Edema DTRs: 2+ B CERVIX: 4:75%:   See  Prenatal records for more detailed PE.    FHR:  Variability: Good {> 6 bpm)  Toco: Uterine Contractions: Frequency: Every 3 minutes   Test Results  No results found for this or any previous visit (from the past 24 hour(s)). Group B Strep negative  Assessment   G2P2001 at 7341w3d Estimated Date of Delivery: 12/15/16  The fetus is reassuring.   Patient Active Problem List   Diagnosis Date Noted  . Normal labor 12/18/2016  . Gastroesophageal reflux in pregnancy 09/27/2016  . Pregnancy 09/20/2016  . Overweight (BMI 25.0-29.9) 06/12/2016  . Supervision of normal intrauterine pregnancy in multigravida in second trimester 06/12/2016  . Mild tobacco abuse 06/09/2016  . Fatigue due to depression 03/01/2016  . 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 Obesity (BMI 30.0-34.9) 08/02/2012  . Essential tremor 08/02/2012  . Allergic rhinitis 11/19/2008    Plan  1. Admit to L&D for anticipate  vaginal delivery 2. EFM: -- Category 1 3. Epidural if desired. Stadol for IV pain until epidural requested. 4. Admission labs    Elonda Huskyavid J. Vondell Babers, M.D. 12/19/2016 1:16 PM

## 2016-12-19 NOTE — Progress Notes (Signed)
Pt discharged home with infant.  Discharge instructions and follow up appointment given to and reviewed with pt.  Pt verbalized understanding.  Escorted by auxillary. 

## 2016-12-19 NOTE — Discharge Summary (Signed)
                               Discharge Summary  Date of Admission: 12/18/2016  Date of Discharge:   Admitting Diagnosis: Onset of Labor at 6127w3d  Secondary Diagnosis:   Mode of Delivery: normal spontaneous vaginal delivery       Discharge Diagnosis: PP   Intrapartum Procedures: Atificial rupture of membranes and epidural   Post partum procedures: none  Complications: none                      Discharge Day SOAP Note:  Progress Note - Vaginal Delivery  Brianna Washington is a 36 y.o. G2P2001 now PP day 1 s/p Vaginal, Spontaneous Delivery . Delivery was uncomplicated  Subjective  The patient has the following complaints: has no unusual complaints  Pain is controlled with current medications.   Patient is urinating without difficulty.  She is ambulating well.    Objective  Vital signs: BP 114/68 (BP Location: Left Arm)   Pulse 80   Temp 98.5 F (36.9 C) (Oral)   Resp 16   Ht 5\' 4"  (1.626 m)   Wt 197 lb (89.4 kg)   LMP 03/10/2016   SpO2 98%   Breastfeeding? Unknown   BMI 33.81 kg/m   Physical Exam: Gen: NAD Fundus Fundal Tone: Firm  Lochia Amount: Small  Perineum Appearance: Intact     Data Review Labs: CBC Latest Ref Rng & Units 12/18/2016 09/27/2016 05/09/2016  WBC 3.6 - 11.0 K/uL 11.2(H) - 8.6  Hemoglobin 12.0 - 16.0 g/dL 40.912.8 81.111.4 91.413.3  Hematocrit 35.0 - 47.0 % 36.7 33.8(L) 39.8  Platelets 150 - 440 K/uL 236 - 237   A POS  Assessment/Plan  Active Problems:   Pregnancy   Normal labor    Plan for discharge today.   Discharge Instructions: Per After Visit Summary. Activity: Advance as tolerated. Pelvic rest for 6 weeks.  Also refer to After Visit Summary Diet: Regular Medications:  Outpatient follow up:  Follow-up Information    Linzie CollinEvans, David James, MD Follow up in 6 week(s).   Specialty:  Obstetrics and Gynecology Contact information: [address] [place] [address] [phone]          Postpartum  contraception: considering IUD vs nexplanon  Discharged Condition: good  Discharged to: home  Newborn Data: Disposition:home with mother  Apgars: APGAR (1 MIN):   APGAR (5 MINS):   APGAR (10 MINS):    Baby Feeding: Breast    Elonda Huskyavid J. Evans, M.D. 12/19/2016 3:18 PM

## 2016-12-19 NOTE — Anesthesia Postprocedure Evaluation (Signed)
Anesthesia Post Note  Patient: Brianna GlasgowChristina E Washington  Procedure(s) Performed: * No procedures listed *  Patient location during evaluation: Mother Baby Anesthesia Type: Epidural Level of consciousness: awake, awake and alert and oriented Pain management: pain level controlled Vital Signs Assessment: post-procedure vital signs reviewed and stable Respiratory status: spontaneous breathing Cardiovascular status: blood pressure returned to baseline Postop Assessment: no headache, no backache, no signs of nausea or vomiting and adequate PO intake Anesthetic complications: no     Last Vitals:  Vitals:   12/18/16 2355 12/19/16 0424  BP: (!) 108/59 (!) 98/56  Pulse: 74 81  Resp: 16 14  Temp: 36.9 C 36.9 C    Last Pain:  Vitals:   12/19/16 0613  TempSrc:   PainSc: 0-No pain                 Maram Bently Lawerance CruelStarr

## 2017-01-23 ENCOUNTER — Encounter: Payer: Self-pay | Admitting: Obstetrics and Gynecology

## 2017-01-30 ENCOUNTER — Ambulatory Visit (INDEPENDENT_AMBULATORY_CARE_PROVIDER_SITE_OTHER): Payer: 59 | Admitting: Obstetrics and Gynecology

## 2017-01-30 ENCOUNTER — Encounter: Payer: Self-pay | Admitting: Obstetrics and Gynecology

## 2017-01-30 VITALS — BP 112/75 | HR 107 | Resp 16 | Wt 184.0 lb

## 2017-01-30 DIAGNOSIS — Z30430 Encounter for insertion of intrauterine contraceptive device: Secondary | ICD-10-CM

## 2017-01-30 MED ORDER — LEVONORGESTREL 20 MCG/24HR IU IUD
INTRAUTERINE_SYSTEM | Freq: Once | INTRAUTERINE | Status: AC
  Administered 2017-01-30: 14:00:00 via INTRAUTERINE

## 2017-01-30 NOTE — Addendum Note (Signed)
Addended by: Marchelle FolksMILLER, Ashleymarie Granderson G on: 01/30/2017 01:56 PM   Modules accepted: Orders

## 2017-01-30 NOTE — Progress Notes (Signed)
HPI:      [name] is a 35 y.o. G2P2001 who LMP was No LMP recorded.  Subjective:   She presents today 6 weeks postpartum. She has not had a menstrual period she has not resumed intercourse. She is no complaints. She is breast-feeding full-time. She would like an IUD for birth control. She previously had an IUD.    Hx: The following portions of the patient's history were reviewed and updated as appropriate:             She  has a past medical history of Allergy; Depression; and Tremors of nervous system. She  does not have any pertinent problems on file. She  has a past surgical history that includes Tonsillectomy (2010). Her family history includes Cancer in her paternal grandmother; Cancer (age of onset: 78) in her maternal grandmother; Diabetes in her paternal grandmother; Heart attack in her maternal grandfather; Heart disease in her maternal grandfather and paternal grandfather; Hyperlipidemia in her father and mother; Hypertension in her mother; Hyperthyroidism in her mother. She  reports that she has been smoking Cigarettes.  She has been smoking about 0.25 packs per day. She has never used smokeless tobacco. She reports that she drinks alcohol. She reports that she does not use drugs. She has No Known Allergies.       Review of Systems:  Review of Systems  Constitutional: Denied constitutional symptoms, night sweats, recent illness, fatigue, fever, insomnia and weight loss.  Eyes: Denied eye symptoms, eye pain, photophobia, vision change and visual disturbance.  Ears/Nose/Throat/Neck: Denied ear, nose, throat or neck symptoms, hearing loss, nasal discharge, sinus congestion and sore throat.  Cardiovascular: Denied cardiovascular symptoms, arrhythmia, chest pain/pressure, edema, exercise intolerance, orthopnea and palpitations.  Respiratory: Denied pulmonary symptoms, asthma, pleuritic pain, productive sputum, cough, dyspnea and wheezing.  Gastrointestinal: Denied,  gastro-esophageal reflux, melena, nausea and vomiting.  Genitourinary: Denied genitourinary symptoms including symptomatic vaginal discharge, pelvic relaxation issues, and urinary complaints.  Musculoskeletal: Denied musculoskeletal symptoms, stiffness, swelling, muscle weakness and myalgia.  Dermatologic: Denied dermatology symptoms, rash and scar.  Neurologic: Denied neurology symptoms, dizziness, headache, neck pain and syncope.  Psychiatric: Denied psychiatric symptoms, anxiety and depression.  Endocrine: Denied endocrine symptoms including hot flashes and night sweats.   Meds:   Current Outpatient Prescriptions on File Prior to Visit  Medication Sig Dispense Refill  . cetirizine (ZYRTEC) 10 MG tablet Take 10 mg by mouth daily.    Marland Kitchen labetalol (NORMODYNE) 100 MG tablet TAKE 1 TABLET BY MOUTH TWICE DAILY 60 tablet 5  . loratadine (CLARITIN) 10 MG tablet Take 10 mg by mouth daily.    . Prenat-FeFum-FePo-FA-Omega 3 (CONCEPT DHA) 53.5-38-1 MG CAPS Take 53.5 mg by mouth daily. 30 capsule 11  . pantoprazole (PROTONIX) 20 MG tablet Take 1 tablet (20 mg total) by mouth 2 (two) times daily. (Patient not taking: Reported on 01/30/2017) 60 tablet 5   No current facility-administered medications on file prior to visit.     Objective:     Vitals:   01/30/17 0932  BP: 112/75  Pulse: (!) 107  Resp: 16              Physical examination   Pelvic:   Vulva: Normal appearance.  No lesions.  Vagina: No lesions or abnormalities noted.  Support: Normal pelvic support.  Urethra No masses tenderness or scarring.  Meatus Normal size without lesions or prolapse.  Cervix: Normal appearance.  No lesions.  Anus: Normal exam.  No lesions.  Perineum: Normal exam.  No lesions.        Bimanual   Uterus: Normal size.  Non-tender.  Mobile.  AV.  Adnexae: No masses.  Non-tender to palpation.  Cul-de-sac: Negative for abnormality.   IUD Procedure Pt has read the booklet and signed the appropriate forms  regarding the Mirena IUD.  All of her questions have been answered.   The cervix was cleansed with betadine solution.  After sounding the uterus and noting the position, the IUD was placed in the usual manner without problem.  The string was cut to the appropriate length.  The patient tolerated the procedure well.    Assessment:    G2P2001 Patient Active Problem List   Diagnosis Date Noted  . Normal labor 12/18/2016  . Gastroesophageal reflux in pregnancy 09/27/2016  . Pregnancy 09/20/2016  . Overweight (BMI 25.0-29.9) 06/12/2016  . Supervision of normal intrauterine pregnancy in multigravida in second trimester 06/12/2016  . Mild tobacco abuse 06/09/2016  . Fatigue due to depression 03/01/2016  . 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 Obesity (BMI 30.0-34.9) 08/02/2012  . Essential tremor 08/02/2012  . Allergic rhinitis 11/19/2008     1. Encounter for insertion of mirena IUD   2. Postpartum care and examination immediately after delivery        Plan:            1.  Patient may resume normal activities.  2.  IUD follow-up in 4 weeks. Orders No orders of the defined types were placed in this encounter.   No orders of the defined types were placed in this encounter.       F/U  Return in about 4 weeks (around 02/27/2017).  Elonda Huskyavid J. Evans, M.D. 01/30/2017 11:02 AM

## 2017-03-01 ENCOUNTER — Encounter: Payer: Self-pay | Admitting: Obstetrics and Gynecology

## 2017-03-01 ENCOUNTER — Ambulatory Visit (INDEPENDENT_AMBULATORY_CARE_PROVIDER_SITE_OTHER): Payer: 59 | Admitting: Obstetrics and Gynecology

## 2017-03-01 VITALS — BP 107/67 | HR 88 | Ht 64.0 in | Wt 187.4 lb

## 2017-03-01 DIAGNOSIS — Z30431 Encounter for routine checking of intrauterine contraceptive device: Secondary | ICD-10-CM | POA: Diagnosis not present

## 2017-03-01 NOTE — Progress Notes (Signed)
HPI:      [name] is a 36 y.o. G2P2001 who LMP was No LMP recorded. Patient is not currently having periods (Reason: IUD).  Subjective:   She presents today For follow-up of her IUD. She states that she continues to have some amount of spotting every day but is doing well with this. She has had intercourse without problems. The strings do not seem to be an issue for her. She continues to breast-feed full-time. She is taking prenatal vitamins. She has no other complaints.    Hx: The following portions of the patient's history were reviewed and updated as appropriate:             She  has a past medical history of Allergy; Depression; and Tremors of nervous system. She  does not have any pertinent problems on file. She  has a past surgical history that includes Tonsillectomy (2010). Her family history includes Cancer in her paternal grandmother; Cancer (age of onset: 38) in her maternal grandmother; Diabetes in her paternal grandmother; Heart attack in her maternal grandfather; Heart disease in her maternal grandfather and paternal grandfather; Hyperlipidemia in her father and mother; Hypertension in her mother; Hyperthyroidism in her mother. She  reports that she has been smoking Cigarettes.  She has been smoking about 0.25 packs per day. She has never used smokeless tobacco. She reports that she drinks alcohol. She reports that she does not use drugs. She has No Known Allergies.       Review of Systems:  Review of Systems  Constitutional: Denied constitutional symptoms, night sweats, recent illness, fatigue, fever, insomnia and weight loss.  Eyes: Denied eye symptoms, eye pain, photophobia, vision change and visual disturbance.  Ears/Nose/Throat/Neck: Denied ear, nose, throat or neck symptoms, hearing loss, nasal discharge, sinus congestion and sore throat.  Cardiovascular: Denied cardiovascular symptoms, arrhythmia, chest pain/pressure, edema, exercise intolerance, orthopnea and  palpitations.  Respiratory: Denied pulmonary symptoms, asthma, pleuritic pain, productive sputum, cough, dyspnea and wheezing.  Gastrointestinal: Denied, gastro-esophageal reflux, melena, nausea and vomiting.  Genitourinary: Denied genitourinary symptoms including symptomatic vaginal discharge, pelvic relaxation issues, and urinary complaints.  Musculoskeletal: Denied musculoskeletal symptoms, stiffness, swelling, muscle weakness and myalgia.  Dermatologic: Denied dermatology symptoms, rash and scar.  Neurologic: Denied neurology symptoms, dizziness, headache, neck pain and syncope.  Psychiatric: Denied psychiatric symptoms, anxiety and depression.  Endocrine: Denied endocrine symptoms including hot flashes and night sweats.   Meds:   Current Outpatient Prescriptions on File Prior to Visit  Medication Sig Dispense Refill  . cetirizine (ZYRTEC) 10 MG tablet Take 10 mg by mouth daily.    . Prenat-FeFum-FePo-FA-Omega 3 (CONCEPT DHA) 53.5-38-1 MG CAPS Take 53.5 mg by mouth daily. 30 capsule 11  . labetalol (NORMODYNE) 100 MG tablet TAKE 1 TABLET BY MOUTH TWICE DAILY (Patient not taking: Reported on 03/01/2017) 60 tablet 5  . loratadine (CLARITIN) 10 MG tablet Take 10 mg by mouth daily.    . pantoprazole (PROTONIX) 20 MG tablet Take 1 tablet (20 mg total) by mouth 2 (two) times daily. (Patient not taking: Reported on 01/30/2017) 60 tablet 5   No current facility-administered medications on file prior to visit.     Objective:     Vitals:   03/01/17 0838  BP: 107/67  Pulse: 88              Physical examination   Pelvic:   Vulva: Normal appearance.  No lesions.  Vagina: No lesions or abnormalities noted.  Support: Normal pelvic support.  Urethra No masses tenderness or scarring.  Meatus Normal size without lesions or prolapse.  Cervix: Normal appearance.  No lesions. IUD strings noted at cervical os.  Anus: Normal exam.  No lesions.  Perineum: Normal exam.  No lesions.        Bimanual    Uterus: Normal size.  Non-tender.  Mobile.  AV.  Adnexae: No masses.  Non-tender to palpation.  Cul-de-sac: Negative for abnormality.     Assessment:    G2P2001 Patient Active Problem List   Diagnosis Date Noted  . Normal labor 12/18/2016  . Gastroesophageal reflux in pregnancy 09/27/2016  . Pregnancy 09/20/2016  . Overweight (BMI 25.0-29.9) 06/12/2016  . Supervision of normal intrauterine pregnancy in multigravida in second trimester 06/12/2016  . Mild tobacco abuse 06/09/2016  . Fatigue due to depression 03/01/2016  . 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 Obesity (BMI 30.0-34.9) 08/02/2012  . Essential tremor 08/02/2012  . Allergic rhinitis 11/19/2008     1. Surveillance of previously prescribed intrauterine contraceptive device     Patient doing well with the exception of some daily spotting.   Plan:            1.  Expect resolution of spotting within 2 weeks. Consider OCPs for 1 month of spotting does not seem resolved and the patient would like a break from bleeding. She is to call if she desires OCPs for 1 month.   2.  Follow-up will be for annual examination when next Pap due. Orders No orders of the defined types were placed in this encounter.   No orders of the defined types were placed in this encounter.       F/U  No Follow-up on file.  Elonda Husky, M.D. 03/01/2017 9:02 AM

## 2017-05-15 ENCOUNTER — Encounter: Payer: 59 | Admitting: Internal Medicine

## 2017-05-26 ENCOUNTER — Encounter: Payer: 59 | Admitting: Obstetrics and Gynecology

## 2017-06-14 ENCOUNTER — Ambulatory Visit (INDEPENDENT_AMBULATORY_CARE_PROVIDER_SITE_OTHER): Payer: 59 | Admitting: Obstetrics and Gynecology

## 2017-06-14 ENCOUNTER — Encounter: Payer: Self-pay | Admitting: Obstetrics and Gynecology

## 2017-06-14 VITALS — BP 109/68 | HR 91 | Ht 64.0 in | Wt 190.1 lb

## 2017-06-14 DIAGNOSIS — Z720 Tobacco use: Secondary | ICD-10-CM | POA: Diagnosis not present

## 2017-06-14 DIAGNOSIS — Z01419 Encounter for gynecological examination (general) (routine) without abnormal findings: Secondary | ICD-10-CM

## 2017-06-14 DIAGNOSIS — E669 Obesity, unspecified: Secondary | ICD-10-CM | POA: Diagnosis not present

## 2017-06-14 DIAGNOSIS — Z975 Presence of (intrauterine) contraceptive device: Secondary | ICD-10-CM

## 2017-06-14 DIAGNOSIS — F17200 Nicotine dependence, unspecified, uncomplicated: Secondary | ICD-10-CM

## 2017-06-14 DIAGNOSIS — Z124 Encounter for screening for malignant neoplasm of cervix: Secondary | ICD-10-CM | POA: Diagnosis not present

## 2017-06-14 NOTE — Patient Instructions (Signed)

## 2017-06-14 NOTE — Progress Notes (Addendum)
GYNECOLOGY ANNUAL PHYSICAL EXAM PROGRESS NOTE  Subjective:    Brianna Washington is a 36 y.o. G14P2001 female who presents for an annual exam. The patient has no complaints today. The patient is sexually active. The patient wears seatbelts: yes. The patient participates in regular exercise: no. Has the patient ever been transfused or tattooed?: no. The patient reports that there is not domestic violence in her life.    Gynecologic History                                                                Menarche age: 27 No LMP recorded. Patient is not currently having periods (Reason: IUD). Contraception: Mirena IUD (inserted 01/30/2017).  History of STI's: Denies Last Pap: 06/25/2013. Results were: normal.  Denies h/o abnormal pap smears    Obstetric History   G2   P2   T2   P0   A0   L1    SAB0   TAB0   Ectopic0   Multiple0   Live Births1     # Outcome Date GA Lbr Len/2nd Weight Sex Delivery Anes PTL Lv  2 Term 12/18/16 [redacted]w[redacted]d / 01:31 7 lb 8.3 oz (3.41 kg) M Vag-Spont EPI       Name: Hardigree,BOY Brilee  1 Term 08/21/14 [redacted]w[redacted]d  6 lb 6 oz (2.892 kg) F Vag-Spont EPI N LIV     Apgar1:  8                Apgar5: 9      Past Medical History:  Diagnosis Date  . Allergy   . Depression   . Tremors of nervous system    Essential Tremors (labetalol)    Past Surgical History:  Procedure Laterality Date  . TONSILLECTOMY  2010    Family History  Problem Relation Age of Onset  . Hyperlipidemia Mother   . Hypertension Mother   . Hyperthyroidism Mother   . Hyperlipidemia Father   . Cancer Maternal Grandmother 12       metastatic breast ca   . Heart attack Maternal Grandfather   . Heart disease Maternal Grandfather        AMI  . Cancer Paternal Grandmother        NOn-Hodgkins Lymphoma  . Diabetes Paternal Grandmother   . Heart disease Paternal Grandfather     Social History   Socioeconomic History  . Marital status: Married    Spouse name: Not on file  . Number of  children: Not on file  . Years of education: Not on file  . Highest education level: Not on file  Social Needs  . Financial resource strain: Not on file  . Food insecurity - worry: Not on file  . Food insecurity - inability: Not on file  . Transportation needs - medical: Not on file  . Transportation needs - non-medical: Not on file  Occupational History  . Not on file  Tobacco Use  . Smoking status: Current Every Day Smoker    Packs/day: 0.25    Types: Cigarettes    Last attempt to quit: 09/23/2015    Years since quitting: 1.7  . Smokeless tobacco: Never Used  Substance and Sexual Activity  . Alcohol use: Yes    Comment: once a week  5oz  . Drug use: No  . Sexual activity: Yes    Birth control/protection: IUD  Other Topics Concern  . Not on file  Social History Narrative  . Not on file    Current Outpatient Medications on File Prior to Visit  Medication Sig Dispense Refill  . cetirizine (ZYRTEC) 10 MG tablet Take 10 mg by mouth daily.    Marland Kitchen. loratadine (CLARITIN) 10 MG tablet Take 10 mg by mouth daily.    Marland Kitchen. labetalol (NORMODYNE) 100 MG tablet TAKE 1 TABLET BY MOUTH TWICE DAILY (Patient not taking: Reported on 03/01/2017) 60 tablet 5  . pantoprazole (PROTONIX) 20 MG tablet Take 1 tablet (20 mg total) by mouth 2 (two) times daily. (Patient not taking: Reported on 01/30/2017) 60 tablet 5  . Prenat-FeFum-FePo-FA-Omega 3 (CONCEPT DHA) 53.5-38-1 MG CAPS Take 53.5 mg by mouth daily. (Patient not taking: Reported on 06/14/2017) 30 capsule 11   No current facility-administered medications on file prior to visit.     No Known Allergies    Review of Systems Constitutional: negative for chills, fatigue, fevers and sweats Eyes: negative for irritation, redness and visual disturbance Ears, nose, mouth, throat, and face: negative for hearing loss, nasal congestion, snoring and tinnitus Respiratory: negative for asthma, cough, sputum Cardiovascular: negative for chest pain, dyspnea,  exertional chest pressure/discomfort, irregular heart beat, palpitations and syncope Gastrointestinal: negative for abdominal pain, change in bowel habits, nausea and vomiting Genitourinary: negative for abnormal menstrual periods, genital lesions, sexual problems and vaginal discharge, dysuria and urinary incontinence Integument/breast: negative for breast lump, breast tenderness and nipple discharge Hematologic/lymphatic: negative for bleeding and easy bruising Musculoskeletal:negative for back pain and muscle weakness Neurological: negative for dizziness, headaches, vertigo and weakness Endocrine: negative for diabetic symptoms including polydipsia, polyuria and skin dryness Allergic/Immunologic: negative for hay fever and urticaria        Objective:  Blood pressure 109/68, pulse 91, height 5\' 4"  (1.626 m), weight 190 lb 1.6 oz (86.2 kg), currently breastfeeding. Body mass index is 32.63 kg/m.   General Appearance:    Alert, cooperative, no distress, appears stated age, mild obesity  Head:    Normocephalic, without obvious abnormality, atraumatic  Eyes:    PERRL, conjunctiva/corneas clear, EOM's intact, both eyes  Ears:    Normal external ear canals, both ears  Nose:   Nares normal, septum midline, mucosa normal, no drainage or sinus tenderness  Throat:   Lips, mucosa, and tongue normal; teeth and gums normal  Neck:   Supple, symmetrical, trachea midline, no adenopathy; thyroid: no enlargement/tenderness/nodules; no carotid bruit or JVD  Back:     Symmetric, no curvature, ROM normal, no CVA tenderness  Lungs:     Clear to auscultation bilaterally, respirations unlabored  Chest Wall:    No tenderness or deformity   Heart:    Regular rate and rhythm, S1 and S2 normal, no murmur, rub or gallop  Breast Exam:    No tenderness, masses, or nipple abnormality  Abdomen:     Soft, non-tender, bowel sounds active all four quadrants, no masses, no organomegaly.    Genitalia:    Pelvic:external  genitalia normal, vagina without lesions, discharge, or tenderness, rectovaginal septum  normal. Cervix normal in appearance, no cervical motion tenderness.  IUD threads visible, ~ 3-4 cm in length. No adnexal masses or tenderness.  Uterus normal size, shape, mobile, regular contours, nontender.  Rectal:    Normal external sphincter.  No hemorrhoids appreciated. Internal exam not done.   Extremities:   Extremities  normal, atraumatic, no cyanosis or edema  Pulses:   2+ and symmetric all extremities  Skin:   Skin color, texture, turgor normal, no rashes or lesions  Lymph nodes:   Cervical, supraclavicular, and axillary nodes normal  Neurologic:   CNII-XII intact, normal strength, sensation and reflexes throughout   .  Labs:  Lab Results  Component Value Date   WBC 11.2 (H) 12/18/2016   HGB 12.8 12/18/2016   HCT 36.7 12/18/2016   MCV 92.8 12/18/2016   PLT 236 12/18/2016    Lab Results  Component Value Date   CREATININE 0.75 02/29/2016   BUN 10 02/29/2016   NA 138 02/29/2016   K 3.8 02/29/2016   CL 104 02/29/2016   CO2 26 02/29/2016    Lab Results  Component Value Date   ALT 14 02/29/2016   AST 17 02/29/2016   ALKPHOS 56 02/29/2016   BILITOT 0.3 02/29/2016    Lab Results  Component Value Date   TSH 0.677 06/09/2016     Assessment:   Healthy female exam.  Tobacco abuse  IUD in place Mild obesity  Plan:    - Blood tests: None performed today.  - Breast self exam technique reviewed and patient encouraged to perform self-exam monthly. - Contraception: Mirena IUD. Due for removal in 2023. - Discussed healthy lifestyle modifications. - Pap smear performed today.  - Flu vaccine up to date [facility]([employer]) - Tobacco abuse, patient plans to set quite date to July 11, 2016, along with her husband.  Currently smoking 4 cig/day. Will continue to follow status. Offered smoking cessation tools and advised on use of adjunct treatments such as patch, gum, medications,  etc if needed.  - RTC in 1 year for annual exam, or as needed.      Hildred Laserherry, [place] Sloop, MD [facility]

## 2017-06-16 ENCOUNTER — Encounter: Payer: 59 | Admitting: Internal Medicine

## 2017-06-20 LAB — IGP, COBASHPV16/18
HPV 16: NEGATIVE
HPV 18: NEGATIVE
HPV other hr types: NEGATIVE
PAP Smear Comment: 0

## 2017-10-02 ENCOUNTER — Ambulatory Visit (INDEPENDENT_AMBULATORY_CARE_PROVIDER_SITE_OTHER): Payer: No Typology Code available for payment source | Admitting: Internal Medicine

## 2017-10-02 ENCOUNTER — Encounter: Payer: Self-pay | Admitting: Internal Medicine

## 2017-10-02 VITALS — BP 108/70 | HR 79 | Temp 98.0°F | Ht 64.0 in | Wt 191.0 lb

## 2017-10-02 DIAGNOSIS — J029 Acute pharyngitis, unspecified: Secondary | ICD-10-CM | POA: Diagnosis not present

## 2017-10-02 DIAGNOSIS — J0100 Acute maxillary sinusitis, unspecified: Secondary | ICD-10-CM | POA: Diagnosis not present

## 2017-10-02 LAB — POCT RAPID STREP A (OFFICE): Rapid Strep A Screen: NEGATIVE

## 2017-10-02 MED ORDER — AZITHROMYCIN 250 MG PO TABS: ORAL_TABLET | ORAL | 0 refills | Status: DC

## 2017-10-02 NOTE — Progress Notes (Signed)
Pre visit review using our clinic review tool, if applicable. No additional management support is needed unless otherwise documented below in the visit note. 

## 2017-10-02 NOTE — Progress Notes (Signed)
Chief Complaint  Patient presents with  . Sore Throat   Sick visit c/o nasal congestion and pressure since Weds of last week and Friday sore throat today 3/10 sore throat. No fever she has had chills but no body aches. Her kids have been sick. She has tried salt water gargles Motrin does help also tried Sudafed. She has also had runny nose.    Sore Throat   Associated symptoms include congestion. Pertinent negatives include no coughing.   Review of Systems  Constitutional: Positive for chills. Negative for fever.  HENT: Positive for congestion, sinus pain and sore throat.   Eyes: Negative for blurred vision.  Respiratory: Negative for cough.   Cardiovascular: Negative for chest pain.  Musculoskeletal: Negative for myalgias.   Past Medical History:  Diagnosis Date  . Allergy   . Depression   . Tremors of nervous system    Essential Tremors (labetalol)   Past Surgical History:  Procedure Laterality Date  . TONSILLECTOMY  2010   Family History  Problem Relation Age of Onset  . Hyperlipidemia Mother   . Hypertension Mother   . Hyperthyroidism Mother   . Hyperlipidemia Father   . Cancer Maternal Grandmother 19       metastatic breast ca   . Heart attack Maternal Grandfather   . Heart disease Maternal Grandfather        AMI  . Cancer Paternal Grandmother        NOn-Hodgkins Lymphoma  . Diabetes Paternal Grandmother   . Heart disease Paternal Grandfather    Social History   Socioeconomic History  . Marital status: Married    Spouse name: Not on file  . Number of children: Not on file  . Years of education: Not on file  . Highest education level: Not on file  Occupational History  . Not on file  Social Needs  . Financial resource strain: Not on file  . Food insecurity:    Worry: Not on file    Inability: Not on file  . Transportation needs:    Medical: Not on file    Non-medical: Not on file  Tobacco Use  . Smoking status: Current Every Day Smoker     Packs/day: 0.25    Types: Cigarettes    Last attempt to quit: 09/23/2015    Years since quitting: 2.0  . Smokeless tobacco: Never Used  Substance and Sexual Activity  . Alcohol use: Yes    Comment: once a week 5oz  . Drug use: No  . Sexual activity: Yes    Birth control/protection: IUD  Lifestyle  . Physical activity:    Days per week: Not on file    Minutes per session: Not on file  . Stress: Not on file  Relationships  . Social connections:    Talks on phone: Not on file    Gets together: Not on file    Attends religious service: Not on file    Active member of club or organization: Not on file    Attends meetings of clubs or organizations: Not on file    Relationship status: Not on file  . Intimate partner violence:    Fear of current or ex partner: Not on file    Emotionally abused: Not on file    Physically abused: Not on file    Forced sexual activity: Not on file  Other Topics Concern  . Not on file  Social History Narrative   2 kids    Works [employer]  Current Meds  Medication Sig  . cetirizine (ZYRTEC) 10 MG tablet Take 10 mg by mouth daily.  . [DISCONTINUED] loratadine (CLARITIN) 10 MG tablet Take 10 mg by mouth daily.   No Known Allergies Recent Results (from the past 2160 hour(s))  POCT rapid strep A     Status: None   Collection Time: 10/02/17 10:18 AM  Result Value Ref Range   Rapid Strep A Screen Negative Negative   Objective  Body mass index is 32.79 kg/m. Wt Readings from Last 3 Encounters:  10/02/17 191 lb (86.6 kg)  06/14/17 190 lb 1.6 oz (86.2 kg)  03/01/17 187 lb 6 oz (85 kg)   Temp Readings from Last 3 Encounters:  10/02/17 98 F (36.7 C) (Oral)  12/19/16 98.5 F (36.9 C) (Oral)  10/23/16 98.1 F (36.7 C) (Oral)   BP Readings from Last 3 Encounters:  10/02/17 108/70  06/14/17 109/68  03/01/17 107/67   Pulse Readings from Last 3 Encounters:  10/02/17 79  06/14/17 91  03/01/17 88   O2 sat room air 98%   Physical Exam   Constitutional: She is oriented to person, place, and time and well-developed, well-nourished, and in no distress. Vital signs are normal.  HENT:  Head: Normocephalic and atraumatic.  Nose: Right sinus exhibits maxillary sinus tenderness. Left sinus exhibits maxillary sinus tenderness.  Mouth/Throat: Oropharynx is clear and moist and mucous membranes are normal.  B/l ethmoid ttp   Eyes: Pupils are equal, round, and reactive to light.  Cardiovascular: Normal rate, regular rhythm and normal heart sounds.  Pulmonary/Chest: Effort normal and breath sounds normal.  Neurological: She is alert and oriented to person, place, and time. Gait normal. Gait normal.  Skin: Skin is warm, dry and intact.  Psychiatric: Mood, memory, affect and judgment normal.  Nursing note and vitals reviewed.   Assessment   1. Sinusitis/sore throat strep test neg Plan  1. Zpack, cold handout, rec NS/Flonase, cepacol, warm salt gargles, tea with honey and lemon  Out for work x 2 days   Provider: Dr. French Anaracy McLean-Scoc[facility]

## 2017-10-02 NOTE — Patient Instructions (Signed)
Please call back if not better by the end of this week   Sore Throat A sore throat is pain, burning, irritation, or scratchiness in the throat. When you have a sore throat, you may feel pain or tenderness in your throat when you swallow or talk. Many things can cause a sore throat, including:  An infection.  Seasonal allergies.  Dryness in the air.  Irritants, such as smoke or pollution.  Gastroesophageal reflux disease (GERD).  A tumor.  A sore throat is often the first sign of another sickness. It may happen with other symptoms, such as coughing, sneezing, fever, and swollen neck glands. Most sore throats go away without medical treatment. Follow these instructions at home:  Take over-the-counter medicines only as told by your health care provider.  Drink enough fluids to keep your urine clear or pale yellow.  Rest as needed.  To help with pain, try: ? Sipping warm liquids, such as broth, herbal tea, or warm water. ? Eating or drinking cold or frozen liquids, such as frozen ice pops. ? Gargling with a salt-water mixture 3-4 times a day or as needed. To make a salt-water mixture, completely dissolve -1 tsp of salt in 1 cup of warm water. ? Sucking on hard candy or throat lozenges. ? Putting a cool-mist humidifier in your bedroom at night to moisten the air. ? Sitting in the bathroom with the door closed for 5-10 minutes while you run hot water in the shower.  Do not use any tobacco products, such as cigarettes, chewing tobacco, and e-cigarettes. If you need help quitting, ask your health care provider. Contact a health care provider if:  You have a fever for more than 2-3 days.  You have symptoms that last (are persistent) for more than 2-3 days.  Your throat does not get better within 7 days.  You have a fever and your symptoms suddenly get worse. Get help right away if:  You have difficulty breathing.  You cannot swallow fluids, soft foods, or your saliva.  You  have increased swelling in your throat or neck.  You have persistent nausea and vomiting. This information is not intended to replace advice given to you by your health care provider. Make sure you discuss any questions you have with your health care provider. Document Released: 08/04/2004 Document Revised: 02/21/2016 Document Reviewed: 04/17/2015 Elsevier Interactive Patient Education  2018 [employer].  Sinusitis, Adult Sinusitis is soreness and inflammation of your sinuses. Sinuses are hollow spaces in the bones around your face. Your sinuses are located:  Around your eyes.  In the middle of your forehead.  Behind your nose.  In your cheekbones.  Your sinuses and nasal passages are lined with a stringy fluid (mucus). Mucus normally drains out of your sinuses. When your nasal tissues become inflamed or swollen, the mucus can become trapped or blocked so air cannot flow through your sinuses. This allows bacteria, viruses, and funguses to grow, which leads to infection. Sinusitis can develop quickly and last for 7?10 days (acute) or for more than 12 weeks (chronic). Sinusitis often develops after a cold. What are the causes? This condition is caused by anything that creates swelling in the sinuses or stops mucus from draining, including:  Allergies.  Asthma.  Bacterial or viral infection.  Abnormally shaped bones between the nasal passages.  Nasal growths that contain mucus (nasal polyps).  Narrow sinus openings.  Pollutants, such as chemicals or irritants in the air.  A foreign object stuck in the  nose.  A fungal infection. This is rare.  What increases the risk? The following factors may make you more likely to develop this condition:  Having allergies or asthma.  Having had a recent cold or respiratory tract infection.  Having structural deformities or blockages in your nose or sinuses.  Having a weak immune system.  Doing a lot of swimming or  diving.  Overusing nasal sprays.  Smoking.  What are the signs or symptoms? The main symptoms of this condition are pain and a feeling of pressure around the affected sinuses. Other symptoms include:  Upper toothache.  Earache.  Headache.  Bad breath.  Decreased sense of smell and taste.  A cough that may get worse at night.  Fatigue.  Fever.  Thick drainage from your nose. The drainage is often green and it may contain pus (purulent).  Stuffy nose or congestion.  Postnasal drip. This is when extra mucus collects in the throat or back of the nose.  Swelling and warmth over the affected sinuses.  Sore throat.  Sensitivity to light.  How is this diagnosed? This condition is diagnosed based on symptoms, a medical history, and a physical exam. To find out if your condition is acute or chronic, your health care provider may:  Look in your nose for signs of nasal polyps.  Tap over the affected sinus to check for signs of infection.  View the inside of your sinuses using an imaging device that has a light attached (endoscope).  If your health care provider suspects that you have chronic sinusitis, you may also:  Be tested for allergies.  Have a sample of mucus taken from your nose (nasal culture) and checked for bacteria.  Have a mucus sample examined to see if your sinusitis is related to an allergy.  If your sinusitis does not respond to treatment and it lasts longer than 8 weeks, you may have an MRI or CT scan to check your sinuses. These scans also help to determine how severe your infection is. In rare cases, a bone biopsy may be done to rule out more serious types of fungal sinus disease. How is this treated? Treatment for sinusitis depends on the cause and whether your condition is chronic or acute. If a virus is causing your sinusitis, your symptoms will go away on their own within 10 days. You may be given medicines to relieve your symptoms,  including:  Topical nasal decongestants. They shrink swollen nasal passages and let mucus drain from your sinuses.  Antihistamines. These drugs block inflammation that is triggered by allergies. This can help to ease swelling in your nose and sinuses.  Topical nasal corticosteroids. These are nasal sprays that ease inflammation and swelling in your nose and sinuses.  Nasal saline washes. These rinses can help to get rid of thick mucus in your nose.  If your condition is caused by bacteria, you will be given an antibiotic medicine. If your condition is caused by a fungus, you will be given an antifungal medicine. Surgery may be needed to correct underlying conditions, such as narrow nasal passages. Surgery may also be needed to remove polyps. Follow these instructions at home: Medicines  Take, use, or apply over-the-counter and prescription medicines only as told by your health care provider. These may include nasal sprays.  If you were prescribed an antibiotic medicine, take it as told by your health care provider. Do not stop taking the antibiotic even if you start to feel better. Hydrate and Humidify  Drink enough water to keep your urine clear or pale yellow. Staying hydrated will help to thin your mucus.  Use a cool mist humidifier to keep the humidity level in your home above 50%.  Inhale steam for 10-15 minutes, 3-4 times a day or as told by your health care provider. You can do this in the bathroom while a hot shower is running.  Limit your exposure to cool or dry air. Rest  Rest as much as possible.  Sleep with your head raised (elevated).  Make sure to get enough sleep each night. General instructions  Apply a warm, moist washcloth to your face 3-4 times a day or as told by your health care provider. This will help with discomfort.  Wash your hands often with soap and water to reduce your exposure to viruses and other germs. If soap and water are not available, use hand  sanitizer.  Do not smoke. Avoid being around people who are smoking (secondhand smoke).  Keep all follow-up visits as told by your health care provider. This is important. Contact a health care provider if:  You have a fever.  Your symptoms get worse.  Your symptoms do not improve within 10 days. Get help right away if:  You have a severe headache.  You have persistent vomiting.  You have pain or swelling around your face or eyes.  You have vision problems.  You develop confusion.  Your neck is stiff.  You have trouble breathing. This information is not intended to replace advice given to you by your health care provider. Make sure you discuss any questions you have with your health care provider. Document Released: 06/27/2005 Document Revised: 02/21/2016 Document Reviewed: 04/22/2015 Elsevier Interactive Patient Education  [employer]2018 Elsevier Inc.

## 2017-11-10 ENCOUNTER — Encounter: Payer: 59 | Admitting: Internal Medicine

## 2017-11-14 ENCOUNTER — Encounter: Payer: Self-pay | Admitting: Internal Medicine

## 2017-11-14 ENCOUNTER — Other Ambulatory Visit: Payer: Self-pay | Admitting: Internal Medicine

## 2017-11-14 MED ORDER — AZELASTINE-FLUTICASONE 137-50 MCG/ACT NA SUSP: 1.0000 | Freq: Two times a day (BID) | NASAL | 5 refills | Status: DC

## 2017-11-16 MED ORDER — AZELASTINE HCL 0.1 % NA SOLN: 2.0000 | Freq: Two times a day (BID) | NASAL | 12 refills | Status: DC

## 2017-11-16 MED ORDER — FLUTICASONE PROPIONATE 50 MCG/ACT NA SUSP: 2.0000 | Freq: Every day | NASAL | 6 refills | Status: DC

## 2017-11-27 ENCOUNTER — Ambulatory Visit (INDEPENDENT_AMBULATORY_CARE_PROVIDER_SITE_OTHER): Payer: No Typology Code available for payment source | Admitting: Internal Medicine

## 2017-11-27 ENCOUNTER — Encounter: Payer: Self-pay | Admitting: Internal Medicine

## 2017-11-27 VITALS — BP 108/74 | HR 106 | Temp 98.2°F | Resp 15 | Ht 64.0 in | Wt 186.4 lb

## 2017-11-27 DIAGNOSIS — E782 Mixed hyperlipidemia: Secondary | ICD-10-CM | POA: Diagnosis not present

## 2017-11-27 DIAGNOSIS — L247 Irritant contact dermatitis due to plants, except food: Secondary | ICD-10-CM | POA: Diagnosis not present

## 2017-11-27 DIAGNOSIS — R5383 Other fatigue: Secondary | ICD-10-CM | POA: Diagnosis not present

## 2017-11-27 MED ORDER — TRIAMCINOLONE ACETONIDE 0.1 % EX CREA: 1.0000 "application " | TOPICAL_CREAM | Freq: Two times a day (BID) | CUTANEOUS | 2 refills | Status: DC

## 2017-11-27 NOTE — Patient Instructions (Signed)
Contact Dermatitis Dermatitis is redness, soreness, and swelling (inflammation) of the skin. Contact dermatitis is a reaction to certain substances that touch the skin. You either touched something that irritated your skin, or you have allergies to something you touched. Follow these instructions at home: Skin Care  Moisturize your skin as needed.  Apply cool compresses to the affected areas.  Try taking a bath with: ? Epsom salts. Follow the instructions on the package. You can get these at a pharmacy or grocery store. ? Baking soda. Pour a small amount into the bath as told by your doctor. ? Colloidal oatmeal. Follow the instructions on the package. You can get this at a pharmacy or grocery store.  Try applying baking soda paste to your skin. Stir water into baking soda until it looks like paste.  Do not scratch your skin.  Bathe less often.  Bathe in lukewarm water. Avoid using hot water. Medicines  Take or apply over-the-counter and prescription medicines only as told by your doctor.  If you were prescribed an antibiotic medicine, take or apply your antibiotic as told by your doctor. Do not stop taking the antibiotic even if your condition starts to get better. General instructions  Keep all follow-up visits as told by your doctor. This is important.  Avoid the substance that caused your reaction. If you do not know what caused it, keep a journal to try to track what caused it. Write down: ? What you eat. ? What cosmetic products you use. ? What you drink. ? What you wear in the affected area. This includes jewelry.  If you were given a bandage (dressing), take care of it as told by your doctor. This includes when to change and remove it. Contact a doctor if:  You do not get better with treatment.  Your condition gets worse.  You have signs of infection such as: ? Swelling. ? Tenderness. ? Redness. ? Soreness. ? Warmth.  You have a fever.  You have new  symptoms. Get help right away if:  You have a very bad headache.  You have neck pain.  Your neck is stiff.  You throw up (vomit).  You feel very sleepy.  You see red streaks coming from the affected area.  Your bone or joint underneath the affected area becomes painful after the skin has healed.  The affected area turns darker.  You have trouble breathing. This information is not intended to replace advice given to you by your health care provider. Make sure you discuss any questions you have with your health care provider. Document Released: 04/24/2009 Document Revised: 12/03/2015 Document Reviewed: 11/12/2014 Elsevier Interactive Patient Education  2018 Elsevier Inc.  

## 2017-11-27 NOTE — Progress Notes (Signed)
Subjective:  Patient ID: Brianna Washington, female    DOB: 1981-04-13  Age: 37 y.o. MRN: [id number]  CC: The primary encounter diagnosis was Fatigue, unspecified type. Diagnoses of Mixed hyperlipidemia and Contact dermatitis and eczema due to plant were also pertinent to this visit.  HPI Brianna Washington presents for treatment of pruritic rash on left side of neck.  First noticed 2-3 weeks ago, after getting a bad sunburn after an outing with family. Ha snot spread but shoulders both feeling "itchy" without any skin  changes.      Last seen by me in August 2017.  Spontaneous vaginal delivery  a health baby boy in June 2018   By Dr Logan Bores,  And has had Mirena IUD placed at follow up January 30 2017.  last gyn exam was Dec 2018   her today  rash started after playing in  [facility] with kids 3 weeks ago an dseimming in muddy water    Outpatient Medications Prior to Visit  Medication Sig Dispense Refill  . azelastine (ASTELIN) 0.1 % nasal spray Place 2 sprays into both nostrils 2 (two) times daily. Use in each nostril as directed 30 mL 12  . cetirizine (ZYRTEC) 10 MG tablet Take 10 mg by mouth daily.    . fluticasone (FLONASE) 50 MCG/ACT nasal spray Place 2 sprays into both nostrils daily. 16 g 6  . azithromycin (ZITHROMAX) 250 MG tablet 2 pills day 1 and 1 pill day 2-5 (Patient not taking: Reported on 11/27/2017) 6 tablet 0   No facility-administered medications prior to visit.     Review of Systems;  Patient denies headache, fevers, malaise, unintentional weight loss, skin rash, eye pain, sinus congestion and sinus pain, sore throat, dysphagia,  hemoptysis , cough, dyspnea, wheezing, chest pain, palpitations, orthopnea, edema, abdominal pain, nausea, melena, diarrhea, constipation, flank pain, dysuria, hematuria, urinary  Frequency, nocturia, numbness, tingling, seizures,  Focal weakness, Loss of consciousness,  Tremor, insomnia, depression, anxiety, and suicidal ideation.      Objective:    BP 108/74 (BP Location: Left Arm, Patient Position: Sitting, Cuff Size: Normal)   Pulse (!) 106   Temp 98.2 F (36.8 C) (Oral)   Resp 15   Ht  (1.626 m)   Wt 186 lb 6.4 oz (84.6 kg)   SpO2 98%   BMI 32.00 kg/m   BP Readings from Last 3 Encounters:  11/27/17 108/74  10/02/17 108/70  06/14/17 109/68    Wt Readings from Last 3 Encounters:  11/27/17 186 lb 6.4 oz (84.6 kg)  10/02/17 191 lb (86.6 kg)  06/14/17 190 lb 1.6 oz (86.2 kg)    General appearance: alert, cooperative and appears stated age Neck: no adenopathy, no carotid bruit, supple, symmetrical, trachea midline and thyroid not enlarged, symmetric, no tenderness/mass/nodules Back: symmetric, no curvature. ROM normal. No CVA tenderness. Lungs: clear to auscultation bilaterally Heart: regular rate and rhythm, S1, S2 normal, no murmur, click, rub or gallop Skin: papular patchy blanching rash left posterior neck.  No scaling , border indistinct  Lymph nodes: Cervical, supraclavicular, and axillary nodes normal.  Lab Results  Component Value Date   HGBA1C 4.9 05/09/2016    Lab Results  Component Value Date   CREATININE 0.75 02/29/2016   CREATININE 0.74 01/28/2015   CREATININE 0.9 07/26/2013    Lab Results  Component Value Date   WBC 11.2 (H) 12/18/2016   HGB 12.8 12/18/2016   HCT 36.7 12/18/2016   PLT 236 12/18/2016   GLUCOSE  88 02/29/2016   CHOL 201 (H) 07/26/2013   TRIG 121.0 07/26/2013   HDL 46.50 07/26/2013   LDLDIRECT 147.5 07/26/2013   LDLCALC 77 08/02/2012   ALT 14 02/29/2016   AST 17 02/29/2016   NA 138 02/29/2016   K 3.8 02/29/2016   CL 104 02/29/2016   CREATININE 0.75 02/29/2016   BUN 10 02/29/2016   CO2 26 02/29/2016   TSH 0.677 06/09/2016   HGBA1C 4.9 05/09/2016    No results found.  Assessment & Plan:   Problem List Items Addressed This Visit    Contact dermatitis and eczema due to plant    Offending agent unclear. Trial of Triamcinolone ointment bid.  Follow up one week.         Other Visit Diagnoses    Fatigue, unspecified type    -  Primary   Relevant Orders   Comprehensive metabolic panel   TSH   Mixed hyperlipidemia       Relevant Orders   Lipid panel      I have discontinued Shalandra E. Ting's azithromycin. I am also having her start on triamcinolone cream. Additionally, I am having her maintain her cetirizine, azelastine, and fluticasone.  Meds ordered this encounter  Medications  . triamcinolone cream (KENALOG) 0.1 %    Sig: Apply 1 application topically 2 (two) times daily. To rash    Dispense:  80 g    Refill:  2    Medications Discontinued During This Encounter  Medication Reason  . azithromycin (ZITHROMAX) 250 MG tablet Patient has not taken in last 30 days    Follow-up: No follow-ups on file.   Sherlene Shams, MD

## 2017-11-28 DIAGNOSIS — L247 Irritant contact dermatitis due to plants, except food: Secondary | ICD-10-CM | POA: Insufficient documentation

## 2017-11-28 NOTE — Assessment & Plan Note (Signed)
Offending agent unclear. Trial of Triamcinolone ointment bid.  Follow up one week.

## 2017-12-05 ENCOUNTER — Other Ambulatory Visit
Admission: RE | Admit: 2017-12-05 | Discharge: 2017-12-05 | Disposition: A | Discharge status: Home / Self Care | Payer: No Typology Code available for payment source | Source: Ambulatory Visit | Attending: Internal Medicine | Admitting: Internal Medicine

## 2017-12-05 DIAGNOSIS — E782 Mixed hyperlipidemia: Secondary | ICD-10-CM | POA: Diagnosis present

## 2017-12-05 DIAGNOSIS — R5383 Other fatigue: Secondary | ICD-10-CM | POA: Diagnosis not present

## 2017-12-05 LAB — LIPID PANEL
CHOLESTEROL: 135 mg/dL (ref 0–200)
HDL: 33 mg/dL — AB (ref >40)
LDL CALC: 86 mg/dL (ref 0–99)
TRIGLYCERIDES: 81 mg/dL (ref <150)
Total CHOL/HDL Ratio: 4.1 RATIO
VLDL: 16 mg/dL (ref 0–40)

## 2017-12-05 LAB — COMPREHENSIVE METABOLIC PANEL
ALK PHOS: 51 U/L (ref 38–126)
ALT: 18 U/L (ref 14–54)
ANION GAP: 6 (ref 5–15)
AST: 24 U/L (ref 15–41)
Albumin: 4.4 g/dL (ref 3.5–5.0)
BUN: 9 mg/dL (ref 6–20)
CALCIUM: 8.8 mg/dL — AB (ref 8.9–10.3)
CO2: 25 mmol/L (ref 22–32)
CREATININE: 0.78 mg/dL (ref 0.44–1.00)
Chloride: 105 mmol/L (ref 101–111)
Glucose, Bld: 91 mg/dL (ref 65–99)
Potassium: 3.2 mmol/L — ABNORMAL LOW (ref 3.5–5.1)
SODIUM: 136 mmol/L (ref 135–145)
Total Bilirubin: 0.8 mg/dL (ref 0.3–1.2)
Total Protein: 7.3 g/dL (ref 6.5–8.1)

## 2017-12-05 LAB — TSH: TSH: 1.864 u[IU]/mL (ref 0.350–4.500)

## 2017-12-06 ENCOUNTER — Encounter: Payer: Self-pay | Admitting: Internal Medicine

## 2017-12-06 ENCOUNTER — Ambulatory Visit (INDEPENDENT_AMBULATORY_CARE_PROVIDER_SITE_OTHER): Payer: No Typology Code available for payment source | Admitting: Internal Medicine

## 2017-12-06 VITALS — BP 106/72 | HR 94 | Temp 98.5°F | Resp 15 | Ht 64.0 in | Wt 186.2 lb

## 2017-12-06 DIAGNOSIS — Z720 Tobacco use: Secondary | ICD-10-CM

## 2017-12-06 DIAGNOSIS — L247 Irritant contact dermatitis due to plants, except food: Secondary | ICD-10-CM

## 2017-12-06 DIAGNOSIS — E669 Obesity, unspecified: Secondary | ICD-10-CM | POA: Diagnosis not present

## 2017-12-06 DIAGNOSIS — E876 Hypokalemia: Secondary | ICD-10-CM | POA: Diagnosis not present

## 2017-12-06 DIAGNOSIS — F17200 Nicotine dependence, unspecified, uncomplicated: Secondary | ICD-10-CM

## 2017-12-06 MED ORDER — NALTREXONE-BUPROPION HCL ER 8-90 MG PO TB12: ORAL_TABLET | ORAL | 0 refills | Status: DC

## 2017-12-06 NOTE — Progress Notes (Signed)
Subjective:  Patient ID: Brianna Washington, female    DOB: [birth year]  Age: 37 y.o. MRN: [id number]  CC: The primary encounter diagnosis was Hypokalemia. Diagnoses of Obesity (BMI 30.0-34.9), Mild tobacco abuse, and Contact dermatitis and eczema due to plant were also pertinent to this visit.  HPI wieg Brianna Washington presents for  evaluation and treatment of obesity.  Her well woman exam was done recently by her gynecologist .   Peak weight was 200 before delivery of healthy boy June 2018.  She has completed Phase 1 of the [employer] and without exercising has lost ten lbs .  discussed Contrave  Because she wants  to quit smoking as well.    2) Follow up on hives,  Wynetta Emery did not help .  Steroid taper  Not helping  Using stevia daily wonders if   Could be a food allergy averaging 2.5 packets daiy .     Outpatient Medications Prior to Visit  Medication Sig Dispense Refill  . azelastine (ASTELIN) 0.1 % nasal spray Place 2 sprays into both nostrils 2 (two) times daily. Use in each nostril as directed 30 mL 12  . cetirizine (ZYRTEC) 10 MG tablet Take 10 mg by mouth daily.    . fluticasone (FLONASE) 50 MCG/ACT nasal spray Place 2 sprays into both nostrils daily. 16 g 6  . hydrOXYzine (VISTARIL) 25 MG capsule     . predniSONE (STERAPRED UNI-PAK 21 TAB) 5 MG (21) TBPK tablet     . triamcinolone cream (KENALOG) 0.1 % Apply 1 application topically 2 (two) times daily. To rash (Patient not taking: Reported on 12/06/2017) 80 g 2   No facility-administered medications prior to visit.     Review of Systems;  Patient denies headache, fevers, malaise, unintentional weight loss, skin rash, eye pain, sinus congestion and sinus pain, sore throat, dysphagia,  hemoptysis , cough, dyspnea, wheezing, chest pain, palpitations, orthopnea, edema, abdominal pain, nausea, melena, diarrhea, constipation, flank pain, dysuria, hematuria, urinary  Frequency, nocturia, numbness, tingling, seizures,  Focal  weakness, Loss of consciousness,  Tremor, insomnia, depression, anxiety, and suicidal ideation.      Objective:  BP 106/72 (BP Location: Left Arm, Patient Position: Sitting, Cuff Size: Normal)   Pulse 94   Temp 98.5 F (36.9 C) (Oral)   Resp 15   Ht  (1.626 m)   Wt 186 lb 3.2 oz (84.5 kg)   SpO2 97%   BMI 31.96 kg/m   BP Readings from Last 3 Encounters:  12/06/17 106/72  11/27/17 108/74  10/02/17 108/70    Wt Readings from Last 3 Encounters:  12/06/17 186 lb 3.2 oz (84.5 kg)  11/27/17 186 lb 6.4 oz (84.6 kg)  10/02/17 191 lb (86.6 kg)    General appearance: alert, cooperative and appears stated age Ears: normal TM's and external ear canals both ears Throat: lips, mucosa, and tongue normal; teeth and gums normal Neck: no adenopathy, no carotid bruit, supple, symmetrical, trachea midline and thyroid not enlarged, symmetric, no tenderness/mass/nodules Back: symmetric, no curvature. ROM normal. No CVA tenderness. Lungs: clear to auscultation bilaterally Heart: regular rate and rhythm, S1, S2 normal, no murmur, click, rub or gallop Abdomen: soft, non-tender; bowel sounds normal; no masses,  no organomegaly Pulses: 2+ and symmetric Skin: Skin color, texture, turgor normal. No rashes or lesions Lymph nodes: Cervical, supraclavicular, and axillary nodes normal.  Lab Results  Component Value Date   HGBA1C 4.9 05/09/2016    Lab Results  Component Value Date  CREATININE 0.78 12/05/2017   CREATININE 0.75 02/29/2016   CREATININE 0.74 01/28/2015    Lab Results  Component Value Date   WBC 11.2 (H) 12/18/2016   HGB 12.8 12/18/2016   HCT 36.7 12/18/2016   PLT 236 12/18/2016   GLUCOSE 91 12/05/2017   CHOL 135 12/05/2017   TRIG 81 12/05/2017   HDL 33 (L) 12/05/2017   LDLDIRECT 147.5 07/26/2013   LDLCALC 86 12/05/2017   ALT 18 12/05/2017   AST 24 12/05/2017   NA 136 12/05/2017   K 3.2 (L) 12/05/2017   CL 105 12/05/2017   CREATININE 0.78 12/05/2017   BUN 9  12/05/2017   CO2 25 12/05/2017   TSH 1.864 12/05/2017   HGBA1C 4.9 05/09/2016    No results found.  Assessment & Plan:   Problem List Items Addressed This Visit    Obesity (BMI 30.0-34.9)    I have addressed  BMI and recommended a low glycemic index diet utilizing smaller more frequent meals to increase metabolism.  I have also recommended that patient start exercising with a goal of 30 minutes of aerobic exercise a minimum of 5 days per week. Screening for lipid disorders, thyroid and diabetes to be done today.  Trial of contrave discussed for the added potential benefit n helping her quit smoking.   Lab Results  Component Value Date   TSH 1.864 12/05/2017    Lab Results  Component Value Date   CHOL 135 12/05/2017   HDL 33 (L) 12/05/2017   LDLCALC 86 12/05/2017   LDLDIRECT 147.5 07/26/2013   TRIG 81 12/05/2017   CHOLHDL 4.1 12/05/2017            Mild tobacco abuse    Discussed trial of Contrave both for weight management and tobacco cessation       Contact dermatitis and eczema due to plant    Did not improve with steroid cream and once daily zyrtec, may be a food allergy.  Agree with stopping use of Stevia,  Ok to inrease zyrtec to twice daily        Other Visit Diagnoses    Hypokalemia    -  Primary   Relevant Orders   Magnesium   Basic metabolic panel      I have discontinued Imani E. Franey's triamcinolone cream. I am also having her start on Naltrexone-buPROPion HCl ER. Additionally, I am having her maintain her cetirizine, azelastine, fluticasone, hydrOXYzine, and predniSONE.  Meds ordered this encounter  Medications  . Naltrexone-buPROPion HCl ER 8-90 MG TB12    Sig: One tablet every morning for one week, then twice daily for one week.. Increase gradually to 2 tablets twice daily    Dispense:  120 tablet    Refill:  0    Medications Discontinued During This Encounter  Medication Reason  . triamcinolone cream (KENALOG) 0.1 % Patient has not  taken in last 30 days   A total of 25 minutes of face to face time was spent with patient more than half of which was spent in counselling about the above mentioned conditions  and coordination of care  Follow-up: Return in about 3 months (around 03/08/2018) for weight management .   Sherlene Shams, MD

## 2017-12-06 NOTE — Patient Instructions (Addendum)
For the hives:  Resume Zyrtec 10 mg every 6 hours and stop the Stevia .  If the itching resolves,  You can reduce the zyrtec to twice daily , and if still resolved you can resume using Stevia    Bupropion; Naltrexone extended-release tablets What is this medicine? BUPROPION; NALTREXONE (byoo PROE pee on; nal TREX one) is a combination product used to promote and maintain weight loss in obese adults or overweight adults who also have weight related medical problems. This medicine should be used with a reduced calorie diet and increased physical activity. This medicine may be used for other purposes; ask your health care provider or pharmacist if you have questions. COMMON BRAND NAME(S): CONTRAVE What should I tell my health care provider before I take this medicine? They need to know if you have any of these conditions: -an eating disorder, such as anorexia or bulimia -bipolar disorder -diabetes -depression -drug abuse or addiction -glaucoma -head injury -heart disease -high blood pressure -history of a tumor or infection of your brain or spine -history of stroke -history of irregular heartbeat -if you often drink alcohol -kidney disease -liver disease -schizophrenia -seizures -suicidal thoughts, plans, or attempt; a previous suicide attempt by you or a family member -an unusual or allergic reaction to bupropion, naltrexone, other medicines, foods, dyes, or preservatives -breast-feeding -pregnant or trying to become pregnant How should I use this medicine? Take this medicine by mouth with a glass of water. Follow the directions on the prescription label. Take this medicine in the morning and in the evenings as directed by your healthcare professional. Bonita Quin can take it with or without food. Do not take with high-fat meals as this may increase your risk of seizures. Do not crush, chew, or cut these tablets. Do not take your medicine more often than directed. Do not stop taking this  medicine suddenly except upon the advice of your doctor. A special MedGuide will be given to you by the pharmacist with each prescription and refill. Be sure to read this information carefully each time. Talk to your pediatrician regarding the use of this medicine in children. Special care may be needed. Overdosage: If you think you have taken too much of this medicine contact a poison control center or emergency room at once. NOTE: This medicine is only for you. Do not share this medicine with others. What if I miss a dose? If you miss a dose, skip the missed dose and take your next tablet at the regular time. Do not take double or extra doses. What may interact with this medicine? Do not take this medicine with any of the following medications: -any prescription or street opioid drug like codeine, heroin, methadone -linezolid -MAOIs like Carbex, Eldepryl, Marplan, Nardil, and Parnate -methylene blue (injected into a vein) -other medicines that contain bupropion like Zyban or Wellbutrin This medicine may also interact with the following medications: -alcohol -certain medicines for anxiety or sleep -certain medicines for blood pressure like metoprolol, propranolol -certain medicines for depression or psychotic disturbances -certain medicines for HIV or AIDS like efavirenz, lopinavir, nelfinavir, ritonavir -certain medicines for irregular heart beat like propafenone, flecainide -certain medicines for Parkinson's disease like amantadine, levodopa -certain medicines for seizures like carbamazepine, phenytoin, phenobarbital -cimetidine -clopidogrel -cyclophosphamide -digoxin -disulfiram -furazolidone -isoniazid -nicotine -orphenadrine -procarbazine -steroid medicines like prednisone or cortisone -stimulant medicines for attention disorders, weight loss, or to stay awake -tamoxifen -theophylline -thioridazine -thiotepa -ticlopidine -tramadol -warfarin This list may not describe  all possible interactions. Give your health  care provider a list of all the medicines, herbs, non-prescription drugs, or dietary supplements you use. Also tell them if you smoke, drink alcohol, or use illegal drugs. Some items may interact with your medicine. What should I watch for while using this medicine? This medicine is intended to be used in addition to a healthy diet and appropriate exercise. The best results are achieved this way. Do not increase or in any way change your dose without consulting your doctor or health care professional. Do not take this medicine with other prescription or over-the-counter weight loss products without consulting your doctor or health care professional. Your doctor should tell you to stop taking this medicine if you do not lose a certain amount of weight within the first 12 weeks of treatment. Visit your doctor or health care professional for regular checkups. Your doctor may order blood tests or other tests to see how you are doing. This medicine may affect blood sugar levels. If you have diabetes, check with your doctor or health care professional before you change your diet or the dose of your diabetic medicine. Patients and their families should watch out for new or worsening depression or thoughts of suicide. Also watch out for sudden changes in feelings such as feeling anxious, agitated, panicky, irritable, hostile, aggressive, impulsive, severely restless, overly excited and hyperactive, or not being able to sleep. If this happens, especially at the beginning of treatment or after a change in dose, call your health care professional. Avoid alcoholic drinks while taking this medicine. Drinking large amounts of alcoholic beverages, using sleeping or anxiety medicines, or quickly stopping the use of these agents while taking this medicine may increase your risk for a seizure. What side effects may I notice from receiving this medicine? Side effects that you should  report to your doctor or health care professional as soon as possible: -allergic reactions like skin rash, itching or hives, swelling of the face, lips, or tongue -breathing problems -changes in vision -confusion -elevated mood, decreased need for sleep, racing thoughts, impulsive behavior -fast or irregular heartbeat -hallucinations, loss of contact with reality -increased blood pressure -redness, blistering, peeling or loosening of the skin, including inside the mouth -seizures -signs and symptoms of liver injury like dark yellow or brown urine; general ill feeling or flu-like symptoms; light-colored stools; loss of appetite; nausea; right upper belly pain; unusually weak or tired; yellowing of the eyes or skin -suicidal thoughts or other mood changes -vomiting Side effects that usually do not require medical attention (report to your doctor or health care professional if they continue or are bothersome): -constipation -headache -loss of appetite -indigestion, stomach upset -tremors This list may not describe all possible side effects. Call your doctor for medical advice about side effects. You may report side effects to FDA at 1-800-FDA-1088. Where should I keep my medicine? Keep out of the reach of children. Store at room temperature between 15 and 30 degrees C (59 and 86 degrees F). Throw away any unused medicine after the expiration date. NOTE: This sheet is a summary. It may not cover all possible information. If you have questions about this medicine, talk to your doctor, pharmacist, or health care provider.  2018 Elsevier/Gold Standard (2015-12-18 13:42:58)

## 2017-12-08 NOTE — Assessment & Plan Note (Signed)
Did not improve with steroid cream and once daily zyrtec, may be a food allergy.  Agree with stopping use of Stevia,  Ok to inrease zyrtec to twice daily

## 2017-12-08 NOTE — Assessment & Plan Note (Signed)
I have addressed  BMI and recommended a low glycemic index diet utilizing smaller more frequent meals to increase metabolism.  I have also recommended that patient start exercising with a goal of 30 minutes of aerobic exercise a minimum of 5 days per week. Screening for lipid disorders, thyroid and diabetes to be done today.  Trial of contrave discussed for the added potential benefit n helping her quit smoking.   Lab Results  Component Value Date   TSH 1.864 12/05/2017    Lab Results  Component Value Date   CHOL 135 12/05/2017   HDL 33 (L) 12/05/2017   LDLCALC 86 12/05/2017   LDLDIRECT 147.5 07/26/2013   TRIG 81 12/05/2017   CHOLHDL 4.1 12/05/2017

## 2017-12-08 NOTE — Assessment & Plan Note (Signed)
Discussed trial of Contrave both for weight management and tobacco cessation

## 2017-12-11 ENCOUNTER — Encounter: Payer: Self-pay | Admitting: Internal Medicine

## 2017-12-11 ENCOUNTER — Other Ambulatory Visit: Payer: Self-pay | Admitting: Internal Medicine

## 2017-12-11 DIAGNOSIS — R21 Rash and other nonspecific skin eruption: Secondary | ICD-10-CM

## 2017-12-11 MED ORDER — HYDROXYZINE PAMOATE 25 MG PO CAPS: 25.0000 mg | ORAL_CAPSULE | Freq: Three times a day (TID) | ORAL | 2 refills | Status: DC | PRN

## 2017-12-19 ENCOUNTER — Other Ambulatory Visit
Admission: RE | Admit: 2017-12-19 | Discharge: 2017-12-19 | Disposition: A | Discharge status: Home / Self Care | Payer: No Typology Code available for payment source | Source: Ambulatory Visit | Attending: Internal Medicine | Admitting: Internal Medicine

## 2017-12-19 DIAGNOSIS — E876 Hypokalemia: Secondary | ICD-10-CM | POA: Diagnosis not present

## 2017-12-19 LAB — BASIC METABOLIC PANEL
ANION GAP: 7 (ref 5–15)
BUN: 12 mg/dL (ref 6–20)
CHLORIDE: 105 mmol/L (ref 101–111)
CO2: 25 mmol/L (ref 22–32)
Calcium: 8.9 mg/dL (ref 8.9–10.3)
Creatinine, Ser: 0.74 mg/dL (ref 0.44–1.00)
GFR calc Af Amer: >60 mL/min (ref >60)
GFR calc non Af Amer: >60 mL/min (ref >60)
GLUCOSE: 87 mg/dL (ref 65–99)
POTASSIUM: 3.7 mmol/L (ref 3.5–5.1)
Sodium: 137 mmol/L (ref 135–145)

## 2017-12-19 LAB — MAGNESIUM: MAGNESIUM: 1.8 mg/dL (ref 1.7–2.4)

## 2017-12-19 NOTE — Addendum Note (Signed)
Addended by: Donalyn Schneeberger on: 12/19/2017 10:43 AM   Modules accepted: Orders  

## 2017-12-19 NOTE — Addendum Note (Signed)
Addended by: Deloria LairAYLOR, Esai Stecklein on: 12/19/2017 10:43 AM   Modules accepted: Orders

## 2017-12-22 ENCOUNTER — Telehealth: Payer: Self-pay

## 2017-12-22 NOTE — Telephone Encounter (Signed)
PA for Contrave has been submitted on covermymeds.  

## 2017-12-28 DIAGNOSIS — E669 Obesity, unspecified: Secondary | ICD-10-CM

## 2017-12-28 NOTE — Telephone Encounter (Signed)
PA for Contrave has been approved from 12/25/2017 - 04/25/2018. Pt and pharmacy are aware.

## 2018-03-08 ENCOUNTER — Ambulatory Visit (INDEPENDENT_AMBULATORY_CARE_PROVIDER_SITE_OTHER): Payer: No Typology Code available for payment source | Admitting: Internal Medicine

## 2018-03-08 ENCOUNTER — Encounter: Payer: Self-pay | Admitting: Internal Medicine

## 2018-03-08 DIAGNOSIS — E669 Obesity, unspecified: Secondary | ICD-10-CM

## 2018-03-08 DIAGNOSIS — Z720 Tobacco use: Secondary | ICD-10-CM | POA: Diagnosis not present

## 2018-03-08 DIAGNOSIS — J301 Allergic rhinitis due to pollen: Secondary | ICD-10-CM | POA: Diagnosis not present

## 2018-03-08 DIAGNOSIS — F17200 Nicotine dependence, unspecified, uncomplicated: Secondary | ICD-10-CM

## 2018-03-08 NOTE — Patient Instructions (Signed)
Congratulations!     You are doing fantastic!  Stay with Weight Watchers  Here's a really good tasting bead that's Low carb   Sola bread  3 net carbs 60 cal /slice   Frozen bread section $5.19/loaf  At [employer]

## 2018-03-08 NOTE — Progress Notes (Signed)
Subjective:  Patient ID: Brianna Washington, female    DOB: 03-Jan-1981  Age: 37 y.o. MRN: [id number]  CC: Diagnoses of Obesity (BMI 30.0-34.9), Mild tobacco abuse, and Seasonal allergic rhinitis due to pollen were pertinent to this visit.  HPI Brianna Washington presents for  Management of obesity  She has 23 lbs since her last visit without medication.  Using weight watchers. Exercising on the average of twice weekly due to having to care for  two toddlers,  Ages 1 and 3.    Outpatient Medications Prior to Visit  Medication Sig Dispense Refill  . loratadine (CLARITIN) 10 MG tablet Take 10 mg by mouth daily.    . fluticasone (FLONASE) 50 MCG/ACT nasal spray Place 2 sprays into both nostrils daily. 16 g 6  . cetirizine (ZYRTEC) 10 MG tablet Take 10 mg by mouth daily.    . predniSONE (STERAPRED UNI-PAK 21 TAB) 5 MG (21) TBPK tablet     . azelastine (ASTELIN) 0.1 % nasal spray Place 2 sprays into both nostrils 2 (two) times daily. Use in each nostril as directed (Patient not taking: Reported on 03/08/2018) 30 mL 12  . hydrOXYzine (VISTARIL) 25 MG capsule Take 1 capsule (25 mg total) by mouth every 8 (eight) hours as needed. (Patient not taking: Reported on 03/08/2018) 90 capsule 2  . Naltrexone-buPROPion HCl ER 8-90 MG TB12 One tablet every morning for one week, then twice daily for one week.. Increase gradually to 2 tablets twice daily (Patient not taking: Reported on 03/08/2018) 120 tablet 0   No facility-administered medications prior to visit.     Review of Systems;  Patient denies headache, fevers, malaise, unintentional weight loss, skin rash, eye pain, sinus congestion and sinus pain, sore throat, dysphagia,  hemoptysis , cough, dyspnea, wheezing, chest pain, palpitations, orthopnea, edema, abdominal pain, nausea, melena, diarrhea, constipation, flank pain, dysuria, hematuria, urinary  Frequency, nocturia, numbness, tingling, seizures,  Focal weakness, Loss of consciousness,  Tremor,  insomnia, depression, anxiety, and suicidal ideation.      Objective:  BP 100/64 (BP Location: Left Arm, Patient Position: Sitting, Cuff Size: Normal)   Pulse 76   Temp 98.6 F (37 C) (Oral)   Resp 15   Wt 171 lb 4 oz (77.7 kg)   SpO2 99%   BMI 29.39 kg/m   BP Readings from Last 3 Encounters:  03/08/18 100/64  12/06/17 106/72  11/27/17 108/74    Wt Readings from Last 3 Encounters:  03/08/18 171 lb 4 oz (77.7 kg)  12/06/17 186 lb 3.2 oz (84.5 kg)  11/27/17 186 lb 6.4 oz (84.6 kg)    General appearance: alert, cooperative and appears stated age Ears: normal TM's and external ear canals both ears Throat: lips, mucosa, and tongue normal; teeth and gums normal Neck: no adenopathy, no carotid bruit, supple, symmetrical, trachea midline and thyroid not enlarged, symmetric, no tenderness/mass/nodules Back: symmetric, no curvature. ROM normal. No CVA tenderness. Lungs: clear to auscultation bilaterally Heart: regular rate and rhythm, S1, S2 normal, no murmur, click, rub or gallop Abdomen: soft, non-tender; bowel sounds normal; no masses,  no organomegaly Pulses: 2+ and symmetric Skin: Skin color, texture, turgor normal. No rashes or lesions Lymph nodes: Cervical, supraclavicular, and axillary nodes normal.  Lab Results  Component Value Date   HGBA1C 4.9 05/09/2016    Lab Results  Component Value Date   CREATININE 0.74 12/19/2017   CREATININE 0.78 12/05/2017   CREATININE 0.75 02/29/2016    Lab Results  Component Value Date  WBC 11.2 (H) 12/18/2016   HGB 12.8 12/18/2016   HCT 36.7 12/18/2016   PLT 236 12/18/2016   GLUCOSE 87 12/19/2017   CHOL 135 12/05/2017   TRIG 81 12/05/2017   HDL 33 (L) 12/05/2017   LDLDIRECT 147.5 07/26/2013   LDLCALC 86 12/05/2017   ALT 18 12/05/2017   AST 24 12/05/2017   NA 137 12/19/2017   K 3.7 12/19/2017   CL 105 12/19/2017   CREATININE 0.74 12/19/2017   BUN 12 12/19/2017   CO2 25 12/19/2017   TSH 1.864 12/05/2017   HGBA1C 4.9  05/09/2016    No results found.  Assessment & Plan:   Problem List Items Addressed This Visit    Allergic rhinitis    Managed  with zyrtec       Mild tobacco abuse    Discussed trial of Contrave at last visit both for weight management and tobacco cessation .  Medication was never started due to coverage issues. encouraged to quit smoking       Obesity (BMI 30.0-34.9)    I have congratulated her in reduction of   BMI and encouraged  Continued weight loss with goal of 10% of body weigh over the next 6 months using a low glycemic index diet and regular exercise a minimum of 5 days per week.          A total of 15 minutes of face to face time was spent with patient more than half of which was spent in counselling about the above mentioned conditions  and coordination of care .  I have discontinued Brianna Washington's azelastine, fluticasone, Naltrexone-buPROPion HCl ER, and hydrOXYzine. I am also having her maintain her cetirizine, predniSONE, and loratadine.  No orders of the defined types were placed in this encounter.   Medications Discontinued During This Encounter  Medication Reason  . azelastine (ASTELIN) 0.1 % nasal spray No longer needed (for PRN medications)  . hydrOXYzine (VISTARIL) 25 MG capsule No longer needed (for PRN medications)  . Naltrexone-buPROPion HCl ER 8-90 MG TB12 No longer needed (for PRN medications)  . fluticasone (FLONASE) 50 MCG/ACT nasal spray     Follow-up: Return as needed .   Sherlene Shamseresa L Tullo, MD

## 2018-03-10 ENCOUNTER — Encounter: Payer: Self-pay | Admitting: Internal Medicine

## 2018-03-10 NOTE — Assessment & Plan Note (Signed)
Discussed trial of Contrave at last visit both for weight management and tobacco cessation .  Medication was never started due to coverage issues. encouraged to quit smoking

## 2018-03-10 NOTE — Assessment & Plan Note (Signed)
I have congratulated her in reduction of   BMI and encouraged  Continued weight loss with goal of 10% of body weigh over the next 6 months using a low glycemic index diet and regular exercise a minimum of 5 days per week.    

## 2018-03-10 NOTE — Assessment & Plan Note (Signed)
Managed with zyrtec  

## 2018-05-30 ENCOUNTER — Ambulatory Visit (INDEPENDENT_AMBULATORY_CARE_PROVIDER_SITE_OTHER): Payer: Self-pay | Admitting: Physician Assistant

## 2018-05-30 ENCOUNTER — Encounter: Payer: Self-pay | Admitting: Physician Assistant

## 2018-05-30 VITALS — BP 110/70 | HR 84 | Temp 98.1°F | Wt 165.0 lb

## 2018-05-30 DIAGNOSIS — R05 Cough: Secondary | ICD-10-CM

## 2018-05-30 DIAGNOSIS — R059 Cough, unspecified: Secondary | ICD-10-CM

## 2018-05-30 DIAGNOSIS — J0110 Acute frontal sinusitis, unspecified: Secondary | ICD-10-CM

## 2018-05-30 MED ORDER — FLUCONAZOLE 150 MG PO TABS: 150.0000 mg | ORAL_TABLET | Freq: Every day | ORAL | 0 refills | Status: AC

## 2018-05-30 MED ORDER — AMOXICILLIN-POT CLAVULANATE 875-125 MG PO TABS: 1.0000 | ORAL_TABLET | Freq: Two times a day (BID) | ORAL | 0 refills | Status: AC

## 2018-05-30 MED ORDER — BENZONATATE 200 MG PO CAPS: 200.0000 mg | ORAL_CAPSULE | Freq: Two times a day (BID) | ORAL | 0 refills | Status: DC | PRN

## 2018-05-30 NOTE — Patient Instructions (Signed)
Thank you for choosing [facility] for your health care needs.  You have been diagnosed with sinusitis.  Take antibiotic, Augmentin, as prescribed. 1 pill by mouth twice a day x 7 days. Take with food to prevent stomach upset.  You have been prescribed Benzonatate, will help with cough.  You have also been prescribed Diflucan. Take if you develop vaginal yeast infection (vaginal itchiness and discharge); will sometimes occur due to the antibiotic.  Increase fluids. Rest. Continue over the counter Robitussin-DM for cough.  Return to clinic in 4-5 days if symptoms not improving.  Sinusitis, Adult Sinusitis is soreness and inflammation of your sinuses. Sinuses are hollow spaces in the bones around your face. They are located:  Around your eyes.  In the middle of your forehead.  Behind your nose.  In your cheekbones.  Your sinuses and nasal passages are lined with a stringy fluid (mucus). Mucus normally drains out of your sinuses. When your nasal tissues get inflamed or swollen, the mucus can get trapped or blocked so air cannot flow through your sinuses. This lets bacteria, viruses, and funguses grow, and that leads to infection. Follow these instructions at home: Medicines  Take, use, or apply over-the-counter and prescription medicines only as told by your doctor. These may include nasal sprays.  If you were prescribed an antibiotic medicine, take it as told by your doctor. Do not stop taking the antibiotic even if you start to feel better. Hydrate and Humidify  Drink enough water to keep your pee (urine) clear or pale yellow.  Use a cool mist humidifier to keep the humidity level in your home above 50%.  Breathe in steam for 10-15 minutes, 3-4 times a day or as told by your doctor. You can do this in the bathroom while a hot shower is running.  Try not to spend time in cool or dry air. Rest  Rest as much as possible.  Sleep with your head raised (elevated).  Make sure  to get enough sleep each night. General instructions  Put a warm, moist washcloth on your face 3-4 times a day or as told by your doctor. This will help with discomfort.  Wash your hands often with soap and water. If there is no soap and water, use hand sanitizer.  Do not smoke. Avoid being around people who are smoking (secondhand smoke).  Keep all follow-up visits as told by your doctor. This is important. Contact a doctor if:  You have a fever.  Your symptoms get worse.  Your symptoms do not get better within 10 days. Get help right away if:  You have a very bad headache.  You cannot stop throwing up (vomiting).  You have pain or swelling around your face or eyes.  You have trouble seeing.  You feel confused.  Your neck is stiff.  You have trouble breathing. This information is not intended to replace advice given to you by your health care provider. Make sure you discuss any questions you have with your health care provider. Document Released: 12/14/2007 Document Revised: 02/21/2016 Document Reviewed: 04/22/2015 Elsevier Interactive Patient Education  [employer]2018 [employer]

## 2018-05-30 NOTE — Progress Notes (Signed)
Patient ID: Brianna Washington DOB: [birth year] AGE: 37 y.o. MRN: [id number]   PCP: Sherlene Shamsullo, Teresa L, MD   Chief Complaint: Cough   Subjective:    HPI:  Brianna Washington is a 37 y.o. female presents for evaluation of cough.  37 year old female presents to [facility] with two week history of URI symptoms. Began with cough. Coarse. Productive. Reports thick yellow sputum. Over past week has developed frontal sinus pressure, nasal congestion, and postnasal drip. Few days ago developed sore throat. Yesterday developed fatigue and headache. Has been taking OTC TheraFlu and Robitussin-DM (helps with sleeping). Denies fever, chills, bodyaches, ear pain (does report ear fullness/pressure), maxillary sinus pain, chest pain, SOB, wheezing. Patient's youngest child sick with similar URI symptoms. Patient states in past when she's had similar symptoms, she's been treated with Z-pak, has worked.  Denies asthma history. Has never previously been prescribed albuterol inhaler. Patient with seasonal allergies. Does report recent worsening with current fall season. Takes Zyrtec daily.  A complete, at least 10 system review of symptoms was performed, pertinent positives and negatives as mentioned in HPI, otherwise negative.  The following portions of the patient's history were reviewed and updated as appropriate: allergies, current medications and past medical history.  Patient Active Problem List   Diagnosis Date Noted  . Gastroesophageal reflux in pregnancy 09/27/2016  . Mild tobacco abuse 06/09/2016  . Obesity (BMI 30.0-34.9) 08/02/2012  . Essential tremor 08/02/2012  . Allergic rhinitis 11/19/2008    No Known Allergies  Current Outpatient Medications on File Prior to Visit  Medication Sig Dispense Refill  . cetirizine (ZYRTEC) 10 MG tablet Take 10 mg by mouth daily.    Marland Kitchen. levonorgestrel (MIRENA) 20 MCG/24HR IUD 1 each by Intrauterine route once.    . loratadine (CLARITIN) 10 MG tablet  Take 10 mg by mouth daily.     No current facility-administered medications on file prior to visit.        Objective:   Vitals:   05/30/18 0934  BP: 110/70  Pulse: 84  Temp: 98.1 F (36.7 C)  SpO2: 96%     Wt Readings from Last 3 Encounters:  05/30/18 165 lb (74.8 kg)  03/08/18 171 lb 4 oz (77.7 kg)  12/06/17 186 lb 3.2 oz (84.5 kg)    Physical Exam:   General Appearance:  Alert, cooperative, appears stated age. In no acute distress. Afebrile.  Head:  Normocephalic, without obvious abnormality, atraumatic  Eyes:  PERRL, conjunctiva/corneas clear, EOM's intact, fundi benign, both eyes  Ears:  Ear canals WNL bilaterally. Bilateral TMs with faint erythema. No injection. No bulging.  Nose: Nares normal, septum midline. No discharge. Normal mucosa. Subjective bilateral frontal sinus pressure.  Throat: Lips, mucosa, and tongue normal; teeth and gums normal. Throat reveals no erythema. Mild coblestoning in posterior pharynx. Tonsils with no enlargement or exudate.  Neck: Supple, symmetrical, trachea midline, bilateral anterior cervical lymphadenopathy, tender to palpation;  thyroid: not enlarged, symmetric, no tenderness/mass/nodules; no carotid bruit or JVD  Back:   Symmetric, no curvature, ROM normal, no CVA tenderness  Lungs:   Clear to auscultation bilaterally, respirations unlabored. No wheezing, rales, rhonchi, crackles. No cough elicited with deep inspiration or forced expiration.  Heart:  Regular rate and rhythm, S1 and S2 normal, no murmur, rub, or gallop  Abdomen:   Soft, non-tender, bowel sounds active all four quadrants,  no masses, no organomegaly  Extremities: Extremities normal, atraumatic, no cyanosis or edema  Pulses: 2+ and symmetric  Skin: Skin  color, texture, turgor normal, no rashes or lesions  Lymph nodes: Cervical, supraclavicular, and axillary nodes normal  Neurologic: Normal    Assessment & Plan:    Exam findings, diagnosis etiology and medication use  and indications reviewed with patient. Follow-Up and discharge instructions provided. No emergent/urgent issues found on exam.  Patient education was provided.   Patient verbalized understanding of information provided and agrees with plan of care (POC), all questions answered. The patient is advised to call or return to clinic if condition does not see an improvement in symptoms, or to seek the care of the closest emergency department if condition worsens with the below plan.    1. Acute non-recurrent frontal sinusitis  - amoxicillin-clavulanate (AUGMENTIN) 875-125 MG tablet; Take 1 tablet by mouth 2 (two) times daily for 7 days.  Dispense: 14 tablet; Refill: 0 - fluconazole (DIFLUCAN) 150 MG tablet; Take 1 tablet (150 mg total) by mouth daily for 2 doses. 1 tablet on day 1, then repeat in 3 days if vaginal discharge/itchiness  Dispense: 2 tablet; Refill: 0  2. Cough  - benzonatate (TESSALON) 200 MG capsule; Take 1 capsule (200 mg total) by mouth 2 (two) times daily as needed for cough.  Dispense: 20 capsule; Refill: 0  Patient with two week history of URI symptoms. Coarse cough; associated frontal sinus pressure, nasal congestion, and PND. Will treat for sinusitis. Prescribed Augmentin. Advised continuation of Robitussin-DM. Patient given script for Diflucan in case of secondary vaginal yeast infection. Patient advised to f/u at clinic or at [facility] office in 4-5 days if not improving.  Janalyn Harder, MHS, PA-C Rulon Sera, MHS, PA-C Advanced Practice Provider [facility]  [address], [facility], [address] Floor [place], [place] [address] (p):  [phone] [email or web address]

## 2018-06-01 ENCOUNTER — Telehealth: Payer: Self-pay | Admitting: Emergency Medicine

## 2018-06-01 NOTE — Telephone Encounter (Signed)
Left message follow up call from [facility] visit 

## 2018-08-15 ENCOUNTER — Encounter: Payer: Self-pay | Admitting: Internal Medicine

## 2018-08-15 ENCOUNTER — Ambulatory Visit (INDEPENDENT_AMBULATORY_CARE_PROVIDER_SITE_OTHER): Payer: No Typology Code available for payment source

## 2018-08-15 ENCOUNTER — Ambulatory Visit (INDEPENDENT_AMBULATORY_CARE_PROVIDER_SITE_OTHER): Payer: No Typology Code available for payment source | Admitting: Internal Medicine

## 2018-08-15 VITALS — BP 106/64 | HR 76 | Temp 98.1°F | Resp 14 | Ht 64.0 in | Wt 167.4 lb

## 2018-08-15 DIAGNOSIS — R5383 Other fatigue: Secondary | ICD-10-CM | POA: Diagnosis not present

## 2018-08-15 DIAGNOSIS — M25542 Pain in joints of left hand: Secondary | ICD-10-CM

## 2018-08-15 DIAGNOSIS — Z0000 Encounter for general adult medical examination without abnormal findings: Secondary | ICD-10-CM

## 2018-08-15 DIAGNOSIS — Z716 Tobacco abuse counseling: Secondary | ICD-10-CM

## 2018-08-15 DIAGNOSIS — L603 Nail dystrophy: Secondary | ICD-10-CM

## 2018-08-15 DIAGNOSIS — E611 Iron deficiency: Secondary | ICD-10-CM

## 2018-08-15 DIAGNOSIS — E663 Overweight: Secondary | ICD-10-CM | POA: Diagnosis not present

## 2018-08-15 DIAGNOSIS — L03012 Cellulitis of left finger: Secondary | ICD-10-CM

## 2018-08-15 MED ORDER — BUPROPION HCL ER (XL) 150 MG PO TB24: 150.0000 mg | ORAL_TABLET | Freq: Every day | ORAL | 3 refills | Status: DC

## 2018-08-15 MED ORDER — CEFDINIR 300 MG PO CAPS: 300.0000 mg | ORAL_CAPSULE | Freq: Two times a day (BID) | ORAL | 0 refills | Status: DC

## 2018-08-15 NOTE — Patient Instructions (Signed)
Trial of  Omnicef for the nail issue.  And x ray today  If no resolution  Will make referral to dermatology  wellbutrin once daily for tobacco cessation   Health Maintenance, Female Adopting a healthy lifestyle and getting preventive care can go a long way to promote health and wellness. Talk with your health care provider about what schedule of regular examinations is right for you. This is a good chance for you to check in with your provider about disease prevention and staying healthy. In between checkups, there are plenty of things you can do on your own. Experts have done a lot of research about which lifestyle changes and preventive measures are most likely to keep you healthy. Ask your health care provider for more information. Weight and diet Eat a healthy diet  Be sure to include plenty of vegetables, fruits, low-fat dairy products, and lean protein.  Do not eat a lot of foods high in solid fats, added sugars, or salt.  Get regular exercise. This is one of the most important things you can do for your health. ? Most adults should exercise for at least 150 minutes each week. The exercise should increase your heart rate and make you sweat (moderate-intensity exercise). ? Most adults should also do strengthening exercises at least twice a week. This is in addition to the moderate-intensity exercise. Maintain a healthy weight  Body mass index (BMI) is a measurement that can be used to identify possible weight problems. It estimates body fat based on height and weight. Your health care provider can help determine your BMI and help you achieve or maintain a healthy weight.  For females 39 years of age and older: ? A BMI below 18.5 is considered underweight. ? A BMI of 18.5 to 24.9 is normal. ? A BMI of 25 to 29.9 is considered overweight. ? A BMI of 30 and above is considered obese. Watch levels of cholesterol and blood lipids  You should start having your blood tested for lipids and  cholesterol at 36 years of age, then have this test every 5 years.  You may need to have your cholesterol levels checked more often if: ? Your lipid or cholesterol levels are high. ? You are older than 36 years of age. ? You are at high risk for heart disease. Cancer screening Lung Cancer  Lung cancer screening is recommended for adults 72-93 years old who are at high risk for lung cancer because of a history of smoking.  A yearly low-dose CT scan of the lungs is recommended for people who: ? Currently smoke. ? Have quit within the past 15 years. ? Have at least a 30-pack-year history of smoking. A pack year is smoking an average of one pack of cigarettes a day for 1 year.  Yearly screening should continue until it has been 15 years since you quit.  Yearly screening should stop if you develop a health problem that would prevent you from having lung cancer treatment. Breast Cancer  Practice breast self-awareness. This means understanding how your breasts normally appear and feel.  It also means doing regular breast self-exams. Let your health care provider know about any changes, no matter how small.  If you are in your 20s or 30s, you should have a clinical breast exam (CBE) by a health care provider every 1-3 years as part of a regular health exam.  If you are 34 or older, have a CBE every year. Also consider having a breast X-ray (mammogram)  every year.  If you have a family history of breast cancer, talk to your health care provider about genetic screening.  If you are at high risk for breast cancer, talk to your health care provider about having an MRI and a mammogram every year.  Breast cancer gene (BRCA) assessment is recommended for women who have family members with BRCA-related cancers. BRCA-related cancers include: ? Breast. ? Ovarian. ? Tubal. ? Peritoneal cancers.  Results of the assessment will determine the need for genetic counseling and BRCA1 and BRCA2  testing. Cervical Cancer Your health care provider may recommend that you be screened regularly for cancer of the pelvic organs (ovaries, uterus, and vagina). This screening involves a pelvic examination, including checking for microscopic changes to the surface of your cervix (Pap test). You may be encouraged to have this screening done every 3 years, beginning at age 55.  For women ages 19-65, health care providers may recommend pelvic exams and Pap testing every 3 years, or they may recommend the Pap and pelvic exam, combined with testing for human papilloma virus (HPV), every 5 years. Some types of HPV increase your risk of cervical cancer. Testing for HPV may also be done on women of any age with unclear Pap test results.  Other health care providers may not recommend any screening for nonpregnant women who are considered low risk for pelvic cancer and who do not have symptoms. Ask your health care provider if a screening pelvic exam is right for you.  If you have had past treatment for cervical cancer or a condition that could lead to cancer, you need Pap tests and screening for cancer for at least 20 years after your treatment. If Pap tests have been discontinued, your risk factors (such as having a new sexual partner) need to be reassessed to determine if screening should resume. Some women have medical problems that increase the chance of getting cervical cancer. In these cases, your health care provider may recommend more frequent screening and Pap tests. Colorectal Cancer  This type of cancer can be detected and often prevented.  Routine colorectal cancer screening usually begins at 36 years of age and continues through 36 years of age.  Your health care provider may recommend screening at an earlier age if you have risk factors for colon cancer.  Your health care provider may also recommend using home test kits to check for hidden blood in the stool.  A small camera at the end of a  tube can be used to examine your colon directly (sigmoidoscopy or colonoscopy). This is done to check for the earliest forms of colorectal cancer.  Routine screening usually begins at age 65.  Direct examination of the colon should be repeated every 5-10 years through 36 years of age. However, you may need to be screened more often if early forms of precancerous polyps or small growths are found. Skin Cancer  Check your skin from head to toe regularly.  Tell your health care provider about any new moles or changes in moles, especially if there is a change in a mole's shape or color.  Also tell your health care provider if you have a mole that is larger than the size of a pencil eraser.  Always use sunscreen. Apply sunscreen liberally and repeatedly throughout the day.  Protect yourself by wearing long sleeves, pants, a wide-brimmed hat, and sunglasses whenever you are outside. Heart disease, diabetes, and high blood pressure  High blood pressure causes heart disease and increases  the risk of stroke. High blood pressure is more likely to develop in: ? People who have blood pressure in the high end of the normal range (130-139/85-89 mm Hg). ? People who are overweight or obese. ? People who are African American.  If you are 69-32 years of age, have your blood pressure checked every 3-5 years. If you are 11 years of age or older, have your blood pressure checked every year. You should have your blood pressure measured twice-once when you are at a hospital or clinic, and once when you are not at a hospital or clinic. Record the average of the two measurements. To check your blood pressure when you are not at a hospital or clinic, you can use: ? An automated blood pressure machine at a pharmacy. ? A home blood pressure monitor.  If you are between 108 years and 60 years old, ask your health care provider if you should take aspirin to prevent strokes.  Have regular diabetes screenings. This  involves taking a blood sample to check your fasting blood sugar level. ? If you are at a normal weight and have a low risk for diabetes, have this test once every three years after 36 years of age. ? If you are overweight and have a high risk for diabetes, consider being tested at a younger age or more often. Preventing infection Hepatitis B  If you have a higher risk for hepatitis B, you should be screened for this virus. You are considered at high risk for hepatitis B if: ? You were born in a country where hepatitis B is common. Ask your health care provider which countries are considered high risk. ? Your parents were born in a high-risk country, and you have not been immunized against hepatitis B (hepatitis B vaccine). ? You have HIV or AIDS. ? You use needles to inject street drugs. ? You live with someone who has hepatitis B. ? You have had sex with someone who has hepatitis B. ? You get hemodialysis treatment. ? You take certain medicines for conditions, including cancer, organ transplantation, and autoimmune conditions. Hepatitis C  Blood testing is recommended for: ? Everyone born from [birth year] through 1963. ? Anyone with known risk factors for hepatitis C. Sexually transmitted infections (STIs)  You should be screened for sexually transmitted infections (STIs) including gonorrhea and chlamydia if: ? You are sexually active and are younger than 37 years of age. ? You are older than 36 years of age and your health care provider tells you that you are at risk for this type of infection. ? Your sexual activity has changed since you were last screened and you are at an increased risk for chlamydia or gonorrhea. Ask your health care provider if you are at risk.  If you do not have HIV, but are at risk, it may be recommended that you take a prescription medicine daily to prevent HIV infection. This is called pre-exposure prophylaxis (PrEP). You are considered at risk if: ? You are  sexually active and do not regularly use condoms or know the HIV status of your partner(s). ? You take drugs by injection. ? You are sexually active with a partner who has HIV. Talk with your health care provider about whether you are at high risk of being infected with HIV. If you choose to begin PrEP, you should first be tested for HIV. You should then be tested every 3 months for as long as you are taking PrEP. Pregnancy  If  you are premenopausal and you may become pregnant, ask your health care provider about preconception counseling.  If you may become pregnant, take 400 to 800 micrograms (mcg) of folic acid every day.  If you want to prevent pregnancy, talk to your health care provider about birth control (contraception). Osteoporosis and menopause  Osteoporosis is a disease in which the bones lose minerals and strength with aging. This can result in serious bone fractures. Your risk for osteoporosis can be identified using a bone density scan.  If you are 51 years of age or older, or if you are at risk for osteoporosis and fractures, ask your health care provider if you should be screened.  Ask your health care provider whether you should take a calcium or vitamin D supplement to lower your risk for osteoporosis.  Menopause may have certain physical symptoms and risks.  Hormone replacement therapy may reduce some of these symptoms and risks. Talk to your health care provider about whether hormone replacement therapy is right for you. Follow these instructions at home:  Schedule regular health, dental, and eye exams.  Stay current with your immunizations.  Do not use any tobacco products including cigarettes, chewing tobacco, or electronic cigarettes.  If you are pregnant, do not drink alcohol.  If you are breastfeeding, limit how much and how often you drink alcohol.  Limit alcohol intake to no more than 1 drink per day for nonpregnant women. One drink equals 12 ounces of  beer, 5 ounces of wine, or 1 ounces of hard liquor.  Do not use street drugs.  Do not share needles.  Ask your health care provider for help if you need support or information about quitting drugs.  Tell your health care provider if you often feel depressed.  Tell your health care provider if you have ever been abused or do not feel safe at home. This information is not intended to replace advice given to you by your health care provider. Make sure you discuss any questions you have with your health care provider. Document Released: 01/10/2011 Document Revised: 12/03/2015 Document Reviewed: 03/31/2015 Elsevier Interactive Patient Education  2019 [employer].

## 2018-08-15 NOTE — Progress Notes (Signed)
Patient ID: Brianna Washington, female    DOB: Mar 21, 1981  Age: 38 y.o. MRN: [id number]  The patient is here for annual preventive  examination and management of other chronic and acute problems.   The risk factors are reflected in the social history.  The roster of all physicians providing medical care to patient - is listed in the Snapshot section of the chart.  Activities of daily living:  The patient is 100% independent in all ADLs: dressing, toileting, feeding as well as independent mobility  Home safety : The patient has smoke detectors in the home. They wear seatbelts.  There are no firearms at home. There is no violence in the home.   There is no risks for hepatitis, STDs or HIV. There is no   history of blood transfusion. They have no travel history to infectious disease endemic areas of the world.  The patient has seen their dentist in the last six month. They have seen their eye doctor in the last year. They admit to slight hearing difficulty with regard to whispered voices and some television programs.  They have deferred audiologic testing in the last year.  They do not  have excessive sun exposure. Discussed the need for sun protection: hats, long sleeves and use of sunscreen if there is significant sun exposure.   Diet: the importance of a healthy diet is discussed. They do have a healthy diet.  She has lost 25 lbs since her last visit on the Weight watchers program  The benefits of regular aerobic exercise were discussed. She walks 4 times per week ,  20 minutes.   Depression screen: there are no signs or vegative symptoms of depression- irritability, change in appetite, anhedonia, sadness/tearfullness.  The following portions of the patient's history were reviewed and updated as appropriate: allergies, current medications, past family history, past medical history,  past surgical history, past social history  and problem list.  Visual acuity was not assessed per patient  preference since she has regular follow up with her ophthalmologist. Hearing and body mass index were assessed and reviewed.   During the course of the visit the patient was educated and counseled about appropriate screening and preventive services including : fall prevention , diabetes screening, nutrition counseling, colorectal cancer screening, and recommended immunizations.    CC: The primary encounter diagnosis was Pain involving joint of finger of left hand. Diagnoses of Fatigue, unspecified type, Paronychia of finger, left, Overweight (BMI 25.0-29.9), Encounter for preventive health examination, Dietary iron deficiency, Dystrophic nail, and Tobacco abuse counseling were also pertinent to this visit.   1) fatigue: 2 toddlers,  Averages 6 hours of sleep . never wakes up feeling rested.   No daytime sleepiness .  Weight loss has ceased and she has plateaued.   Felt more energetic when she was actively losing weight,  Works full time.  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  2) dystrophic nail change to 5th finger lft hand for the past month.  No history of trauma.  Notes that the tip of the finger feels warm at times and the proximal end of the nail became red as will.  Cuticle did not become red.   3) tobacco abuse: Wants to quit smoking with wellbutrin     History Brianna Washington has a past medical history of Allergy, Depression, and Tremors of nervous system.   She has a past surgical history that includes  Tonsillectomy (2010).   Her family history includes Cancer in her paternal grandmother; Cancer (age of onset: 48) in her maternal grandmother; Diabetes in her paternal grandmother; Heart attack in her maternal grandfather; Heart disease in her maternal grandfather and paternal grandfather; Hyperlipidemia in her father and mother; Hypertension in her mother; Hyperthyroidism in her mother.She reports that she has been smoking cigarettes. She  has been smoking about 0.25 packs per day. She has never used smokeless tobacco. She reports current alcohol use. She reports that she does not use drugs.  Outpatient Medications Prior to Visit  Medication Sig Dispense Refill  . cetirizine (ZYRTEC) 10 MG tablet Take 10 mg by mouth daily.    Marland Kitchen levonorgestrel (MIRENA) 20 MCG/24HR IUD 1 each by Intrauterine route once.    . loratadine (CLARITIN) 10 MG tablet Take 10 mg by mouth daily.    . benzonatate (TESSALON) 200 MG capsule Take 1 capsule (200 mg total) by mouth 2 (two) times daily as needed for cough. (Patient not taking: Reported on 08/15/2018) 20 capsule 0   No facility-administered medications prior to visit.     Review of Systems  Patient denies headache, fevers, malaise, unintentional weight loss, skin rash, eye pain, sinus congestion and sinus pain, sore throat, dysphagia,  hemoptysis , cough, dyspnea, wheezing, chest pain, palpitations, orthopnea, edema, abdominal pain, nausea, melena, diarrhea, constipation, flank pain, dysuria, hematuria, urinary  Frequency, nocturia, numbness, tingling, seizures,  Focal weakness, Loss of consciousness,  Tremor, insomnia, depression, anxiety, and suicidal ideation.     Objective:  BP 106/64 (BP Location: Left Arm, Patient Position: Sitting, Cuff Size: Normal)   Pulse 76   Temp 98.1 F (36.7 C) (Oral)   Resp 14   Ht 5\' 4"  (1.626 m)   Wt 167 lb 6.4 oz (75.9 kg)   SpO2 98%   BMI 28.73 kg/m   Physical Exam   General appearance: alert, cooperative and appears stated age Head: Normocephalic, without obvious abnormality, atraumatic Eyes: conjunctivae/corneas clear. PERRL, EOM's intact. Fundi benign. Ears: normal TM's and external ear canals both ears Nose: Nares normal. Septum midline. Mucosa normal. No drainage or sinus tenderness. Throat: lips, mucosa, and tongue normal; teeth and gums normal Neck: no adenopathy, no carotid bruit, no JVD, supple, symmetrical, trachea midline and thyroid not  enlarged, symmetric, no tenderness/mass/nodules Lungs: clear to auscultation bilaterally Breasts: normal appearance, no masses or tenderness Heart: regular rate and rhythm, S1, S2 normal, no murmur, click, rub or gallop Abdomen: soft, non-tender; bowel sounds normal; no masses,  no organomegaly Extremities: extremities normal, atraumatic, no cyanosis or edema Pulses: 2+ and symmetric Skin: Skin color, texture, turgor normal. No rashes or lesions Neurologic: Alert and oriented X 3, normal strength and tone. Normal symmetric reflexes. Normal coordination and gait.     Assessment & Plan:   Problem List Items Addressed This Visit    Tobacco abuse counseling    wellbutrin trial initiated.       Overweight (BMI 25.0-29.9)    I have congratulated her in reduction of   BMI and encouraged  Continued weight loss with goal of 10% of body weight over the next 6 months by adding  regular exercise a minimum of 5 days per week.        Fatigue    Screening labs normal except for borderline low iron without anemia  No history of snoring.  Recommended participating in regular exercise program with goal of achieving a minimum of 30 minutes of aerobic activity 5 days per week.  Lab Results  Component Value Date   TSH 1.37 08/15/2018   Lab Results  Component Value Date   WBC 6.4 08/15/2018   HGB 13.9 08/15/2018   HCT 41.8 08/15/2018   MCV 92.5 08/15/2018   PLT 247.0 08/15/2018   Lab Results  Component Value Date   IRON 53 08/15/2018   TIBC 425 08/15/2018   FERRITIN 10 (L) 08/15/2018         Relevant Orders   CBC with Differential/Platelet (Completed)   TSH (Completed)   Iron, TIBC and Ferritin Panel (Completed)   Vitamin B12 (Completed)   Comprehensive metabolic panel (Completed)   Encounter for preventive health examination    age appropriate education and counseling updated, referrals for preventative services and immunizations addressed, dietary and smoking counseling addressed,  most recent labs reviewed.  I have personally reviewed and have noted:  1) the patient's medical and social history 2) The pt's use of alcohol, tobacco, and illicit drugs 3) The patient's current medications and supplements 4) Functional ability including ADL's, fall risk, home safety risk, hearing and visual impairment 5) Diet and physical activities 6) Evidence for depression or mood disorder 7) The patient's height, weight, and BMI have been recorded in the chart  I have made referrals, and provided counseling and education based on review of the above      Dystrophic nail    Will treat empirically for paronychia.  X ray of finger was negative for bone lesion and fracture.       Dietary iron deficiency    with normal hgb.  Supplement advised.        Other Visit Diagnoses    Pain involving joint of finger of left hand    -  Primary   Relevant Orders   DG Finger Little Left (Completed)   Paronychia of finger, left       Relevant Medications   cefdinir (OMNICEF) 300 MG capsule   Other Relevant Orders   Sedimentation rate (Completed)   C-reactive protein (Completed)      I have discontinued Brianna Washington's benzonatate. I am also having her start on buPROPion and cefdinir. Additionally, I am having her maintain her cetirizine, loratadine, and levonorgestrel.  Meds ordered this encounter  Medications  . buPROPion (WELLBUTRIN XL) 150 MG 24 hr tablet    Sig: Take 1 tablet (150 mg total) by mouth daily.    Dispense:  30 tablet    Refill:  3  . cefdinir (OMNICEF) 300 MG capsule    Sig: Take 1 capsule (300 mg total) by mouth 2 (two) times daily.    Dispense:  14 capsule    Refill:  0    Medications Discontinued During This Encounter  Medication Reason  . benzonatate (TESSALON) 200 MG capsule Patient has not taken in last 30 days    Follow-up: No follow-ups on file.   Sherlene Shams, MD

## 2018-08-16 LAB — COMPREHENSIVE METABOLIC PANEL
ALBUMIN: 4.6 g/dL (ref 3.5–5.2)
ALK PHOS: 53 U/L (ref 39–117)
ALT: 11 U/L (ref 0–35)
AST: 13 U/L (ref 0–37)
BILIRUBIN TOTAL: 0.7 mg/dL (ref 0.2–1.2)
BUN: 12 mg/dL (ref 6–23)
CHLORIDE: 105 meq/L (ref 96–112)
CO2: 29 mEq/L (ref 19–32)
CREATININE: 0.73 mg/dL (ref 0.40–1.20)
Calcium: 9 mg/dL (ref 8.4–10.5)
GFR: 90.19 mL/min (ref >60.00)
Glucose, Bld: 76 mg/dL (ref 70–99)
Potassium: 3.6 mEq/L (ref 3.5–5.1)
SODIUM: 139 meq/L (ref 135–145)
TOTAL PROTEIN: 7.3 g/dL (ref 6.0–8.3)

## 2018-08-16 LAB — IRON,TIBC AND FERRITIN PANEL
%SAT: 12 % (calc) — ABNORMAL LOW (ref 16–45)
Ferritin: 10 ng/mL — ABNORMAL LOW (ref 16–154)
Iron: 53 ug/dL (ref 40–190)
TIBC: 425 mcg/dL (calc) (ref 250–450)

## 2018-08-16 LAB — CBC WITH DIFFERENTIAL/PLATELET
Basophils Absolute: 0.1 10*3/uL (ref 0.0–0.1)
Basophils Relative: 1.1 % (ref 0.0–3.0)
EOS PCT: 2 % (ref 0.0–5.0)
Eosinophils Absolute: 0.1 10*3/uL (ref 0.0–0.7)
HCT: 41.8 % (ref 36.0–46.0)
Hemoglobin: 13.9 g/dL (ref 12.0–15.0)
LYMPHS ABS: 2.3 10*3/uL (ref 0.7–4.0)
Lymphocytes Relative: 35.3 % (ref 12.0–46.0)
MCHC: 33.2 g/dL (ref 30.0–36.0)
MCV: 92.5 fl (ref 78.0–100.0)
MONO ABS: 0.6 10*3/uL (ref 0.1–1.0)
MONOS PCT: 9.4 % (ref 3.0–12.0)
NEUTROS ABS: 3.3 10*3/uL (ref 1.4–7.7)
NEUTROS PCT: 52.2 % (ref 43.0–77.0)
PLATELETS: 247 10*3/uL (ref 150.0–400.0)
RBC: 4.52 Mil/uL (ref 3.87–5.11)
RDW: 14.2 % (ref 11.5–15.5)
WBC: 6.4 10*3/uL (ref 4.0–10.5)

## 2018-08-16 LAB — SEDIMENTATION RATE: Sed Rate: 3 mm/hr (ref 0–20)

## 2018-08-16 LAB — TSH: TSH: 1.37 u[IU]/mL (ref 0.35–4.50)

## 2018-08-16 LAB — VITAMIN B12: Vitamin B-12: 388 pg/mL (ref 211–911)

## 2018-08-16 LAB — C-REACTIVE PROTEIN: CRP: <1 mg/dL (ref 0.5–20.0)

## 2018-08-18 DIAGNOSIS — E611 Iron deficiency: Secondary | ICD-10-CM | POA: Insufficient documentation

## 2018-08-18 DIAGNOSIS — Z716 Tobacco abuse counseling: Secondary | ICD-10-CM | POA: Insufficient documentation

## 2018-08-18 DIAGNOSIS — L603 Nail dystrophy: Secondary | ICD-10-CM | POA: Insufficient documentation

## 2018-08-18 NOTE — Assessment & Plan Note (Signed)
wellbutrin trial initiated.

## 2018-08-18 NOTE — Assessment & Plan Note (Signed)

## 2018-08-18 NOTE — Assessment & Plan Note (Signed)
Screening labs normal except for borderline low iron without anemia  No history of snoring.  Recommended participating in regular exercise program with goal of achieving a minimum of 30 minutes of aerobic activity 5 days per week.   Lab Results  Component Value Date   TSH 1.37 08/15/2018   Lab Results  Component Value Date   WBC 6.4 08/15/2018   HGB 13.9 08/15/2018   HCT 41.8 08/15/2018   MCV 92.5 08/15/2018   PLT 247.0 08/15/2018   Lab Results  Component Value Date   IRON 53 08/15/2018   TIBC 425 08/15/2018   FERRITIN 10 (L) 08/15/2018

## 2018-08-18 NOTE — Assessment & Plan Note (Signed)
I have congratulated her in reduction of   BMI and encouraged  Continued weight loss with goal of 10% of body weight over the next 6 months by adding  regular exercise a minimum of 5 days per week.

## 2018-08-18 NOTE — Assessment & Plan Note (Signed)
with normal hgb.  Supplement advised.

## 2018-08-18 NOTE — Assessment & Plan Note (Signed)
Will treat empirically for paronychia.  X ray of finger was negative for bone lesion and fracture.

## 2018-09-11 NOTE — Telephone Encounter (Signed)
Sent to PCP to advise 

## 2018-09-19 ENCOUNTER — Ambulatory Visit (INDEPENDENT_AMBULATORY_CARE_PROVIDER_SITE_OTHER): Payer: No Typology Code available for payment source

## 2018-09-19 ENCOUNTER — Ambulatory Visit (INDEPENDENT_AMBULATORY_CARE_PROVIDER_SITE_OTHER): Payer: No Typology Code available for payment source | Admitting: Internal Medicine

## 2018-09-19 ENCOUNTER — Other Ambulatory Visit: Payer: Self-pay

## 2018-09-19 ENCOUNTER — Encounter: Payer: Self-pay | Admitting: Internal Medicine

## 2018-09-19 VITALS — BP 100/62 | HR 80 | Temp 98.1°F | Resp 15 | Ht 64.0 in | Wt 168.6 lb

## 2018-09-19 DIAGNOSIS — B9789 Other viral agents as the cause of diseases classified elsewhere: Secondary | ICD-10-CM

## 2018-09-19 DIAGNOSIS — M5441 Lumbago with sciatica, right side: Secondary | ICD-10-CM

## 2018-09-19 DIAGNOSIS — G8929 Other chronic pain: Secondary | ICD-10-CM | POA: Diagnosis not present

## 2018-09-19 DIAGNOSIS — M5440 Lumbago with sciatica, unspecified side: Secondary | ICD-10-CM | POA: Diagnosis not present

## 2018-09-19 DIAGNOSIS — J069 Acute upper respiratory infection, unspecified: Secondary | ICD-10-CM

## 2018-09-19 LAB — POCT URINE PREGNANCY: Preg Test, Ur: NEGATIVE

## 2018-09-19 MED ORDER — GUAIFENESIN-CODEINE 100-10 MG/5ML PO SYRP: 5.0000 mL | ORAL_SOLUTION | Freq: Three times a day (TID) | ORAL | 0 refills | Status: DC | PRN

## 2018-09-19 MED ORDER — CELECOXIB 200 MG PO CAPS: 200.0000 mg | ORAL_CAPSULE | Freq: Two times a day (BID) | ORAL | 1 refills | Status: DC

## 2018-09-19 NOTE — Patient Instructions (Signed)
cheratussin for cough at night (contains codeine)  Continue sudafed for congestion  Call for antibiotic if you develop facial or ear pain     x rays of lumbosacral spine today  I m prescribing celebrex 200 mg twice daily for flares..  Once daily otherwise. Do not combine with motrin  . You can add up to 2000 mg of acetominophen (tylenol) every day safely  In divided doses (500 mg every 6 hours  Or 1000 mg every 12 hours.)

## 2018-09-19 NOTE — Progress Notes (Signed)
Subjective:  Patient ID: Brianna Washington, female    DOB: March 19, 1981  Age: 38 y.o. MRN: [id number]  CC: The primary encounter diagnosis was Chronic low back pain with right-sided sciatica, unspecified back pain laterality. Diagnoses of Viral URI with cough and Chronic right-sided low back pain with sciatica, sciatica laterality unspecified were also pertinent to this visit.  HPI Brianna Washington presents for persistent low back pain  THAT RADIATES to her  right leg intermittently.  The pain has been present  for 2 years intermittently,  But has been persistent  24/7 and aggravated by her  work.  for the past 2 months    Using motrin 800 mg every 8 hours has not added tylenol.   2) Rhinitis and cough with headache x 4 days   Outpatient Medications Prior to Visit  Medication Sig Dispense Refill  . buPROPion (WELLBUTRIN XL) 150 MG 24 hr tablet Take 1 tablet (150 mg total) by mouth daily. 30 tablet 3  . cetirizine (ZYRTEC) 10 MG tablet Take 10 mg by mouth daily.    Marland Kitchen levonorgestrel (MIRENA) 20 MCG/24HR IUD 1 each by Intrauterine route once.    . loratadine (CLARITIN) 10 MG tablet Take 10 mg by mouth daily.    . cefdinir (OMNICEF) 300 MG capsule Take 1 capsule (300 mg total) by mouth 2 (two) times daily. (Patient not taking: Reported on 09/19/2018) 14 capsule 0   No facility-administered medications prior to visit.     Review of Systems;  Patient denies headache, fevers, malaise, unintentional weight loss, skin rash, eye pain, sinus congestion and sinus pain, sore throat, dysphagia,  hemoptysis , cough, dyspnea, wheezing, chest pain, palpitations, orthopnea, edema, abdominal pain, nausea, melena, diarrhea, constipation, flank pain, dysuria, hematuria, urinary  Frequency, nocturia, numbness, tingling, seizures,  Focal weakness, Loss of consciousness,  Tremor, insomnia, depression, anxiety, and suicidal ideation.      Objective:  BP 100/62 (BP Location: Left Arm, Patient Position: Sitting,  Cuff Size: Normal)   Pulse 80   Temp 98.1 F (36.7 C) (Oral)   Resp 15   Ht 5\' 4"  (1.626 m)   Wt 168 lb 9.6 oz (76.5 kg)   SpO2 98%   BMI 28.94 kg/m   BP Readings from Last 3 Encounters:  09/19/18 100/62  08/15/18 106/64  05/30/18 110/70    Wt Readings from Last 3 Encounters:  09/19/18 168 lb 9.6 oz (76.5 kg)  08/15/18 167 lb 6.4 oz (75.9 kg)  05/30/18 165 lb (74.8 kg)    General appearance: alert, cooperative and appears stated age Ears: normal TM's and external ear canals both ears Throat: lips, mucosa, and tongue normal; teeth and gums normal Neck: no adenopathy, no carotid bruit, supple, symmetrical, trachea midline and thyroid not enlarged, symmetric, no tenderness/mass/nodules Back: symmetric, no curvature. ROM normal. No CVA tenderness. Lungs: clear to auscultation bilaterally Heart: regular rate and rhythm, S1, S2 normal, no murmur, click, rub or gallop Abdomen: soft, non-tender; bowel sounds normal; no masses,  no organomegaly Pulses: 2+ and symmetric Skin: Skin color, texture, turgor normal. No rashes or lesions Lymph nodes: Cervical, supraclavicular, and axillary nodes normal.  Lab Results  Component Value Date   HGBA1C 4.9 05/09/2016    Lab Results  Component Value Date   CREATININE 0.73 08/15/2018   CREATININE 0.74 12/19/2017   CREATININE 0.78 12/05/2017    Lab Results  Component Value Date   WBC 6.4 08/15/2018   HGB 13.9 08/15/2018   HCT 41.8 08/15/2018  PLT 247.0 08/15/2018   GLUCOSE 76 08/15/2018   CHOL 135 12/05/2017   TRIG 81 12/05/2017   HDL 33 (L) 12/05/2017   LDLDIRECT 147.5 07/26/2013   LDLCALC 86 12/05/2017   ALT 11 08/15/2018   AST 13 08/15/2018   NA 139 08/15/2018   K 3.6 08/15/2018   CL 105 08/15/2018   CREATININE 0.73 08/15/2018   BUN 12 08/15/2018   CO2 29 08/15/2018   TSH 1.37 08/15/2018   HGBA1C 4.9 05/09/2016    No results found.  Assessment & Plan:   Problem List Items Addressed This Visit    Viral URI with  cough    Empiric supportive  Therapy .  Add abx if facial or ear pain develops      Chronic right-sided low back pain with sciatica    Plain films done today note mild T/al scoliosis with no significant degenerative change.  Trial of Celebrex and tylenol ,  Followed by orthopedics referral       Relevant Medications   celecoxib (CELEBREX) 200 MG capsule    Other Visit Diagnoses    Chronic low back pain with right-sided sciatica, unspecified back pain laterality    -  Primary   Relevant Medications   celecoxib (CELEBREX) 200 MG capsule   Other Relevant Orders   DG Lumbar Spine Complete (Completed)   POCT urine pregnancy (Completed)      I have discontinued Brianna Washington's cefdinir. I am also having her start on celecoxib and guaiFENesin-codeine. Additionally, I am having her maintain her cetirizine, loratadine, levonorgestrel, and buPROPion.  Meds ordered this encounter  Medications  . celecoxib (CELEBREX) 200 MG capsule    Sig: Take 1 capsule (200 mg total) by mouth 2 (two) times daily. For one week,  Then once daily thereafter    Dispense:  60 capsule    Refill:  1  . guaiFENesin-codeine (CHERATUSSIN AC) 100-10 MG/5ML syrup    Sig: Take 5 mLs by mouth 3 (three) times daily as needed for cough.    Dispense:  120 mL    Refill:  0  A total of 25 minutes of face to face time was spent with patient more than half of which was spent in counselling about the above mentioned conditions  and coordination of care  Medications Discontinued During This Encounter  Medication Reason  . cefdinir (OMNICEF) 300 MG capsule Completed Course    Follow-up: No follow-ups on file.   Sherlene Shams, MD

## 2018-09-20 DIAGNOSIS — B9789 Other viral agents as the cause of diseases classified elsewhere: Secondary | ICD-10-CM

## 2018-09-20 DIAGNOSIS — G8929 Other chronic pain: Secondary | ICD-10-CM | POA: Insufficient documentation

## 2018-09-20 DIAGNOSIS — M5440 Lumbago with sciatica, unspecified side: Secondary | ICD-10-CM

## 2018-09-20 DIAGNOSIS — J069 Acute upper respiratory infection, unspecified: Secondary | ICD-10-CM | POA: Insufficient documentation

## 2018-09-20 NOTE — Assessment & Plan Note (Signed)
Plain films done today note mild T/al scoliosis with no significant degenerative change.  Trial of Celebrex and tylenol ,  Followed by orthopedics referral

## 2018-09-20 NOTE — Assessment & Plan Note (Addendum)
Empiric supportive  Therapy .  Add abx if facial or ear pain develops

## 2018-09-22 ENCOUNTER — Other Ambulatory Visit: Payer: Self-pay | Admitting: Internal Medicine

## 2018-09-22 DIAGNOSIS — G8929 Other chronic pain: Secondary | ICD-10-CM

## 2018-09-22 DIAGNOSIS — M5441 Lumbago with sciatica, right side: Principal | ICD-10-CM

## 2018-10-22 ENCOUNTER — Other Ambulatory Visit: Payer: Self-pay | Admitting: Internal Medicine

## 2018-10-22 MED ORDER — CETIRIZINE HCL 10 MG PO TABS: 10.0000 mg | ORAL_TABLET | Freq: Every day | ORAL | 2 refills | Status: DC

## 2018-12-07 ENCOUNTER — Other Ambulatory Visit: Payer: Self-pay

## 2018-12-07 ENCOUNTER — Ambulatory Visit (INDEPENDENT_AMBULATORY_CARE_PROVIDER_SITE_OTHER): Payer: No Typology Code available for payment source | Admitting: Family Medicine

## 2018-12-07 ENCOUNTER — Encounter: Payer: Self-pay | Admitting: Family Medicine

## 2018-12-07 VITALS — BP 90/58 | HR 84 | Temp 98.4°F | Resp 16 | Ht 64.0 in | Wt 172.8 lb

## 2018-12-07 DIAGNOSIS — T162XXA Foreign body in left ear, initial encounter: Secondary | ICD-10-CM | POA: Diagnosis not present

## 2018-12-07 NOTE — Progress Notes (Signed)
Subjective:    Patient ID: Brianna Washington, female    DOB: 10-Aug-1980, 38 y.o.   MRN: [id number]  HPI   Patient presents to clinic due to believing she may have a Q-tip head stuck in her left ear.  Patient states she does sometimes clean her ears with Q-tips and when she pulled Q-tip out of her left ear, the tip of the Q-tip was not on the stick.  Denies any pain in ear.  Denies any hearing loss or hearing being muffled.  Patient Active Problem List   Diagnosis Date Noted  . Viral URI with cough 09/20/2018  . Chronic right-sided low back pain with sciatica 09/20/2018  . Dietary iron deficiency 08/18/2018  . Dystrophic nail 08/18/2018  . Tobacco abuse counseling 08/18/2018  . Gastroesophageal reflux in pregnancy 09/27/2016  . Encounter for preventive health examination 06/12/2016  . Mild tobacco abuse 06/09/2016  . Fatigue 03/01/2016  . Overweight (BMI 25.0-29.9) 08/02/2012  . Essential tremor 08/02/2012  . Allergic rhinitis 11/19/2008   Social History   Tobacco Use  . Smoking status: Current Every Day Smoker    Packs/day: 0.25    Types: Cigarettes    Last attempt to quit: 09/23/2015    Years since quitting: 3.2  . Smokeless tobacco: Never Used  Substance Use Topics  . Alcohol use: Yes    Comment: once a week 5oz    Review of Systems  Constitutional: Negative for chills, fatigue and fever.  HENT: Negative for congestion, sinus pain and sore throat. ?Q-tip stuck in left ear Eyes: Negative.   Respiratory: Negative for cough, shortness of breath and wheezing.   Cardiovascular: Negative for chest pain, palpitations and leg swelling.  Gastrointestinal: Negative for abdominal pain, diarrhea, nausea and vomiting.  Genitourinary: Negative for dysuria, frequency and urgency.  Musculoskeletal: Negative for arthralgias and myalgias.  Skin: Negative for color change, pallor and rash.  Neurological: Negative for syncope, light-headedness and headaches.  Psychiatric/Behavioral:  The patient is not nervous/anxious.    Objective:   Physical Exam Vitals signs and nursing note reviewed.  Constitutional:      General: She is not in acute distress. HENT:     Head: Normocephalic and atraumatic.     Right Ear: Tympanic membrane, ear canal and external ear normal.     Left Ear: Tympanic membrane, ear canal and external ear normal.     Ears:     Comments: No Q-tip head seen in either ear canal. Canals appear clear. TMs look normal.  Eyes:     General: No scleral icterus.    Extraocular Movements: Extraocular movements intact.     Conjunctiva/sclera: Conjunctivae normal.  Cardiovascular:     Rate and Rhythm: Normal rate and regular rhythm.  Pulmonary:     Effort: Pulmonary effort is normal.     Breath sounds: Normal breath sounds.  Skin:    General: Skin is warm and dry.     Coloration: Skin is not pale.  Neurological:     Mental Status: She is alert and oriented to person, place, and time.     Gait: Gait normal.  Psychiatric:        Mood and Affect: Mood normal.        Behavior: Behavior normal.    Vitals:   12/07/18 0906  BP: (!) 90/58  Pulse: 84  Resp: 16  Temp: 98.4 F (36.9 C)  SpO2: 97%      Assessment & Plan:   Foreign body of left  ear, initial encounter - Q tip cotton head came off after cleaning ear, no longer present on exam in office.   Q-tip head must have come out of patient's ear on its own.  Patient advised to not use Q-tips when cleaning ear as they can pack wax into canal and also the Q-tip head can easily come off and get stuck in ear canal.  Discussed using ear cleansing tools such as Debrox earwax solution/bulb syringe if needed and/or using a couple drops of mineral oil in ear 1-2 times a week to help keep wax soft.  Patient will keep regularly scheduled follow-up with PCP as planned and return to clinic sooner if any issues arise.

## 2018-12-14 MED ORDER — BUPROPION HCL ER (XL) 150 MG PO TB24: 150.0000 mg | ORAL_TABLET | Freq: Every day | ORAL | 3 refills | Status: DC

## 2019-01-30 ENCOUNTER — Telehealth (INDEPENDENT_AMBULATORY_CARE_PROVIDER_SITE_OTHER): Payer: No Typology Code available for payment source | Admitting: Internal Medicine

## 2019-01-30 ENCOUNTER — Other Ambulatory Visit: Payer: Self-pay

## 2019-01-30 ENCOUNTER — Encounter: Payer: Self-pay | Admitting: Internal Medicine

## 2019-01-30 DIAGNOSIS — E663 Overweight: Secondary | ICD-10-CM | POA: Diagnosis not present

## 2019-01-30 MED ORDER — PHENTERMINE HCL 37.5 MG PO TBDP: ORAL_TABLET | ORAL | 2 refills | Status: DC

## 2019-01-30 NOTE — Progress Notes (Signed)
Virtual Visit via [email or web address]  This visit type was conducted due to national recommendations for restrictions regarding the COVID-19 pandemic (e.g. social distancing).  This format is felt to be most appropriate for this patient at this time.  All issues noted in this document were discussed and addressed.  No physical exam was performed (except for noted visual exam findings with Video Visits).   I connected with@ on 01/30/19 at  1:30 PM EDT by a video enabled telemedicine application or telephone and verified that I am speaking with the correct person using two identifiers. Location patient: home Location provider: work or home office Persons participating in the virtual visit: patient, provider  I discussed the limitations, risks, security and privacy concerns of performing an evaluation and management service by telephone and the availability of in person appointments. I also discussed with the patient that there may be a patient responsible charge related to this service. The patient expressed understanding and agreed to proceed.  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  She reports that she has been earnestly trying to lose weight for the past year and had success with loss of 30 lbs last year using weight watchers program but "fell off the wagon in November and has gained 10 lbs.  has been unable to move the needle due to increased appetite.  She is not exercising regularly. She has a history of chronic low back pain with sciatica and works full times as an Charity fundraiserN and has two young children at home    ROS: See pertinent positives and negatives per HPI.  Past Medical History:  Diagnosis Date  . Allergy   . Depression   . Tremors of nervous system    Essential Tremors (labetalol)    Past Surgical History:  Procedure Laterality Date  . TONSILLECTOMY  2010    Family History   Problem Relation Age of Onset  . Hyperlipidemia Mother   . Hypertension Mother   . Hyperthyroidism Mother   . Hyperlipidemia Father   . Cancer Maternal Grandmother [phone]       metastatic breast ca   . Heart attack Maternal Grandfather   . Heart disease Maternal Grandfather        AMI  . Cancer Paternal Grandmother        NOn-Hodgkins Lymphoma  . Diabetes Paternal Grandmother   . Heart disease Paternal Grandfather     SOCIAL HX:  Social History   Social History Narrative   2 kids    Works Furniture conservator/restorerAC[employer]     Current Outpatient Medications:  .  buPROPion (WELLBUTRIN XL) 150 MG 24 hr tablet, Take 1 tablet (150 mg total) by mouth daily., Disp: 30 tablet, Rfl: 3 .  celecoxib (CELEBREX) 200 MG capsule, Take 1 capsule (200 mg total) by mouth 2 (two) times daily. For one week,  Then once daily thereafter, Disp: 60 capsule, Rfl: 1 .  cetirizine (ZYRTEC) 10 MG tablet, Take 1 tablet (10 mg total) by mouth daily., Disp: 90 tablet, Rfl: 2 .  levonorgestrel (MIRENA) 20 MCG/24HR IUD, 1 each by Intrauterine route once., Disp: , Rfl:  .  loratadine (CLARITIN) 10 MG tablet, Take 10 mg by mouth daily., Disp: , Rfl:  .  Phentermine HCl 37.5 MG TBDP, 1/2 tablet two times daily at 7  am and 2 pm, Disp: 30 tablet, Rfl: 2  EXAM:  VITALS per patient if applicable:  GENERAL: alert, oriented, appears well and in no acute distress  HEENT: atraumatic, conjunttiva clear, no obvious abnormalities on inspection of external nose and ears  NECK: normal movements of the head and neck  LUNGS: on inspection no signs of respiratory distress, breathing rate appears normal, no obvious gross SOB, gasping or wheezing  CV: no obvious cyanosis  MS: moves all visible extremities without noticeable abnormality  PSYCH/NEURO: pleasant and cooperative, no obvious depression or anxiety, speech and thought processing grossly intact  ASSESSMENT AND PLAN:  Overweight (BMI 25.0-29.9) She has had difficulty losing weight  due to increased appetite and is requesting a trial of  Phentermine.  She is aware of the possible side effects and risks and understands that    The medication will be discontinued if she has not lost 5% of her body weight over the next 3 months, which , based on today's weight is 8.5 lbs. She is also advised that without regular exercise she has a very low likelihood of maintaining a health weight.     I discussed the assessment and treatment plan with the patient. The patient was provided an opportunity to ask questions and all were answered. The patient agreed with the plan and demonstrated an understanding of the instructions.   The patient was advised to call back or seek an in-person evaluation if the symptoms worsen or if the condition fails to improve as anticipated.  I provided 15 minutes of non-face-to-face time during this encounter.   Crecencio Mc, MD

## 2019-02-01 NOTE — Assessment & Plan Note (Addendum)
She has had difficulty losing weight due to increased appetite and is requesting a trial of  Phentermine.  She is aware of the possible side effects and risks and understands that    The medication will be discontinued if she has not lost 5% of her body weight over the next 3 months, which , based on today's weight is 8.5 lbs. She is also advised that without regular exercise she has a very low likelihood of maintaining a health weight.

## 2019-03-19 ENCOUNTER — Telehealth: Payer: No Typology Code available for payment source | Admitting: Physician Assistant

## 2019-03-19 DIAGNOSIS — R399 Unspecified symptoms and signs involving the genitourinary system: Secondary | ICD-10-CM

## 2019-03-19 MED ORDER — CEPHALEXIN 500 MG PO CAPS: 500.0000 mg | ORAL_CAPSULE | Freq: Two times a day (BID) | ORAL | 0 refills | Status: AC

## 2019-03-19 MED ORDER — PHENAZOPYRIDINE HCL 97.2 MG PO TABS: 97.0000 mg | ORAL_TABLET | Freq: Three times a day (TID) | ORAL | 0 refills | Status: DC | PRN

## 2019-03-19 NOTE — Progress Notes (Signed)
We are sorry that you are not feeling well.  Here is how we plan to help!  Based on what you shared with me it looks like you most likely have a simple urinary tract infection.  A UTI (Urinary Tract Infection) is a bacterial infection of the bladder.  Most cases of urinary tract infections are simple to treat but a key part of your care is to encourage you to drink plenty of fluids and watch your symptoms carefully.  I have prescribed Keflex 500 mg twice a day for 7 days.  Your symptoms should gradually improve. You can also take AZO over-the-counter for treatment of pain with urination. This can cause your urine to change into a bright orange color, so be aware of this side effect. Call us if the burning in your urine worsens, you develop worsening fever, back pain or pelvic pain or if your symptoms do not resolve after completing the antibiotic.  Urinary tract infections can be prevented by drinking plenty of water to keep your body hydrated.  Also be sure when you wipe, wipe from front to back and don't hold it in!  If possible, empty your bladder every 4 hours.  Your e-visit answers were reviewed by a board certified advanced clinical practitioner to complete your personal care plan.  Depending on the condition, your plan could have included both over the counter or prescription medications.  If there is a problem please reply  once you have received a response from your provider.  Your safety is important to [place].  If you have drug allergies check your prescription carefully.    You can use MyChart to ask questions about today's visit, request a non-urgent call back, or ask for a work or school excuse for 24 hours related to this e-Visit. If it has been greater than 24 hours you will need to follow up with your provider, or enter a new e-Visit to address those concerns.   You will get an e-mail in the next two days asking about your experience.  I hope that your e-visit has been valuable and  will speed your recovery. Thank you for using e-visits.   Greater than 5 minutes, yet less than 10 minutes of time have been spent researching, coordinating, and implementing care for this patient today.

## 2019-04-22 ENCOUNTER — Other Ambulatory Visit: Payer: Self-pay | Admitting: Internal Medicine

## 2019-07-26 ENCOUNTER — Encounter: Payer: Self-pay | Admitting: Obstetrics and Gynecology

## 2019-10-30 ENCOUNTER — Encounter: Payer: Self-pay | Admitting: Obstetrics and Gynecology

## 2019-10-30 ENCOUNTER — Other Ambulatory Visit: Payer: Self-pay

## 2019-10-30 ENCOUNTER — Ambulatory Visit (INDEPENDENT_AMBULATORY_CARE_PROVIDER_SITE_OTHER): Payer: No Typology Code available for payment source | Admitting: Obstetrics and Gynecology

## 2019-10-30 VITALS — BP 100/65 | HR 81 | Ht 64.0 in | Wt 181.1 lb

## 2019-10-30 DIAGNOSIS — Z975 Presence of (intrauterine) contraceptive device: Secondary | ICD-10-CM

## 2019-10-30 DIAGNOSIS — Z01419 Encounter for gynecological examination (general) (routine) without abnormal findings: Secondary | ICD-10-CM

## 2019-10-30 DIAGNOSIS — J301 Allergic rhinitis due to pollen: Secondary | ICD-10-CM

## 2019-10-30 DIAGNOSIS — E669 Obesity, unspecified: Secondary | ICD-10-CM | POA: Diagnosis not present

## 2019-10-30 NOTE — Progress Notes (Signed)
GYNECOLOGY ANNUAL PHYSICAL EXAM PROGRESS NOTE  Subjective:    Brianna Washington is a 39 y.o. G60P2001 female who presents for an annual exam. The patient is sexually active. The patient wears seatbelts: yes. The patient participates in regular exercise: no.    The patient has no complaints today:  1. She reports issues with allergic rhinitis. Has been taking Claritin and Zyrtec for years but does not feel like it is working for her anymore.  She began taking Allegra which helps, but does not last the whole day.  Takes Benadryl at night, but notes it does not last throughout the night.   Gynecologic History                                                                Menarche age: 50 No LMP recorded (lmp unknown). (Menstrual status: IUD). Contraception: Mirena IUD (inserted 01/30/2017).  History of STI's: Denies Last Pap: 06/14/2017. Results were: normal.  Denies h/o abnormal pap smears    OB History  Gravida Para Term Preterm AB Living  2 2 2  0 0 1  SAB TAB Ectopic Multiple Live Births  0 0 0 0 1    # Outcome Date GA Lbr Len/2nd Weight Sex Delivery Anes PTL Lv  2 Term 12/18/16 [redacted]w[redacted]d / 01:31 7 lb 8.3 oz (3.41 kg) M Vag-Spont EPI       Name: Nebergall,BOY Shakala  1 Term 08/21/14 [redacted]w[redacted]d  6 lb 6 oz (2.892 kg) F Vag-Spont EPI N LIV     Apgar1: 8  Apgar5: 9    Past Medical History:  Diagnosis Date  . Allergy   . Depression   . Tremors of nervous system    Essential Tremors (labetalol)    Past Surgical History:  Procedure Laterality Date  . TONSILLECTOMY  2010    Family History  Problem Relation Age of Onset  . Hyperlipidemia Mother   . Hypertension Mother   . Hyperthyroidism Mother   . Hyperlipidemia Father   . Cancer Maternal Grandmother 32       metastatic breast ca   . Heart attack Maternal Grandfather   . Heart disease Maternal Grandfather        AMI  . Cancer Paternal Grandmother        NOn-Hodgkins Lymphoma  . Diabetes Paternal Grandmother   . Heart  disease Paternal Grandfather     Social History   Socioeconomic History  . Marital status: Married    Spouse name: Not on file  . Number of children: Not on file  . Years of education: Not on file  . Highest education level: Not on file  Occupational History  . Not on file  Tobacco Use  . Smoking status: Former Smoker    Packs/day: 0.25    Types: Cigarettes    Quit date: 09/23/2015    Years since quitting: 4.1  . Smokeless tobacco: Never Used  Substance and Sexual Activity  . Alcohol use: Yes    Comment: once a week 5oz  . Drug use: No  . Sexual activity: Yes    Birth control/protection: I.U.D.  Other Topics Concern  . Not on file  Social History Narrative   2 kids    Works [employer]   Social Determinants  of Health   Financial Resource Strain:   . Difficulty of Paying Living Expenses:   Food Insecurity:   . Worried About Programme researcher, broadcasting/film/video in the Last Year:   . Barista in the Last Year:   Transportation Needs:   . Freight forwarder (Medical):   Marland Kitchen Lack of Transportation (Non-Medical):   Physical Activity:   . Days of Exercise per Week:   . Minutes of Exercise per Session:   Stress:   . Feeling of Stress :   Social Connections:   . Frequency of Communication with Friends and Family:   . Frequency of Social Gatherings with Friends and Family:   . Attends Religious Services:   . Active Member of Clubs or Organizations:   . Attends Banker Meetings:   Marland Kitchen Marital Status:   Intimate Partner Violence:   . Fear of Current or Ex-Partner:   . Emotionally Abused:   Marland Kitchen Physically Abused:   . Sexually Abused:     Current Outpatient Medications on File Prior to Visit  Medication Sig Dispense Refill  . celecoxib (CELEBREX) 200 MG capsule Take 1 capsule (200 mg total) by mouth 2 (two) times daily. For one week,  Then once daily thereafter 60 capsule 1  . fexofenadine (ALLEGRA) 60 MG tablet Take 60 mg by mouth 2 (two) times daily.    Marland Kitchen  levonorgestrel (MIRENA) 20 MCG/24HR IUD 1 each by Intrauterine route once.     No current facility-administered medications on file prior to visit.    No Known Allergies    Review of Systems Constitutional: negative for chills, fatigue, fevers and sweats Eyes: negative for irritation, redness and visual disturbance Ears, nose, mouth, throat, and face: negative for hearing loss, nasal congestion, snoring and tinnitus Respiratory: negative for asthma, cough, sputum Cardiovascular: negative for chest pain, dyspnea, exertional chest pressure/discomfort, irregular heart beat, palpitations and syncope Gastrointestinal: negative for abdominal pain, change in bowel habits, nausea and vomiting Genitourinary: negative for abnormal menstrual periods, genital lesions, sexual problems and vaginal discharge, dysuria and urinary incontinence Integument/breast: negative for breast lump, breast tenderness and nipple discharge Hematologic/lymphatic: negative for bleeding and easy bruising Musculoskeletal:negative for back pain and muscle weakness Neurological: negative for dizziness, headaches, vertigo and weakness Endocrine: negative for diabetic symptoms including polydipsia, polyuria and skin dryness Allergic/Immunologic: negative for hay fever and urticaria. Positive for seasonal allergies.      Objective:  Blood pressure 100/65, pulse 81, height 5\' 4"  (1.626 m), weight 181 lb 1.6 oz (82.1 kg), currently breastfeeding. Body mass index is 31.09 kg/m.   General Appearance:    Alert, cooperative, no distress, appears stated age, mild obesity  Head:    Normocephalic, without obvious abnormality, atraumatic  Eyes:    PERRL, conjunctiva/corneas clear, EOM's intact, both eyes  Ears:    Normal external ear canals, both ears  Nose:   Nares normal, septum midline, mucosa normal, no drainage or sinus tenderness  Throat:   Lips, mucosa, and tongue normal; teeth and gums normal  Neck:   Supple,  symmetrical, trachea midline, no adenopathy; thyroid: no enlargement/tenderness/nodules; no carotid bruit or JVD  Back:     Symmetric, no curvature, ROM normal, no CVA tenderness  Lungs:     Clear to auscultation bilaterally, respirations unlabored  Chest Wall:    No tenderness or deformity   Heart:    Regular rate and rhythm, S1 and S2 normal, no murmur, rub or gallop  Breast Exam:  No tenderness, masses, or nipple abnormality  Abdomen:     Soft, non-tender, bowel sounds active all four quadrants, no masses, no organomegaly.    Genitalia:    Pelvic:external genitalia normal, vagina without lesions, discharge, or tenderness, rectovaginal septum  normal. Cervix normal in appearance, no cervical motion tenderness.  IUD threads visible, less than 0.5 cm in length. No adnexal masses or tenderness.  Uterus normal size, shape, mobile, regular contours, nontender.  Rectal:    Normal external sphincter.  No hemorrhoids appreciated. Internal exam not done.   Extremities:   Extremities normal, atraumatic, no cyanosis or edema  Pulses:   2+ and symmetric all extremities  Skin:   Skin color, texture, turgor normal, no rashes or lesions  Lymph nodes:   Cervical, supraclavicular, and axillary nodes normal  Neurologic:   CNII-XII intact, normal strength, sensation and reflexes throughout   .  Labs:  Lab Results  Component Value Date   WBC 6.4 08/15/2018   HGB 13.9 08/15/2018   HCT 41.8 08/15/2018   MCV 92.5 08/15/2018   PLT 247.0 08/15/2018    Lab Results  Component Value Date   CREATININE 0.73 08/15/2018   BUN 12 08/15/2018   NA 139 08/15/2018   K 3.6 08/15/2018   CL 105 08/15/2018   CO2 29 08/15/2018    Lab Results  Component Value Date   ALT 11 08/15/2018   AST 13 08/15/2018   ALKPHOS 53 08/15/2018   BILITOT 0.7 08/15/2018    Lab Results  Component Value Date   TSH 1.37 08/15/2018     Assessment:   Healthy female exam. IUD in place Mild obesity Seasonal  allergies  Plan:    - Blood tests: CBC, CMP, Lipid panel - Breast self exam technique reviewed and patient encouraged to perform self-exam monthly. - Contraception: Mirena IUD. Due for removal in 2023. IUD threads within cervical canal, brought down 1 cm.  - Discussed healthy lifestyle modifications. - Pap smear up to date.   - Advised that she should continue her Allegra, but could also resume the Claritin. Can take Benadryl as needed.  - RTC in 1 year for annual exam, or as needed.     Hildred Laser, MD [facility]

## 2019-10-30 NOTE — Patient Instructions (Signed)
Preventive Care 21-37 Years Old, Female Preventive care refers to visits with your health care provider and lifestyle choices that can promote health and wellness. This includes:  A yearly physical exam. This may also be called an annual well check.  Regular dental visits and eye exams.  Immunizations.  Screening for certain conditions.  Healthy lifestyle choices, such as eating a healthy diet, getting regular exercise, not using drugs or products that contain nicotine and tobacco, and limiting alcohol use. What can I expect for my preventive care visit? Physical exam Your health care provider will check your:  Height and weight. This may be used to calculate body mass index (BMI), which tells if you are at a healthy weight.  Heart rate and blood pressure.  Skin for abnormal spots. Counseling Your health care provider may ask you questions about your:  Alcohol, tobacco, and drug use.  Emotional well-being.  Home and relationship well-being.  Sexual activity.  Eating habits.  Work and work environment.  Method of birth control.  Menstrual cycle.  Pregnancy history. What immunizations do I need?  Influenza (flu) vaccine  This is recommended every year. Tetanus, diphtheria, and pertussis (Tdap) vaccine  You may need a Td booster every 10 years. Varicella (chickenpox) vaccine  You may need this if you have not been vaccinated. Human papillomavirus (HPV) vaccine  If recommended by your health care provider, you may need three doses over 6 months. Measles, mumps, and rubella (MMR) vaccine  You may need at least one dose of MMR. You may also need a second dose. Meningococcal conjugate (MenACWY) vaccine  One dose is recommended if you are age 19-21 years and a first-year college student living in a residence hall, or if you have one of several medical conditions. You may also need additional booster doses. Pneumococcal conjugate (PCV13) vaccine  You may need  this if you have certain conditions and were not previously vaccinated. Pneumococcal polysaccharide (PPSV23) vaccine  You may need one or two doses if you smoke cigarettes or if you have certain conditions. Hepatitis A vaccine  You may need this if you have certain conditions or if you travel or work in places where you may be exposed to hepatitis A. Hepatitis B vaccine  You may need this if you have certain conditions or if you travel or work in places where you may be exposed to hepatitis B. Haemophilus influenzae type b (Hib) vaccine  You may need this if you have certain conditions. You may receive vaccines as individual doses or as more than one vaccine together in one shot (combination vaccines). Talk with your health care provider about the risks and benefits of combination vaccines. What tests do I need?  Blood tests  Lipid and cholesterol levels. These may be checked every 5 years starting at age 20.  Hepatitis C test.  Hepatitis B test. Screening  Diabetes screening. This is done by checking your blood sugar (glucose) after you have not eaten for a while (fasting).  Sexually transmitted disease (STD) testing.  BRCA-related cancer screening. This may be done if you have a family history of breast, ovarian, tubal, or peritoneal cancers.  Pelvic exam and Pap test. This may be done every 3 years starting at age 21. Starting at age 30, this may be done every 5 years if you have a Pap test in combination with an HPV test. Talk with your health care provider about your test results, treatment options, and if necessary, the need for more tests.   Follow these instructions at home: Eating and drinking   Eat a diet that includes fresh fruits and vegetables, whole grains, lean protein, and low-fat dairy.  Take vitamin and mineral supplements as recommended by your health care provider.  Do not drink alcohol if: ? Your health care provider tells you not to drink. ? You are  pregnant, may be pregnant, or are planning to become pregnant.  If you drink alcohol: ? Limit how much you have to 0-1 drink a day. ? Be aware of how much alcohol is in your drink. In the [place] one drink equals one 12 oz bottle of beer (355 mL), one 5 oz glass of wine (148 mL), or one 1 oz glass of hard liquor (44 mL). Lifestyle  Take daily care of your teeth and gums.  Stay active. Exercise for at least 30 minutes on 5 or more days each week.  Do not use any products that contain nicotine or tobacco, such as cigarettes, e-cigarettes, and chewing tobacco. If you need help quitting, ask your health care provider.  If you are sexually active, practice safe sex. Use a condom or other form of birth control (contraception) in order to prevent pregnancy and STIs (sexually transmitted infections). If you plan to become pregnant, see your health care provider for a preconception visit. What's next?  Visit your health care provider once a year for a well check visit.  Ask your health care provider how often you should have your eyes and teeth checked.  Stay up to date on all vaccines. This information is not intended to replace advice given to you by your health care provider. Make sure you discuss any questions you have with your health care provider. Document Revised: 03/08/2018 Document Reviewed: 03/08/2018 Elsevier Patient Education  2020 Elsevier Inc. Breast Self-Awareness Breast self-awareness is knowing how your breasts look and feel. Doing breast self-awareness is important. It allows you to catch a breast problem early while it is still small and can be treated. All women should do breast self-awareness, including women who have had breast implants. Tell your doctor if you notice a change in your breasts. What you need:  A mirror.  A well-lit room. How to do a breast self-exam A breast self-exam is one way to learn what is normal for your breasts and to check for changes. To do a  breast self-exam: Look for changes  1. Take off all the clothes above your waist. 2. Stand in front of a mirror in a room with good lighting. 3. Put your hands on your hips. 4. Push your hands down. 5. Look at your breasts and nipples in the mirror to see if one breast or nipple looks different from the other. Check to see if: ? The shape of one breast is different. ? The size of one breast is different. ? There are wrinkles, dips, and bumps in one breast and not the other. 6. Look at each breast for changes in the skin, such as: ? Redness. ? Scaly areas. 7. Look for changes in your nipples, such as: ? Liquid around the nipples. ? Bleeding. ? Dimpling. ? Redness. ? A change in where the nipples are. Feel for changes  1. Lie on your back on the floor. 2. Feel each breast. To do this, follow these steps: ? Pick a breast to feel. ? Put the arm closest to that breast above your head. ? Use your other arm to feel the nipple area of your breast. Feel   the area with the pads of your three middle fingers by making small circles with your fingers. For the first circle, press lightly. For the second circle, press harder. For the third circle, press even harder. ? Keep making circles with your fingers at the different pressures as you move down your breast. Stop when you feel your ribs. ? Move your fingers a little toward the center of your body. ? Start making circles with your fingers again, this time going up until you reach your collarbone. ? Keep making up-and-down circles until you reach your armpit. Remember to keep using the three pressures. ? Feel the other breast in the same way. 3. Sit or stand in the tub or shower. 4. With soapy water on your skin, feel each breast the same way you did in step 2 when you were lying on the floor. Write down what you find Writing down what you find can help you remember what to tell your doctor. Write down:  What is normal for each breast.  Any  changes you find in each breast, including: ? The kind of changes you find. ? Whether you have pain. ? Size and location of any lumps.  When you last had your menstrual period. General tips  Check your breasts every month.  If you are breastfeeding, the best time to check your breasts is after you feed your baby or after you use a breast pump.  If you get menstrual periods, the best time to check your breasts is 5-7 days after your menstrual period is over.  With time, you will become comfortable with the self-exam, and you will begin to know if there are changes in your breasts. Contact a doctor if you:  See a change in the shape or size of your breasts or nipples.  See a change in the skin of your breast or nipples, such as red or scaly skin.  Have fluid coming from your nipples that is not normal.  Find a lump or thick area that was not there before.  Have pain in your breasts.  Have any concerns about your breast health. Summary  Breast self-awareness includes looking for changes in your breasts, as well as feeling for changes within your breasts.  Breast self-awareness should be done in front of a mirror in a well-lit room.  You should check your breasts every month. If you get menstrual periods, the best time to check your breasts is 5-7 days after your menstrual period is over.  Let your doctor know of any changes you see in your breasts, including changes in size, changes on the skin, pain or tenderness, or fluid from your nipples that is not normal. This information is not intended to replace advice given to you by your health care provider. Make sure you discuss any questions you have with your health care provider. Document Revised: 02/13/2018 Document Reviewed: 02/13/2018 Elsevier Patient Education  2020 Elsevier Inc.  

## 2019-10-30 NOTE — Progress Notes (Signed)
Pt present for annual exam. Pt stated having some cramping and bleeding with her IUD. Pt stated also having sinus symptoms. No gyn issues at this time.

## 2019-10-31 LAB — LIPID PANEL
Chol/HDL Ratio: 3.1 ratio (ref 0.0–4.4)
Cholesterol, Total: 147 mg/dL (ref 100–199)
HDL: 47 mg/dL (ref >39)
LDL Chol Calc (NIH): 86 mg/dL (ref 0–99)
Triglycerides: 70 mg/dL (ref 0–149)
VLDL Cholesterol Cal: 14 mg/dL (ref 5–40)

## 2019-10-31 LAB — CBC
Hematocrit: 36.9 % (ref 34.0–46.6)
Hemoglobin: 12.1 g/dL (ref 11.1–15.9)
MCH: 31.3 pg (ref 26.6–33.0)
MCHC: 32.8 g/dL (ref 31.5–35.7)
MCV: 96 fL (ref 79–97)
Platelets: 216 10*3/uL (ref 150–450)
RBC: 3.86 x10E6/uL (ref 3.77–5.28)
RDW: 12.8 % (ref 11.7–15.4)
WBC: 7 10*3/uL (ref 3.4–10.8)

## 2019-10-31 LAB — COMPREHENSIVE METABOLIC PANEL
ALT: 15 IU/L (ref 0–32)
AST: 13 IU/L (ref 0–40)
Albumin/Globulin Ratio: 1.8 (ref 1.2–2.2)
Albumin: 4.4 g/dL (ref 3.8–4.8)
Alkaline Phosphatase: 52 IU/L (ref 39–117)
BUN/Creatinine Ratio: 28 — ABNORMAL HIGH (ref 9–23)
BUN: 19 mg/dL (ref 6–20)
Bilirubin Total: 0.8 mg/dL (ref 0.0–1.2)
CO2: 25 mmol/L (ref 20–29)
Calcium: 9.3 mg/dL (ref 8.7–10.2)
Chloride: 103 mmol/L (ref 96–106)
Creatinine, Ser: 0.68 mg/dL (ref 0.57–1.00)
GFR calc Af Amer: 129 mL/min/{1.73_m2} (ref >59)
GFR calc non Af Amer: 112 mL/min/{1.73_m2} (ref >59)
Globulin, Total: 2.4 g/dL (ref 1.5–4.5)
Glucose: 85 mg/dL (ref 65–99)
Potassium: 4.2 mmol/L (ref 3.5–5.2)
Sodium: 140 mmol/L (ref 134–144)
Total Protein: 6.8 g/dL (ref 6.0–8.5)

## 2019-12-23 ENCOUNTER — Telehealth: Payer: Self-pay

## 2019-12-23 NOTE — Telephone Encounter (Signed)
Attempted to call patient and schedule appointment with PCP. Patient is at a doctors appointment and states they will call back to schedule.

## 2019-12-23 NOTE — Telephone Encounter (Signed)
Hulan Fray, CMA     12/23/19 2:52 PM Note Attempted to call patient and schedule appointment with PCP. Patient is at a doctors appointment and states they will call back to schedule.     Normajean Glasgow to Sherlene Shams, MD     12/23/19 2:17 PM Hey, I wanted to reach out to you as I have had some issues with my weight again. I stopped smoking in October and since then I have gained close to 15 lbs. I have tried weight watchers and am still having a hard time. I think it has a lot to do with hand to mouth, habit, or something like that. Let me know if we need to set up an appointment or something! Thank you, Anamarie

## 2019-12-31 ENCOUNTER — Encounter: Payer: Self-pay | Admitting: Internal Medicine

## 2019-12-31 ENCOUNTER — Telehealth (INDEPENDENT_AMBULATORY_CARE_PROVIDER_SITE_OTHER): Payer: No Typology Code available for payment source | Admitting: Internal Medicine

## 2019-12-31 DIAGNOSIS — E669 Obesity, unspecified: Secondary | ICD-10-CM

## 2019-12-31 DIAGNOSIS — Z87891 Personal history of nicotine dependence: Secondary | ICD-10-CM

## 2019-12-31 MED ORDER — SAXENDA 18 MG/3ML ~~LOC~~ SOPN
0.6000 mg | PEN_INJECTOR | Freq: Every day | SUBCUTANEOUS | 0 refills | Status: DC
Start: 2019-12-31 — End: 2020-02-11

## 2019-12-31 MED ORDER — PEN NEEDLES 31G X 6 MM MISC
0 refills | Status: DC
Start: 2019-12-31 — End: 2020-05-12

## 2019-12-31 NOTE — Patient Instructions (Signed)
The phentermine medication is not covered at the lower dose ,  And Belviq is not covered either , So I am recommending a trial  of the injectable medication called Saxenda to help you lose weight.  It is similar to a a medicine that is used to treat diabetes called Victoza,  So It may lower your blood sugars .   It is injected daily  And the dose is increased weekly  (if tolerated,  Nausea usually resolves in a few days)  0.6 mg daily for   Week 1 1.2 mg daily  For Week 2 1.8 mg  Brianna Washington for Week 3 2.4 mg daily for Week 4 3.0 mg daily for Week 5 and ongoing   It may require prior authorization

## 2019-12-31 NOTE — Progress Notes (Signed)
Virtual Visit via Caregility  This visit type was conducted due to national recommendations for restrictions regarding the COVID-19 pandemic (e.g. social distancing).  This format is felt to be most appropriate for this patient at this time.  All issues noted in this document were discussed and addressed.  No physical exam was performed (except for noted visual exam findings with Video Visits).   I connected with@ on 12/31/19 at  4:30 PM EDT by a video enabled telemedicine application  and verified that I am speaking with the correct person using two identifiers. Location patient: home Location provider: work or home office Persons participating in the virtual visit: patient, provider  I discussed the limitations, risks, security and privacy concerns of performing an evaluation and management service by telephone and the availability of in person appointments. I also discussed with the patient that there may be a patient responsible charge related to this service. The patient expressed understanding and agreed to proceed.  Reason for visit: difficulty losing weight  HPI:  15 lb weight gain since quitting smoking.  179 lbs. Appetite uncontrolled.   Not exercising . 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  Trouble concentrating.  Weight Watchers worked in the past , but since she quit smoking, unable to control appetite.   Did not tolerate full dose phentermine in past trial in  July 2020 due to tachycardia ,  But lost 17 lbs on it.  Willing to try an alternative appetite suppressant   ROS: See pertinent positives and negatives per HPI.  Past Medical History:  Diagnosis Date  . Allergy   . Depression   . Tremors of nervous system    Essential Tremors (labetalol)    Past Surgical History:  Procedure Laterality Date  . TONSILLECTOMY  2010    Family History  Problem Relation Age of Onset  . Hyperlipidemia Mother   . Hypertension Mother   .  Hyperthyroidism Mother   . Hyperlipidemia Father   . Cancer Maternal Grandmother 58       metastatic breast ca   . Heart attack Maternal Grandfather   . Heart disease Maternal Grandfather        AMI  . Cancer Paternal Grandmother        NOn-Hodgkins Lymphoma  . Diabetes Paternal Grandmother   . Heart disease Paternal Grandfather     SOCIAL HX:  reports that she quit smoking about 4 years ago. Her smoking use included cigarettes. She smoked 0.25 packs per day. She has never used smokeless tobacco. She reports current alcohol use. She reports that she does not use drugs.   Current Outpatient Medications:  .  fexofenadine (ALLEGRA) 60 MG tablet, Take 60 mg by mouth 2 (two) times daily., Disp: , Rfl:  .  Insulin Pen Needle (PEN NEEDLES) 31G X 6 MM MISC, For use with victoza /saxenda, Disp: 100 each, Rfl: 0 .  levonorgestrel (MIRENA) 20 MCG/24HR IUD, 1 each by Intrauterine route once., Disp: , Rfl:  .  Liraglutide -Weight Management (SAXENDA) 18 MG/3ML SOPN, Inject 0.1 mLs (0.6 mg total) into the skin daily. Increase dose weekly as follows: Week 2: 1.2 mg daily ; Week 3: 1.8 mg daily; Week 4: 2.4 mg daily, Disp: 9 mL, Rfl: 0  EXAM:  VITALS per patient if applicable:  GENERAL: alert, oriented, appears well and in no acute distress  HEENT: atraumatic, conjunttiva clear, no obvious abnormalities on inspection of external nose and ears  NECK: normal movements of the head and neck  LUNGS: on inspection no signs of respiratory distress, breathing rate appears normal, no obvious gross SOB, gasping or wheezing  CV: no obvious cyanosis  MS: moves all visible extremities without noticeable abnormality  PSYCH/NEURO: pleasant and cooperative, no obvious depression or anxiety, speech and thought processing grossly intact  ASSESSMENT AND PLAN:  Discussed the following assessment and plan:  History of tobacco abuse  Obesity, Class I, BMI 30.0-34.9 (see actual BMI)  History of tobacco  abuse She has quit smoking  And notes increased appetite   Obesity, Class I, BMI 30.0-34.9 (see actual BMI) Precipitated by tobacco cessation.  Appetite uncontrolled.  Did not tolerate phentermine in the past due to tachycardia.  Belviq and Qsymia not covered.  Trial of Saxenda     I discussed the assessment and treatment plan with the patient. The patient was provided an opportunity to ask questions and all were answered. The patient agreed with the plan and demonstrated an understanding of the instructions.   The patient was advised to call back or seek an in-person evaluation if the symptoms worsen or if the condition fails to improve as anticipated.  I provided  30  minutes of non-face-to-face time during this encounter.   Crecencio Mc, MD

## 2020-01-01 NOTE — Assessment & Plan Note (Signed)
Precipitated by tobacco cessation.  Appetite uncontrolled.  Did not tolerate phentermine in the past due to tachycardia.  Belviq and Qsymia not covered.  Trial of Saxenda

## 2020-01-01 NOTE — Assessment & Plan Note (Signed)
She has quit smoking  And notes increased appetite

## 2020-01-03 ENCOUNTER — Telehealth: Payer: Self-pay

## 2020-01-03 NOTE — Telephone Encounter (Signed)
PA submitted for saxenda on covermymeds.

## 2020-01-24 NOTE — Telephone Encounter (Signed)
PA has been approved through 05/03/2020.

## 2020-02-11 ENCOUNTER — Other Ambulatory Visit: Payer: Self-pay

## 2020-02-11 MED ORDER — SAXENDA 18 MG/3ML ~~LOC~~ SOPN: 0.6000 mg | PEN_INJECTOR | Freq: Every day | SUBCUTANEOUS | 0 refills | Status: DC

## 2020-03-24 ENCOUNTER — Other Ambulatory Visit: Payer: Self-pay

## 2020-03-24 MED ORDER — SAXENDA 18 MG/3ML ~~LOC~~ SOPN: 0.6000 mg | PEN_INJECTOR | Freq: Every day | SUBCUTANEOUS | 0 refills | Status: DC

## 2020-04-27 ENCOUNTER — Telehealth (INDEPENDENT_AMBULATORY_CARE_PROVIDER_SITE_OTHER): Payer: No Typology Code available for payment source | Admitting: Internal Medicine

## 2020-04-27 ENCOUNTER — Other Ambulatory Visit: Payer: Self-pay

## 2020-04-27 ENCOUNTER — Encounter: Payer: Self-pay | Admitting: Internal Medicine

## 2020-04-27 ENCOUNTER — Other Ambulatory Visit: Payer: Self-pay | Admitting: Internal Medicine

## 2020-04-27 VITALS — Ht 64.0 in | Wt 171.0 lb

## 2020-04-27 DIAGNOSIS — E669 Obesity, unspecified: Secondary | ICD-10-CM

## 2020-04-27 DIAGNOSIS — R5383 Other fatigue: Secondary | ICD-10-CM

## 2020-04-27 MED ORDER — PHENTERMINE HCL 37.5 MG PO TABS: 37.5000 mg | ORAL_TABLET | Freq: Every day | ORAL | 0 refills | Status: DC

## 2020-04-27 NOTE — Progress Notes (Signed)
Virtual Visit via Caregility  This visit type was conducted due to national recommendations for restrictions regarding the COVID-19 pandemic (e.g. social distancing).  This format is felt to be most appropriate for this patient at this time.  All issues noted in this document were discussed and addressed.  No physical exam was performed (except for noted visual exam findings with Video Visits).   I connected with@ on 04/27/20 at  4:30 PM EDT by a video enabled telemedicine application  and verified that I am speaking with the correct person using two identifiers. Location patient: home Location provider: work or home office Persons participating in the virtual visit: patient, provider  I discussed the limitations, risks, security and privacy concerns of performing an evaluation and management service by telephone and the availability of in person appointments. I also discussed with the patient that there may be a patient responsible charge related to this service. The patient expressed understanding and agreed to proceed.  Reason for visit: weight loss management follow up  HPI:  39 yr old female with history of obesity,  Started Saxenda for weight loss management  In early July.  Has been running daily for exercise and restricting carbohydrates and calories.  Has lost 10 lbs since start and finished her first 5K run this weekend.  Feels great , but weight has plateaued for the past 4 weeks.  woulk like to try a lower dose of phentermine sinc ethe 37.5 mg dose made her tachycardic     ROS: See pertinent positives and negatives per HPI.  Past Medical History:  Diagnosis Date  . Allergy   . Depression   . Tremors of nervous system    Essential Tremors (labetalol)    Past Surgical History:  Procedure Laterality Date  . TONSILLECTOMY  2010    Family History  Problem Relation Age of Onset  . Hyperlipidemia Mother   . Hypertension Mother   . Hyperthyroidism Mother   . Hyperlipidemia  Father   . Cancer Maternal Grandmother 58       metastatic breast ca   . Heart attack Maternal Grandfather   . Heart disease Maternal Grandfather        AMI  . Cancer Paternal Grandmother        NOn-Hodgkins Lymphoma  . Diabetes Paternal Grandmother   . Heart disease Paternal Grandfather     SOCIAL HX:  reports that she quit smoking about 4 years ago. Her smoking use included cigarettes. She smoked 0.25 packs per day. She has never used smokeless tobacco. She reports current alcohol use. She reports that she does not use drugs.   Current Outpatient Medications:  .  fexofenadine (ALLEGRA) 60 MG tablet, Take 60 mg by mouth 2 (two) times daily., Disp: , Rfl:  .  Insulin Pen Needle (PEN NEEDLES) 31G X 6 MM MISC, For use with victoza /saxenda, Disp: 100 each, Rfl: 0 .  levonorgestrel (MIRENA) 20 MCG/24HR IUD, 1 each by Intrauterine route once., Disp: , Rfl:  .  Liraglutide -Weight Management (SAXENDA) 18 MG/3ML SOPN, Inject 0.6 mg into the skin daily. Increase dose weekly as follows: Week 2: 1.2 mg daily ; Week 3: 1.8 mg daily; Week 4: 2.4 mg daily, Disp: 9 mL, Rfl: 0 .  phentermine (ADIPEX-P) 37.5 MG tablet, Take 1 tablet (37.5 mg total) by mouth daily before breakfast., Disp: 30 tablet, Rfl: 0  EXAM:  VITALS per patient if applicable:  GENERAL: alert, oriented, appears well and in no acute distress  HEENT:  atraumatic, conjunttiva clear, no obvious abnormalities on inspection of external nose and ears  NECK: normal movements of the head and neck  LUNGS: on inspection no signs of respiratory distress, breathing rate appears normal, no obvious gross SOB, gasping or wheezing  CV: no obvious cyanosis  MS: moves all visible extremities without noticeable abnormality  PSYCH/NEURO: pleasant and cooperative, no obvious depression or anxiety, speech and thought processing grossly intact  ASSESSMENT AND PLAN:  Discussed the following assessment and plan:  Fatigue, unspecified type -  Plan: Comprehensive metabolic panel  Obesity, Class I, BMI 30.0-34.9 (see actual BMI)  Obesity, Class I, BMI 30.0-34.9 (see actual BMI) She achieved a weight loss of 10 lbs  (5%) over a 3 month period but has plateaued despite exercising regularly (running) and feels that her appetite is not suppressed with Saxenda. Resume phentermine at 18 mg daily dose.  Advised that the medication does enter into breast milk so if she cannot pump prior to dosing,  It will not be a good option for her.     I discussed the assessment and treatment plan with the patient. The patient was provided an opportunity to ask questions and all were answered. The patient agreed with the plan and demonstrated an understanding of the instructions.   The patient was advised to call back or seek an in-person evaluation if the symptoms worsen or if the condition fails to improve as anticipated.  I provided 20 minutes of face-to-face time during this encounter reviewing patient's current problems and past surgeries, labs and imaging studies, providing counseling on the above mentioned problems , and coordination  of care .   Sherlene Shams, MD

## 2020-04-27 NOTE — Patient Instructions (Signed)
Brianna Washington,  I have refilled the phentermine.  IF YOU ARE STILL BREAST FEEDING YOU SHOULD PUMP BEFORE YOU TAKE THIS PILL BECAUSE IT WILL GO INTO YOUR BREAST MILK

## 2020-04-27 NOTE — Assessment & Plan Note (Signed)
She achieved a weight loss of 10 lbs  (5%) over a 3 month period but has plateaued despite exercising regularly (running) and feels that her appetite is not suppressed with Saxenda. Resume phentermine at 18 mg daily dose.  Advised that the medication does enter into breast milk so if she cannot pump prior to dosing,  It will not be a good option for her.

## 2020-04-28 ENCOUNTER — Other Ambulatory Visit (INDEPENDENT_AMBULATORY_CARE_PROVIDER_SITE_OTHER): Payer: No Typology Code available for payment source

## 2020-04-28 DIAGNOSIS — R5383 Other fatigue: Secondary | ICD-10-CM

## 2020-04-29 LAB — COMPREHENSIVE METABOLIC PANEL
ALT: 10 U/L (ref 0–35)
AST: 13 U/L (ref 0–37)
Albumin: 4.4 g/dL (ref 3.5–5.2)
Alkaline Phosphatase: 44 U/L (ref 39–117)
BUN: 9 mg/dL (ref 6–23)
CO2: 26 mEq/L (ref 19–32)
Calcium: 9.3 mg/dL (ref 8.4–10.5)
Chloride: 106 mEq/L (ref 96–112)
Creatinine, Ser: 0.79 mg/dL (ref 0.40–1.20)
GFR: 95.13 mL/min (ref >60.00)
Glucose, Bld: 82 mg/dL (ref 70–99)
Potassium: 3.8 mEq/L (ref 3.5–5.1)
Sodium: 140 mEq/L (ref 135–145)
Total Bilirubin: 0.7 mg/dL (ref 0.2–1.2)
Total Protein: 6.6 g/dL (ref 6.0–8.3)

## 2020-04-30 NOTE — Progress Notes (Signed)
Your  liver and kidney function are normal.  You do not need any medication changes. Please plan to repeat the labs in 6 months.    Regards,   Dr. Darrick Huntsman

## 2020-05-07 ENCOUNTER — Telehealth: Payer: Self-pay

## 2020-05-07 NOTE — Telephone Encounter (Signed)
Sent in Prior Authorization for Saxenda 18MG /3ML pen-injectors. Awaiting determination.

## 2020-05-12 ENCOUNTER — Ambulatory Visit (INDEPENDENT_AMBULATORY_CARE_PROVIDER_SITE_OTHER): Payer: No Typology Code available for payment source | Admitting: Internal Medicine

## 2020-05-12 ENCOUNTER — Encounter: Payer: Self-pay | Admitting: Internal Medicine

## 2020-05-12 ENCOUNTER — Other Ambulatory Visit: Payer: Self-pay

## 2020-05-12 VITALS — BP 102/74 | HR 69 | Temp 98.4°F | Resp 15 | Ht 64.0 in | Wt 176.8 lb

## 2020-05-12 DIAGNOSIS — I499 Cardiac arrhythmia, unspecified: Secondary | ICD-10-CM | POA: Insufficient documentation

## 2020-05-12 NOTE — Patient Instructions (Signed)
LABS TODAY  24  HOUR MONITOR ORDERED    CARDIOLOGY REFERRAL ORDERED   NO MORE PHENTERMINE  NOT MORE THAN ONE CAFFEINATED BEVERAGE DAILY

## 2020-05-12 NOTE — Progress Notes (Signed)
Subjective:  Patient ID: Brianna Washington, female    DOB: [birth year]  Age: 39 y.o. MRN: [id number]  CC: The encounter diagnosis was Irregular heart rate.  HPI KEYRA VIRELLA presents for evaluation of irregular heart rate  This visit occurred during the SARS-CoV-2 public health emergency.  Safety protocols were in place, including screening questions prior to the visit, additional usage of staff PPE, and extensive cleaning of exam room while observing appropriate contact time as indicated for disinfecting solutions.    Patient has received both doses of the available COVID 19 vaccine without complications.  Patient continues to mask when outside of the home except when walking in yard or at safe distances from others .  Patient denies any change in mood or development of unhealthy behaviors resuting from the pandemic's restriction of activities and socialization.    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  Rate changes from 50 to 120 throughout the day,  Became aware of it after getting a fit bit.  Some shortness of breath at times but no dizziness or presyncope or chest pain.  Does not Not occur while sleeping.  Stopped phentermine over a week ago but feels that the episode and variability was occurring before she started the medication.  Since stopping the medication the maximum heart rate has been around 100 bpm   Outpatient Medications Prior to Visit  Medication Sig Dispense Refill  . fexofenadine (ALLEGRA) 60 MG tablet Take 60 mg by mouth 2 (two) times daily.    Marland Kitchen levonorgestrel (MIRENA) 20 MCG/24HR IUD 1 each by Intrauterine route once.    . phentermine (ADIPEX-P) 37.5 MG tablet Take 1 tablet (37.5 mg total) by mouth daily before breakfast. (Patient not taking: Reported on 05/12/2020) 30 tablet 0  . Insulin Pen Needle (PEN NEEDLES) 31G X 6 MM MISC For use with victoza /saxenda (Patient not taking: Reported on 05/12/2020) 100 each 0  . Liraglutide -Weight  Management (SAXENDA) 18 MG/3ML SOPN Inject 0.6 mg into the skin daily. Increase dose weekly as follows: Week 2: 1.2 mg daily ; Week 3: 1.8 mg daily; Week 4: 2.4 mg daily (Patient not taking: Reported on 05/12/2020) 9 mL 0   No facility-administered medications prior to visit.    Review of Systems;  Patient denies headache, fevers, malaise, unintentional weight loss, skin rash, eye pain, sinus congestion and sinus pain, sore throat, dysphagia,  hemoptysis , cough, dyspnea, wheezing, chest pain, palpitations, orthopnea, edema, abdominal pain, nausea, melena, diarrhea, constipation, flank pain, dysuria, hematuria, urinary  Frequency, nocturia, numbness, tingling, seizures,  Focal weakness, Loss of consciousness,  Tremor, insomnia, depression, anxiety, and suicidal ideation.      Objective:  BP 102/74 (BP Location: Left Arm, Patient Position: Sitting, Cuff Size: Normal)   Pulse 69   Temp 98.4 F (36.9 C) (Oral)   Resp 15   Ht 5\' 4"  (1.626 m)   Wt 176 lb 12.8 oz (80.2 kg)   SpO2 99%   BMI 30.35 kg/m   BP Readings from Last 3 Encounters:  05/12/20 102/74  10/30/19 100/65  12/07/18 (!) 90/58    Wt Readings from Last 3 Encounters:  05/12/20 176 lb 12.8 oz (80.2 kg)  04/27/20 171 lb (77.6 kg)  12/31/19 179 lb (81.2 kg)    General appearance: alert, cooperative and appears stated age Ears: normal TM's and external ear canals both ears Throat: lips, mucosa, and tongue normal; teeth and gums normal Neck: no  adenopathy, no carotid bruit, supple, symmetrical, trachea midline and thyroid not enlarged, symmetric, no tenderness/mass/nodules Back: symmetric, no curvature. ROM normal. No CVA tenderness. Lungs: clear to auscultation bilaterally Heart: regular rate and rhythm, S1, S2 normal, no murmur, click, rub or gallop Abdomen: soft, non-tender; bowel sounds normal; no masses,  no organomegaly Pulses: 2+ and symmetric Skin: Skin color, texture, turgor normal. No rashes or lesions Lymph  nodes: Cervical, supraclavicular, and axillary nodes normal.  Lab Results  Component Value Date   HGBA1C 4.9 05/09/2016    Lab Results  Component Value Date   CREATININE 0.79 04/28/2020   CREATININE 0.68 10/30/2019   CREATININE 0.73 08/15/2018    Lab Results  Component Value Date   WBC 7.0 10/30/2019   HGB 12.1 10/30/2019   HCT 36.9 10/30/2019   PLT 216 10/30/2019   GLUCOSE 82 04/28/2020   CHOL 147 10/30/2019   TRIG 70 10/30/2019   HDL 47 10/30/2019   LDLDIRECT 147.5 07/26/2013   LDLCALC 86 10/30/2019   ALT 10 04/28/2020   AST 13 04/28/2020   NA 140 04/28/2020   K 3.8 04/28/2020   CL 106 04/28/2020   CREATININE 0.79 04/28/2020   BUN 9 04/28/2020   CO2 26 04/28/2020   TSH 1.37 08/15/2018   HGBA1C 4.9 05/09/2016    No results found.  Assessment & Plan:   Problem List Items Addressed This Visit      Unprioritized   Irregular heart rate - Primary    Etiology may be benign as she has not had any symptoms of atrial fibrillation or v tach, just labile heart rate.  I have ordered and reviewed a 12 lead EKG and find that there are no acute changes and patient is in sinus rhythm.  However, she has evidence of left atrium dilation  Do cardiology evaluation is warranted to evaluate with ECHO  Checking thyroid,  Lytes.  Advised to reduce caffeine intake to one serving daily.  Phentermine suspended . Holter monitor ordered and cardiology evaluation       Relevant Orders   EKG 12-Lead (Completed)   TSH   CBC with Differential/Platelet   Magnesium   Basic metabolic panel   Holter monitor - 24 hour   Ambulatory referral to Cardiology     A total of 40 minutes was spent with patient more than half of which was spent in counseling patient on the above mentioned issues , reviewing and explaining recent labs and imaging studies done, and coordination of care.  I have discontinued Sapphire E. Altmann's Pen Needles and Saxenda. I am also having her maintain her levonorgestrel,  fexofenadine, and phentermine.  No orders of the defined types were placed in this encounter.   Medications Discontinued During This Encounter  Medication Reason  . Insulin Pen Needle (PEN NEEDLES) 31G X 6 MM MISC   . Liraglutide -Weight Management (SAXENDA) 18 MG/3ML SOPN     Follow-up: No follow-ups on file.   Sherlene Shams, MD

## 2020-05-12 NOTE — Addendum Note (Signed)
Addended by: Sherlene Shams on: 05/12/2020 09:44 PM   Modules accepted: Level of Service

## 2020-05-12 NOTE — Assessment & Plan Note (Signed)
Etiology may be benign as she has not had any symptoms of atrial fibrillation or v tach, just labile heart rate.  I have ordered and reviewed a 12 lead EKG and find that there are no acute changes and patient is in sinus rhythm.  However, she has evidence of left atrium dilation  Do cardiology evaluation is warranted to evaluate with ECHO  Checking thyroid,  Lytes.  Advised to reduce caffeine intake to one serving daily.  Phentermine suspended . Holter monitor ordered and cardiology evaluation

## 2020-05-13 ENCOUNTER — Telehealth: Payer: Self-pay | Admitting: Internal Medicine

## 2020-05-13 LAB — CBC WITH DIFFERENTIAL/PLATELET
Basophils Absolute: 0.1 10*3/uL (ref 0.0–0.1)
Basophils Relative: 0.9 % (ref 0.0–3.0)
Eosinophils Absolute: 0.1 10*3/uL (ref 0.0–0.7)
Eosinophils Relative: 1.5 % (ref 0.0–5.0)
HCT: 37.5 % (ref 36.0–46.0)
Hemoglobin: 12.3 g/dL (ref 12.0–15.0)
Lymphocytes Relative: 34 % (ref 12.0–46.0)
Lymphs Abs: 2.1 10*3/uL (ref 0.7–4.0)
MCHC: 32.8 g/dL (ref 30.0–36.0)
MCV: 93.6 fl (ref 78.0–100.0)
Monocytes Absolute: 0.6 10*3/uL (ref 0.1–1.0)
Monocytes Relative: 10.1 % (ref 3.0–12.0)
Neutro Abs: 3.2 10*3/uL (ref 1.4–7.7)
Neutrophils Relative %: 53.5 % (ref 43.0–77.0)
Platelets: 224 10*3/uL (ref 150.0–400.0)
RBC: 4.01 Mil/uL (ref 3.87–5.11)
RDW: 14.6 % (ref 11.5–15.5)
WBC: 6.1 10*3/uL (ref 4.0–10.5)

## 2020-05-13 LAB — BASIC METABOLIC PANEL
BUN: 10 mg/dL (ref 6–23)
CO2: 25 mEq/L (ref 19–32)
Calcium: 8.9 mg/dL (ref 8.4–10.5)
Chloride: 105 mEq/L (ref 96–112)
Creatinine, Ser: 0.82 mg/dL (ref 0.40–1.20)
GFR: 91.15 mL/min (ref >60.00)
Glucose, Bld: 84 mg/dL (ref 70–99)
Potassium: 3.8 mEq/L (ref 3.5–5.1)
Sodium: 140 mEq/L (ref 135–145)

## 2020-05-13 LAB — MAGNESIUM: Magnesium: 1.8 mg/dL (ref 1.5–2.5)

## 2020-05-13 LAB — TSH: TSH: 1.25 u[IU]/mL (ref 0.35–4.50)

## 2020-05-13 NOTE — Telephone Encounter (Signed)
Ok I have a answer for pt's when needing a heart monitor they do get it done at the hospital , however I have to call to Centralized scheduling for heart or pt can call.

## 2020-05-13 NOTE — Addendum Note (Signed)
Addended by: Bonnell Public I on: 05/13/2020 01:01 PM   Modules accepted: Orders

## 2020-05-13 NOTE — Progress Notes (Signed)
Your electrolytes, thyroid and CBC are  normal  Regards,   Duncan Dull, MD

## 2020-05-13 NOTE — Telephone Encounter (Signed)
Left pt vm regarding appt on 05/20/2020 [facility]

## 2020-05-13 NOTE — Telephone Encounter (Signed)
Patient is confused to where she goes to have her heart monitor put on. She was told that the hospital had to put it on.

## 2020-05-18 ENCOUNTER — Ambulatory Visit: Payer: No Typology Code available for payment source | Admitting: Cardiology

## 2020-05-18 ENCOUNTER — Telehealth: Payer: Self-pay | Admitting: Cardiology

## 2020-05-18 NOTE — Telephone Encounter (Signed)
Please call to discuss holter monitor. Patient states she was speaking with someone in Cardio-Pulmonary and they suggested she call to see if she can get a monitor placed from our office and not cardio pulmonary.

## 2020-05-20 ENCOUNTER — Ambulatory Visit
Admission: RE | Admit: 2020-05-20 | Discharge: 2020-05-20 | Disposition: A | Discharge status: Home / Self Care | Payer: No Typology Code available for payment source | Source: Ambulatory Visit | Attending: Internal Medicine | Admitting: Internal Medicine

## 2020-05-20 ENCOUNTER — Other Ambulatory Visit: Payer: Self-pay

## 2020-05-20 DIAGNOSIS — I499 Cardiac arrhythmia, unspecified: Secondary | ICD-10-CM | POA: Diagnosis not present

## 2020-05-27 ENCOUNTER — Telehealth: Payer: Self-pay | Admitting: Internal Medicine

## 2020-05-27 NOTE — Telephone Encounter (Signed)
NO .  NOT AVAILABLE

## 2020-05-27 NOTE — Telephone Encounter (Signed)
Have you received these results yet? 

## 2020-05-27 NOTE — Telephone Encounter (Signed)
Pt called and wanted to get the results of her 24  HOLTER MONITOR when she had it placed they told her to contact the Provider that ordered it

## 2020-05-27 NOTE — Telephone Encounter (Signed)
Spoke with pt and let her know that we have not received the results yet but that we could call her as soon as we get them. Pt gave a verbal understanding.

## 2020-06-01 ENCOUNTER — Ambulatory Visit: Payer: No Typology Code available for payment source | Admitting: Cardiology

## 2020-06-01 ENCOUNTER — Other Ambulatory Visit: Payer: Self-pay

## 2020-06-01 ENCOUNTER — Encounter: Payer: Self-pay | Admitting: Cardiology

## 2020-06-01 VITALS — BP 118/62 | HR 80 | Ht 64.0 in | Wt 178.4 lb

## 2020-06-01 DIAGNOSIS — I499 Cardiac arrhythmia, unspecified: Secondary | ICD-10-CM

## 2020-06-01 NOTE — Progress Notes (Signed)
Cardiology Office Note:    Date:  06/01/2020   ID:  Brianna Washington, DOB 1981-05-08, MRN [id number]  PCP:  Sherlene Shams, MD  [facility] Cardiologist:  No primary care provider on file.  [facility] Electrophysiologist:  None   Referring MD: Sherlene Shams, MD   Chief Complaint  Patient presents with  . OTHER    Irregular heart beat. Meds reviewed verbally with pt.   Brianna Washington is a 39 y.o. female who is being seen today for the evaluation of irregular heart beat at the request of Sherlene Shams, MD.   History of Present Illness:    Brianna Washington is a 39 y.o. female with no significant cardiac history who presents due to irregular heart rates.  Patient started exercising more and took phentermine about a month ago trying to lose weight.  She has noticed her heart rate on her smart watch varied from lows of 50s to highs of 110/120s.  She denies any palpitations, chest pain, shortness of breath.  Denies any history of heart disease.  She states if she did not check her smart watch she will not notice any variability.  A 24-hour cardiac monitor was placed on 05/20/2020.  She otherwise feels well, runs frequently without any symptoms  Past Medical History:  Diagnosis Date  . Allergy   . Depression   . Tremors of nervous system    Essential Tremors (labetalol)    Past Surgical History:  Procedure Laterality Date  . TONSILLECTOMY  2010    Current Medications: Current Meds  Medication Sig  . fexofenadine (ALLEGRA) 60 MG tablet Take 60 mg by mouth daily.   Marland Kitchen levonorgestrel (MIRENA) 20 MCG/24HR IUD 1 each by Intrauterine route once.     Allergies:   Patient has no known allergies.   Social History   Socioeconomic History  . Marital status: Married    Spouse name: Not on file  . Number of children: Not on file  . Years of education: Not on file  . Highest education level: Not on file  Occupational History  . Not on file  Tobacco Use  . Smoking  status: Former Smoker    Packs/day: 0.25    Types: Cigarettes    Quit date: 09/23/2015    Years since quitting: 4.6  . Smokeless tobacco: Never Used  Vaping Use  . Vaping Use: Never used  Substance and Sexual Activity  . Alcohol use: Yes    Comment: once a week 5oz  . Drug use: No  . Sexual activity: Yes    Birth control/protection: I.U.D.  Other Topics Concern  . Not on file  Social History Narrative   2 kids    Works Furniture conservator/restorer   Social Determinants of Health   Financial Resource Strain:   . Difficulty of Paying Living Expenses: Not on file  Food Insecurity:   . Worried About Programme researcher, broadcasting/film/video in the Last Year: Not on file  . Ran Out of Food in the Last Year: Not on file  Transportation Needs:   . Lack of Transportation (Medical): Not on file  . Lack of Transportation (Non-Medical): Not on file  Physical Activity:   . Days of Exercise per Week: Not on file  . Minutes of Exercise per Session: Not on file  Stress:   . Feeling of Stress : Not on file  Social Connections:   . Frequency of Communication with Friends and Family: Not on file  .  Frequency of Social Gatherings with Friends and Family: Not on file  . Attends Religious Services: Not on file  . Active Member of Clubs or Organizations: Not on file  . Attends Banker Meetings: Not on file  . Marital Status: Not on file     Family History: The patient's family history includes Cancer in her paternal grandmother; Cancer (age of onset: 60) in her maternal grandmother; Diabetes in her paternal grandmother; Heart attack in her maternal grandfather; Heart disease in her maternal grandfather and paternal grandfather; Hyperlipidemia in her father and mother; Hypertension in her mother; Hyperthyroidism in her mother.  ROS:   Please see the history of present illness.     All other systems reviewed and are negative.  EKGs/Labs/Other Studies Reviewed:    The following studies were reviewed today:   EKG:   EKG is  ordered today.  The ekg ordered today demonstrates sinus rhythm, normal ECG.  Recent Labs: 04/28/2020: ALT 10 05/12/2020: Hemoglobin 12.3; Magnesium 1.8; Platelets 224.0; TSH 1.25 05/13/2020: BUN 10; Creatinine, Ser 0.82; Potassium 3.8; Sodium 140  Recent Lipid Panel    Component Value Date/Time   CHOL 147 10/30/2019 1535   TRIG 70 10/30/2019 1535   HDL 47 10/30/2019 1535   CHOLHDL 3.1 10/30/2019 1535   CHOLHDL 4.1 12/05/2017 0652   VLDL 16 12/05/2017 0652   LDLCALC 86 10/30/2019 1535   LDLDIRECT 147.5 07/26/2013 1109     Risk Assessment/Calculations:      Physical Exam:    VS:  BP 118/62 (BP Location: Right Arm, Patient Position: Sitting, Cuff Size: Normal)   Pulse 80   Ht 5\' 4"  (1.626 m)   Wt 178 lb 6 oz (80.9 kg)   SpO2 99%   BMI 30.62 kg/m     Wt Readings from Last 3 Encounters:  06/01/20 178 lb 6 oz (80.9 kg)  05/12/20 176 lb 12.8 oz (80.2 kg)  04/27/20 171 lb (77.6 kg)     GEN:  Well nourished, well developed in no acute distress HEENT: Normal NECK: No JVD; No carotid bruits LYMPHATICS: No lymphadenopathy CARDIAC: RRR, no murmurs, rubs, gallops RESPIRATORY:  Clear to auscultation without rales, wheezing or rhonchi  ABDOMEN: Soft, non-tender, non-distended MUSCULOSKELETAL:  No edema; No deformity  SKIN: Warm and dry NEUROLOGIC:  Alert and oriented x 3 PSYCHIATRIC:  Normal affect   ASSESSMENT:    1. Irregular heart rate    PLAN:    In order of problems listed above:  1. Patient with heart rate variability.  24-hour cardiac monitor reviewed by myself showed sinus rhythm/sinus tachycardia.  No pathologic arrhythmias noted.  Patient made aware of results and reassured.  No additional cardiac testing indicated.  EKG shows normal sinus rhythm normal ECG.  Patient advised to continue exercising.  Follow-up as needed    Shared Decision Making/Informed Consent       Medication Adjustments/Labs and Tests Ordered: Current medicines are reviewed  at length with the patient today.  Concerns regarding medicines are outlined above.  Orders Placed This Encounter  Procedures  . EKG 12-Lead   No orders of the defined types were placed in this encounter.   Patient Instructions  Medication Instructions:  Your physician recommends that you continue on your current medications as directed. Please refer to the Current Medication list given to you today.  *If you need a refill on your cardiac medications before your next appointment, please call your pharmacy*   Lab Work: None Ordered If you have labs (  blood work) drawn today and your tests are completely normal, you will receive your results only by: Marland Kitchen MyChart Message (if you have MyChart) OR . A paper copy in the mail If you have any lab test that is abnormal or we need to change your treatment, we will call you to review the results.   Testing/Procedures: None Ordered   Follow-Up: At [facility], you and your health needs are our priority.  As part of our continuing mission to provide you with exceptional heart care, we have created designated Provider Care Teams.  These Care Teams include your primary Cardiologist (physician) and Advanced Practice Providers (APPs -  Physician Assistants and Nurse Practitioners) who all work together to provide you with the care you need, when you need it.  We recommend signing up for the patient portal called "MyChart".  Sign up information is provided on this After Visit Summary.  MyChart is used to connect with patients for Virtual Visits (Telemedicine).  Patients are able to view lab/test results, encounter notes, upcoming appointments, etc.  Non-urgent messages can be sent to your provider as well.   To learn more about what you can do with MyChart, go to [email or web address].    Your next appointment:   Folllow up as needed   The format for your next appointment:   In Person  Provider:   Debbe Odea, MD   Other  Instructions     Signed, Debbe Odea, MD  06/01/2020 12:56 PM    [facility]

## 2020-06-01 NOTE — Patient Instructions (Signed)
Medication Instructions:  Your physician recommends that you continue on your current medications as directed. Please refer to the Current Medication list given to you today.  *If you need a refill on your cardiac medications before your next appointment, please call your pharmacy*   Lab Work: None Ordered If you have labs (blood work) drawn today and your tests are completely normal, you will receive your results only by: . MyChart Message (if you have MyChart) OR . A paper copy in the mail If you have any lab test that is abnormal or we need to change your treatment, we will call you to review the results.   Testing/Procedures: None Ordered   Follow-Up: At [facility], you and your health needs are our priority.  As part of our continuing mission to provide you with exceptional heart care, we have created designated Provider Care Teams.  These Care Teams include your primary Cardiologist (physician) and Advanced Practice Providers (APPs -  Physician Assistants and Nurse Practitioners) who all work together to provide you with the care you need, when you need it.  We recommend signing up for the patient portal called "MyChart".  Sign up information is provided on this After Visit Summary.  MyChart is used to connect with patients for Virtual Visits (Telemedicine).  Patients are able to view lab/test results, encounter notes, upcoming appointments, etc.  Non-urgent messages can be sent to your provider as well.   To learn more about what you can do with MyChart, go to [email or web address].    Your next appointment:   Folllow up as needed   The format for your next appointment:   In Person  Provider:   Brian Agbor-Etang, MD   Other Instructions  

## 2020-09-30 ENCOUNTER — Other Ambulatory Visit: Payer: Self-pay | Admitting: Internal Medicine

## 2020-09-30 ENCOUNTER — Other Ambulatory Visit: Payer: Self-pay

## 2020-09-30 ENCOUNTER — Encounter: Payer: Self-pay | Admitting: Internal Medicine

## 2020-09-30 ENCOUNTER — Ambulatory Visit (INDEPENDENT_AMBULATORY_CARE_PROVIDER_SITE_OTHER): Payer: No Typology Code available for payment source | Admitting: Internal Medicine

## 2020-09-30 DIAGNOSIS — E669 Obesity, unspecified: Secondary | ICD-10-CM

## 2020-09-30 MED ORDER — OZEMPIC (0.25 OR 0.5 MG/DOSE) 2 MG/1.5ML ~~LOC~~ SOPN: 0.2500 mg | PEN_INJECTOR | SUBCUTANEOUS | 0 refills | Status: DC

## 2020-09-30 NOTE — Progress Notes (Signed)
Subjective:  Patient ID: Brianna Washington, female    DOB: July 31, 1980  Age: 40 y.o. MRN: [id number]  CC: The encounter diagnosis was Obesity (BMI 30.0-34.9).  HPI Brianna Washington presents for FOLLOW UP ON WEIGHT GAIN.  This visit occurred during the SARS-CoV-2 public health emergency.  Safety protocols were in place, including screening questions prior to the visit, adsigditional usage of staff PPE, and extensive cleaning of exam room while observing appropriate contact time as indicated for disinfecting solutions.   Patient has tried [place] without success  and did not tolerate phentermine due to palpitations.  She has been intentionally losing weight since November using the Weight watchers program and an intermittent fasting routine .  She has reduced her diet sodas to one per day.    Outpatient Medications Prior to Visit  Medication Sig Dispense Refill  . Cholecalciferol (VITAMIN D3 PO) Take 1 capsule by mouth daily.    . fexofenadine (ALLEGRA) 60 MG tablet Take 60 mg by mouth daily.     Marland Kitchen levonorgestrel (MIRENA) 20 MCG/24HR IUD 1 each by Intrauterine route once.    . vitamin C (ASCORBIC ACID) 500 MG tablet Take 500 mg by mouth 2 (two) times daily.     No facility-administered medications prior to visit.    Review of Systems;  Patient denies headache, fevers, malaise, unintentional weight loss, skin rash, eye pain, sinus congestion and sinus pain, sore throat, dysphagia,  hemoptysis , cough, dyspnea, wheezing, chest pain, palpitations, orthopnea, edema, abdominal pain, nausea, melena, diarrhea, constipation, flank pain, dysuria, hematuria, urinary  Frequency, nocturia, numbness, tingling, seizures,  Focal weakness, Loss of consciousness,  Tremor, insomnia, depression, anxiety, and suicidal ideation.      Objective:  BP (!) 96/58   Pulse 95   Temp 98.1 F (36.7 C)   Ht 5\' 4"  (1.626 m)   Wt 185 lb 6.4 oz (84.1 kg)   SpO2 97%   BMI 31.82 kg/m   BP Readings from Last 3  Encounters:  09/30/20 (!) 96/58  06/01/20 118/62  05/12/20 102/74    Wt Readings from Last 3 Encounters:  09/30/20 185 lb 6.4 oz (84.1 kg)  06/01/20 178 lb 6 oz (80.9 kg)  05/12/20 176 lb 12.8 oz (80.2 kg)    General appearance: alert, cooperative and appears stated age Ears: normal TM's and external ear canals both ears Throat: lips, mucosa, and tongue normal; teeth and gums normal Neck: no adenopathy, no carotid bruit, supple, symmetrical, trachea midline and thyroid not enlarged, symmetric, no tenderness/mass/nodules Back: symmetric, no curvature. ROM normal. No CVA tenderness. Lungs: clear to auscultation bilaterally Heart: regular rate and rhythm, S1, S2 normal, no murmur, click, rub or gallop Abdomen: soft, non-tender; bowel sounds normal; no masses,  no organomegaly Pulses: 2+ and symmetric Skin: Skin color, texture, turgor normal. No rashes or lesions Lymph nodes: Cervical, supraclavicular, and axillary nodes normal.  Lab Results  Component Value Date   HGBA1C 4.9 05/09/2016    Lab Results  Component Value Date   CREATININE 0.82 05/13/2020   CREATININE 0.79 04/28/2020   CREATININE 0.68 10/30/2019    Lab Results  Component Value Date   WBC 6.1 05/12/2020   HGB 12.3 05/12/2020   HCT 37.5 05/12/2020   PLT 224.0 05/12/2020   GLUCOSE 84 05/13/2020   CHOL 147 10/30/2019   TRIG 70 10/30/2019   HDL 47 10/30/2019   LDLDIRECT 147.5 07/26/2013   LDLCALC 86 10/30/2019   ALT 10 04/28/2020   AST 13 04/28/2020  NA 140 05/13/2020   K 3.8 05/13/2020   CL 105 05/13/2020   CREATININE 0.82 05/13/2020   BUN 10 05/13/2020   CO2 25 05/13/2020   TSH 1.25 05/12/2020   HGBA1C 4.9 05/09/2016    No results found.  Assessment & Plan:   Problem List Items Addressed This Visit      Unprioritized   Obesity (BMI 30.0-34.9)    She is requesting pharmacotherapy to help her achieve her goals.  Past trials of Saxenda and phentermine were either not successful or not tolerated.   Trial of ozempic recommended [facility] is not)          [name] (0.25 or 0.5 MG/DOSE). I am also having her maintain her levonorgestrel, fexofenadine, vitamin C, and Cholecalciferol (VITAMIN D3 PO).  Meds ordered this encounter  Medications  . Semaglutide,0.25 or 0.5MG /DOS, (OZEMPIC, 0.25 OR 0.5 MG/DOSE,) 2 MG/1.5ML SOPN    Sig: Inject 0.25 mg into the skin once a week.    Dispense:  1.5 mL    Refill:  0    There are no discontinued medications.  Follow-up: No follow-ups on file.   Sherlene Shams, MD

## 2020-09-30 NOTE — Patient Instructions (Addendum)
Ozempic once  A week starting at 0.25 mg dose for one month   You can increase dose to 0.5 mg  Per dose if needed t curb appetite after one month  Don't skip  meals.

## 2020-10-01 NOTE — Assessment & Plan Note (Signed)
She is requesting pharmacotherapy to help her achieve her goals.  Past trials of Saxenda and phentermine were either not successful or not tolerated.  Trial of ozempic recommended [facility] is not)

## 2020-10-09 ENCOUNTER — Ambulatory Visit: Payer: No Typology Code available for payment source | Admitting: Internal Medicine

## 2020-10-21 ENCOUNTER — Encounter: Payer: Self-pay | Admitting: Internal Medicine

## 2020-10-21 ENCOUNTER — Telehealth (INDEPENDENT_AMBULATORY_CARE_PROVIDER_SITE_OTHER): Payer: No Typology Code available for payment source | Admitting: Internal Medicine

## 2020-10-21 DIAGNOSIS — B349 Viral infection, unspecified: Secondary | ICD-10-CM | POA: Diagnosis not present

## 2020-10-21 MED ORDER — ONDANSETRON 4 MG PO TBDP: 4.0000 mg | ORAL_TABLET | Freq: Three times a day (TID) | ORAL | 0 refills | Status: DC | PRN

## 2020-10-21 MED ORDER — PREDNISONE 10 MG PO TABS: ORAL_TABLET | ORAL | 0 refills | Status: DC

## 2020-10-21 MED ORDER — AMOXICILLIN-POT CLAVULANATE 875-125 MG PO TABS: 1.0000 | ORAL_TABLET | Freq: Two times a day (BID) | ORAL | 0 refills | Status: DC

## 2020-10-21 NOTE — Progress Notes (Signed)
Virtual Visit via caregility  Note  This visit type was conducted due to national recommendations for restrictions regarding the COVID-19 pandemic (e.g. social distancing).  This format is felt to be most appropriate for this patient at this time.  All issues noted in this document were discussed and addressed.  No physical exam was performed (except for noted visual exam findings with Video Visits).   I connected with@ on 10/21/20 at  4:30 PM EDT by a video enabled telemedicine applicationand verified that I am speaking with the correct person using two identifiers. Location patient: home Location provider: work or home office Persons participating in the virtual visit: patient, provider  I discussed the limitations, risks, security and privacy concerns of performing an evaluation and management service by telephone and the availability of in person appointments. I also discussed with the patient that there may be a patient responsible charge related to this service. The patient expressed understanding and agreed to proceed.   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 Worked  On Monday after home COVID test was negative on Monday.  Developed nausea and vomiting yesterday ,  Multiple episodes, last emesis was 7:30 am today. Able to keep down crackers and soup. Today. Denies fevers, body aches, diarrhea. Supposed to return to work tomorrow.   Son is in daycare and had a one day GI illness last week after she was notified of a viral outbreak at his daycare center     ROS: See pertinent positives and negatives per HPI.  Past Medical History:  Diagnosis Date  . Allergy   . Depression   . Tremors of nervous system    Essential Tremors (labetalol)    Past Surgical History:  Procedure Laterality Date  . TONSILLECTOMY  2010    Family History  Problem Relation Age of Onset  . Hyperlipidemia Mother   .  Hypertension Mother   . Hyperthyroidism Mother   . Hyperlipidemia Father   . Cancer Maternal Grandmother 68       metastatic breast ca   . Heart attack Maternal Grandfather   . Heart disease Maternal Grandfather        AMI  . Cancer Paternal Grandmother        NOn-Hodgkins Lymphoma  . Diabetes Paternal Grandmother   . Heart disease Paternal Grandfather     SOCIAL HX:  reports that she quit smoking about 5 years ago. Her smoking use included cigarettes. She smoked 0.25 packs per day. She has never used smokeless tobacco. She reports current alcohol use. She reports that she does not use drugs.   Current Outpatient Medications:  .  amoxicillin-clavulanate (AUGMENTIN) 875-125 MG tablet, Take 1 tablet by mouth 2 (two) times daily., Disp: 14 tablet, Rfl: 0 .  cetirizine (ZYRTEC) 10 MG tablet, Take 10 mg by mouth daily., Disp: , Rfl:  .  Cholecalciferol (VITAMIN D3 PO), Take 1 capsule by mouth daily., Disp: , Rfl:  .  diphenhydrAMINE (BENADRYL) 25 MG tablet, Take 25 mg by mouth every 6 (six) hours as needed., Disp: , Rfl:  .  fexofenadine (ALLEGRA) 60 MG tablet, Take 60 mg by mouth daily. , Disp: , Rfl:  .  fluticasone (FLONASE) 50 MCG/ACT nasal spray, Place 1 spray into both nostrils daily., Disp: , Rfl:  .  levonorgestrel (MIRENA) 20 MCG/24HR IUD, 1 each by Intrauterine route once., Disp: , Rfl:  .  ondansetron (ZOFRAN  ODT) 4 MG disintegrating tablet, Take 1 tablet (4 mg total) by mouth every 8 (eight) hours as needed for nausea or vomiting., Disp: 20 tablet, Rfl: 0 .  predniSONE (DELTASONE) 10 MG tablet, 6 tablets on Day 1 , then reduce by 1 tablet daily until gone, Disp: 21 tablet, Rfl: 0 .  Semaglutide,0.25 or 0.5MG /DOS, 2 MG/1.5ML SOPN, INJECT 0.25 MG INTO THE SKIN ONCE A WEEK., Disp: 1.5 mL, Rfl: 0 .  vitamin C (ASCORBIC ACID) 500 MG tablet, Take 500 mg by mouth 2 (two) times daily., Disp: , Rfl:   EXAM:  VITALS per patient if applicable:  GENERAL: alert, oriented, appears mildly  ill but not in acute distress  HEENT: atraumatic, conjunttiva clear, no obvious abnormalities on inspection of external nose and ears  NECK: normal movements of the head and neck  LUNGS: on inspection no signs of respiratory distress, breathing rate appears normal, no obvious gross SOB, gasping or wheezing  CV: no obvious cyanosis  MS: moves all visible extremities without noticeable abnormality  PSYCH/NEURO: pleasant and cooperative, no obvious depression or anxiety, speech and thought processing grossly intact  ASSESSMENT AND PLAN:  Discussed the following assessment and plan:  Viral illness  Viral illness Symptoms of URI without cough or purulent discharge lasting 5 days.  GI symptoms lasted < 24 hours.  Advised to retest at home for COVID tonight.zofran, augmentin and prednisone sent to [facility] in [place]     I discussed the assessment and treatment plan with the patient. The patient was provided an opportunity to ask questions and all were answered. The patient agreed with the plan and demonstrated an understanding of the instructions.   The patient was advised to call back or seek an in-person evaluation if the symptoms worsen or if the condition fails to improve as anticipated.   I spent 30 minutes dedicated to the care of this patient on the date of this encounter to include pre-visit review of his medical history,  Face-to-face time with the patient , and post visit ordering of testing and therapeutics.    Sherlene Shams, MD

## 2020-10-21 NOTE — Assessment & Plan Note (Signed)
Symptoms of URI without cough or purulent discharge lasting 5 days.  GI symptoms lasted < 24 hours.  Advised to retest at home for COVID tonight.zofran, augmentin and prednisone sent [facility]

## 2020-11-04 ENCOUNTER — Ambulatory Visit (INDEPENDENT_AMBULATORY_CARE_PROVIDER_SITE_OTHER): Payer: No Typology Code available for payment source | Admitting: Obstetrics and Gynecology

## 2020-11-04 ENCOUNTER — Encounter: Payer: Self-pay | Admitting: Obstetrics and Gynecology

## 2020-11-04 ENCOUNTER — Other Ambulatory Visit: Payer: Self-pay

## 2020-11-04 ENCOUNTER — Other Ambulatory Visit (HOSPITAL_COMMUNITY)
Admission: RE | Admit: 2020-11-04 | Discharge: 2020-11-04 | Disposition: A | Discharge status: Home / Self Care | Payer: No Typology Code available for payment source | Source: Ambulatory Visit | Attending: Obstetrics and Gynecology | Admitting: Obstetrics and Gynecology

## 2020-11-04 VITALS — BP 118/69 | HR 77 | Ht 64.0 in | Wt 178.9 lb

## 2020-11-04 DIAGNOSIS — E669 Obesity, unspecified: Secondary | ICD-10-CM

## 2020-11-04 DIAGNOSIS — Z975 Presence of (intrauterine) contraceptive device: Secondary | ICD-10-CM

## 2020-11-04 DIAGNOSIS — Z124 Encounter for screening for malignant neoplasm of cervix: Secondary | ICD-10-CM

## 2020-11-04 DIAGNOSIS — R5383 Other fatigue: Secondary | ICD-10-CM | POA: Diagnosis not present

## 2020-11-04 DIAGNOSIS — Z01419 Encounter for gynecological examination (general) (routine) without abnormal findings: Secondary | ICD-10-CM

## 2020-11-04 NOTE — Patient Instructions (Signed)

## 2020-11-04 NOTE — Progress Notes (Signed)
GYN-Pt stated that she was doing well.

## 2020-11-04 NOTE — Progress Notes (Signed)
GYNECOLOGY ANNUAL PHYSICAL EXAM PROGRESS NOTE  Subjective:    Brianna Washington is a 40 y.o. G65P2001 female who presents for an annual exam. The patient is sexually active. The patient wears seatbelts: yes. The patient participates in regular exercise: yes.    The patient has no complaints today:  1. Working to lose weight. Currently on Ozempic.  2. Notes some fatigue. Has been ongoing for several months.    Gynecologic History                                                                Menarche age: 68 No LMP recorded. (Menstrual status: IUD). Contraception: Mirena IUD (inserted 01/30/2017).  History of STI's: Denies Last Pap: 06/14/2017. Results were: normal.  Denies h/o abnormal pap smears    OB History  Gravida Para Term Preterm AB Living  2 2 2  0 0 1  SAB IAB Ectopic Multiple Live Births  0 0 0 0 1    # Outcome Date GA Lbr Len/2nd Weight Sex Delivery Anes PTL Lv  2 Term 12/18/16 [redacted]w[redacted]d / 01:31 7 lb 8.3 oz (3.41 kg) M Vag-Spont EPI       Name: Meadors,BOY Bayle  1 Term 08/21/14 [redacted]w[redacted]d  6 lb 6 oz (2.892 kg) F Vag-Spont EPI N LIV     Apgar1: 8  Apgar5: 9    Past Medical History:  Diagnosis Date  . Allergy   . Depression   . Tremors of nervous system    Essential Tremors (labetalol)    Past Surgical History:  Procedure Laterality Date  . TONSILLECTOMY  2010    Family History  Problem Relation Age of Onset  . Hyperlipidemia Mother   . Hypertension Mother   . Hyperthyroidism Mother   . Hyperlipidemia Father   . Cancer Maternal Grandmother 52       metastatic breast ca   . Heart attack Maternal Grandfather   . Heart disease Maternal Grandfather        AMI  . Cancer Paternal Grandmother        NOn-Hodgkins Lymphoma  . Diabetes Paternal Grandmother   . Heart disease Paternal Grandfather     Social History   Socioeconomic History  . Marital status: Married    Spouse name: Not on file  . Number of children: Not on file  . Years of education: Not  on file  . Highest education level: Not on file  Occupational History  . Not on file  Tobacco Use  . Smoking status: Former Smoker    Packs/day: 0.25    Types: Cigarettes    Quit date: 09/23/2015    Years since quitting: 5.1  . Smokeless tobacco: Never Used  Vaping Use  . Vaping Use: Never used  Substance and Sexual Activity  . Alcohol use: Yes    Comment: once a week 5oz  . Drug use: No  . Sexual activity: Yes    Birth control/protection: I.U.D.  Other Topics Concern  . Not on file  Social History Narrative   2 kids    Works 09/25/2015   Social Determinants of Health   Financial Resource Strain: Not on file  Food Insecurity: Not on file  Transportation Needs: Not on file  Physical Activity: Not on  file  Stress: Not on file  Social Connections: Not on file  Intimate Partner Violence: Not on file    Current Outpatient Medications on File Prior to Visit  Medication Sig Dispense Refill  . cetirizine (ZYRTEC) 10 MG tablet Take 10 mg by mouth daily.    . Cholecalciferol (VITAMIN D3 PO) Take 1 capsule by mouth daily.    . diphenhydrAMINE (BENADRYL) 25 MG tablet Take 25 mg by mouth every 6 (six) hours as needed.    . fexofenadine (ALLEGRA) 60 MG tablet Take 60 mg by mouth daily.     . fluticasone (FLONASE) 50 MCG/ACT nasal spray Place 1 spray into both nostrils daily.    Marland Kitchen levonorgestrel (MIRENA) 20 MCG/24HR IUD 1 each by Intrauterine route once.    . ondansetron (ZOFRAN ODT) 4 MG disintegrating tablet Take 1 tablet (4 mg total) by mouth every 8 (eight) hours as needed for nausea or vomiting. 20 tablet 0  . Semaglutide,0.25 or 0.5MG /DOS, 2 MG/1.5ML SOPN INJECT 0.25 MG INTO THE SKIN ONCE A WEEK. 1.5 mL 0  . vitamin C (ASCORBIC ACID) 500 MG tablet Take 500 mg by mouth 2 (two) times daily.    Marland Kitchen amoxicillin-clavulanate (AUGMENTIN) 875-125 MG tablet Take 1 tablet by mouth 2 (two) times daily. (Patient not taking: Reported on 11/04/2020) 14 tablet 0  . predniSONE (DELTASONE) 10 MG  tablet 6 tablets on Day 1 , then reduce by 1 tablet daily until gone (Patient not taking: Reported on 11/04/2020) 21 tablet 0  . [DISCONTINUED] phentermine (ADIPEX-P) 37.5 MG tablet Take 1 tablet (37.5 mg total) by mouth daily before breakfast. (Patient not taking: Reported on 05/12/2020) 30 tablet 0   No current facility-administered medications on file prior to visit.    No Known Allergies    Review of Systems Constitutional: negative for chills, fatigue, fevers and sweats Eyes: negative for irritation, redness and visual disturbance Ears, nose, mouth, throat, and face: negative for hearing loss, nasal congestion, snoring and tinnitus Respiratory: negative for asthma, cough, sputum Cardiovascular: negative for chest pain, dyspnea, exertional chest pressure/discomfort, irregular heart beat, palpitations and syncope Gastrointestinal: negative for abdominal pain, change in bowel habits, nausea and vomiting Genitourinary: negative for abnormal menstrual periods, genital lesions, sexual problems and vaginal discharge, dysuria and urinary incontinence Integument/breast: negative for breast lump, breast tenderness and nipple discharge Hematologic/lymphatic: negative for bleeding and easy bruising Musculoskeletal:negative for back pain and muscle weakness Neurological: negative for dizziness, headaches, vertigo and weakness Endocrine: negative for diabetic symptoms including polydipsia, polyuria and skin dryness Allergic/Immunologic: negative for hay fever and urticaria.     Objective:  Blood pressure 118/69, pulse 77, height 5\' 4"  (1.626 m), weight 178 lb 14.4 oz (81.1 kg), currently breastfeeding. Body mass index is 30.71 kg/m.   General Appearance:    Alert, cooperative, no distress, appears stated age, mild obesity  Head:    Normocephalic, without obvious abnormality, atraumatic  Eyes:    PERRL, conjunctiva/corneas clear, EOM's intact, both eyes  Ears:    Normal external ear canals,  both ears  Nose:   Nares normal, septum midline, mucosa normal, no drainage or sinus tenderness  Throat:   Lips, mucosa, and tongue normal; teeth and gums normal  Neck:   Supple, symmetrical, trachea midline, no adenopathy; thyroid: no enlargement/tenderness/nodules; no carotid bruit or JVD  Back:     Symmetric, no curvature, ROM normal, no CVA tenderness  Lungs:     Clear to auscultation bilaterally, respirations unlabored  Chest Wall:    No  tenderness or deformity   Heart:    Regular rate and rhythm, S1 and S2 normal, no murmur, rub or gallop  Breast Exam:    No tenderness, masses, or nipple abnormality  Abdomen:     Soft, non-tender, bowel sounds active all four quadrants, no masses, no organomegaly.    Genitalia:    Pelvic:external genitalia normal, vagina without lesions, discharge, or tenderness, rectovaginal septum  normal. Cervix normal in appearance, no cervical motion tenderness.  IUD threads visible, less than 0.5 cm in length. No adnexal masses or tenderness.  Uterus normal size, shape, mobile, regular contours, nontender.  Rectal:    Normal external sphincter.  No hemorrhoids appreciated. Internal exam not done.   Extremities:   Extremities normal, atraumatic, no cyanosis or edema  Pulses:   2+ and symmetric all extremities  Skin:   Skin color, texture, turgor normal, no rashes or lesions  Lymph nodes:   Cervical, supraclavicular, and axillary nodes normal  Neurologic:   CNII-XII intact, normal strength, sensation and reflexes throughout     Labs:  Lab Results  Component Value Date   WBC 6.1 05/12/2020   HGB 12.3 05/12/2020   HCT 37.5 05/12/2020   MCV 93.6 05/12/2020   PLT 224.0 05/12/2020    Lab Results  Component Value Date   CREATININE 0.82 05/13/2020   BUN 10 05/13/2020   NA 140 05/13/2020   K 3.8 05/13/2020   CL 105 05/13/2020   CO2 25 05/13/2020    Lab Results  Component Value Date   ALT 10 04/28/2020   AST 13 04/28/2020   ALKPHOS 44 04/28/2020    BILITOT 0.7 04/28/2020    Lab Results  Component Value Date   TSH 1.25 05/12/2020     Assessment:   Healthy female exam. IUD in place Mild obesity Fatigue   Plan:    - Blood tests: Vitamin B12 and Vitamin D for fatigue.  - Breast self exam technique reviewed and patient encouraged to perform self-exam monthly. - Contraception: Mirena IUD. Due for removal in 5023 (advised that they are now good for 7 years). IUD threads within cervical canal.  - Discussed healthy lifestyle modifications. - Pap smear up to date.   - COVID vaccination status up to date.  - RTC in 1 year for annual exam, or as needed.    Hildred Laser, MD [facility]

## 2020-11-05 LAB — VITAMIN B12: Vitamin B-12: 397 pg/mL (ref 232–1245)

## 2020-11-05 LAB — VITAMIN D 25 HYDROXY (VIT D DEFICIENCY, FRACTURES): Vit D, 25-Hydroxy: 30.4 ng/mL (ref 30.0–100.0)

## 2020-11-07 ENCOUNTER — Other Ambulatory Visit: Payer: Self-pay | Admitting: Internal Medicine

## 2020-11-09 ENCOUNTER — Other Ambulatory Visit: Payer: Self-pay

## 2020-11-09 MED ORDER — OZEMPIC (0.25 OR 0.5 MG/DOSE) 2 MG/1.5ML ~~LOC~~ SOPN
0.2500 mg | PEN_INJECTOR | SUBCUTANEOUS | 0 refills | Status: DC
  Filled 2020-11-09: qty 1.5, 56d supply, fill #0

## 2020-11-10 LAB — CYTOLOGY - PAP
Comment: NEGATIVE
Diagnosis: NEGATIVE
High risk HPV: NEGATIVE

## 2020-11-11 ENCOUNTER — Other Ambulatory Visit: Payer: Self-pay

## 2020-11-11 MED ORDER — OZEMPIC (0.25 OR 0.5 MG/DOSE) 2 MG/1.5ML ~~LOC~~ SOPN
0.5000 mg | PEN_INJECTOR | SUBCUTANEOUS | 3 refills | Status: DC
  Filled 2020-11-11: qty 4.5, 84d supply, fill #0
  Filled 2021-01-27 (×2): qty 4.5, 84d supply, fill #1

## 2020-11-11 NOTE — Telephone Encounter (Signed)
Sent in new rx per Dr Darrick Huntsman note and per patient request.

## 2020-12-16 ENCOUNTER — Other Ambulatory Visit: Payer: Self-pay

## 2020-12-16 ENCOUNTER — Other Ambulatory Visit (HOSPITAL_BASED_OUTPATIENT_CLINIC_OR_DEPARTMENT_OTHER): Payer: Self-pay

## 2020-12-16 ENCOUNTER — Other Ambulatory Visit (HOSPITAL_COMMUNITY): Payer: Self-pay

## 2020-12-16 MED ORDER — CARESTART COVID-19 HOME TEST VI KIT
PACK | 0 refills | Status: DC
  Filled 2020-12-16 (×2): qty 2, 4d supply, fill #0

## 2020-12-18 ENCOUNTER — Other Ambulatory Visit: Payer: Self-pay

## 2020-12-22 ENCOUNTER — Other Ambulatory Visit: Payer: Self-pay

## 2021-01-06 ENCOUNTER — Other Ambulatory Visit: Payer: Self-pay

## 2021-01-06 ENCOUNTER — Ambulatory Visit (INDEPENDENT_AMBULATORY_CARE_PROVIDER_SITE_OTHER): Payer: No Typology Code available for payment source | Admitting: Internal Medicine

## 2021-01-06 ENCOUNTER — Encounter: Payer: Self-pay | Admitting: Internal Medicine

## 2021-01-06 VITALS — BP 110/68 | HR 80 | Temp 98.1°F | Ht 64.02 in | Wt 168.2 lb

## 2021-01-06 DIAGNOSIS — E669 Obesity, unspecified: Secondary | ICD-10-CM

## 2021-01-06 DIAGNOSIS — R5383 Other fatigue: Secondary | ICD-10-CM

## 2021-01-06 DIAGNOSIS — T733XXS Exhaustion due to excessive exertion, sequela: Secondary | ICD-10-CM

## 2021-01-06 DIAGNOSIS — F411 Generalized anxiety disorder: Secondary | ICD-10-CM | POA: Diagnosis not present

## 2021-01-06 DIAGNOSIS — L739 Follicular disorder, unspecified: Secondary | ICD-10-CM | POA: Insufficient documentation

## 2021-01-06 LAB — COMPREHENSIVE METABOLIC PANEL
ALT: 9 U/L (ref 0–35)
AST: 12 U/L (ref 0–37)
Albumin: 4.5 g/dL (ref 3.5–5.2)
Alkaline Phosphatase: 40 U/L (ref 39–117)
BUN: 10 mg/dL (ref 6–23)
CO2: 28 mEq/L (ref 19–32)
Calcium: 9.2 mg/dL (ref 8.4–10.5)
Chloride: 106 mEq/L (ref 96–112)
Creatinine, Ser: 0.76 mg/dL (ref 0.40–1.20)
GFR: 99.4 mL/min (ref >60.00)
Glucose, Bld: 82 mg/dL (ref 70–99)
Potassium: 3.7 mEq/L (ref 3.5–5.1)
Sodium: 139 mEq/L (ref 135–145)
Total Bilirubin: 0.7 mg/dL (ref 0.2–1.2)
Total Protein: 6.9 g/dL (ref 6.0–8.3)

## 2021-01-06 MED ORDER — SULFAMETHOXAZOLE-TRIMETHOPRIM 800-160 MG PO TABS
1.0000 | ORAL_TABLET | Freq: Two times a day (BID) | ORAL | 0 refills | Status: DC
  Filled 2021-01-06: qty 14, 7d supply, fill #0

## 2021-01-06 MED ORDER — ALPRAZOLAM 0.25 MG PO TABS
0.2500 mg | ORAL_TABLET | Freq: Every evening | ORAL | 0 refills | Status: DC | PRN
  Filled 2021-01-06: qty 20, 20d supply, fill #0

## 2021-01-06 MED ORDER — SERTRALINE HCL 50 MG PO TABS
50.0000 mg | ORAL_TABLET | Freq: Every day | ORAL | 3 refills | Status: DC
  Filled 2021-01-06: qty 90, 90d supply, fill #0
  Filled 2021-04-07: qty 90, 90d supply, fill #1
  Filled 2021-07-23: qty 90, 90d supply, fill #2
  Filled 2021-11-02: qty 90, 90d supply, fill #3
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
## 2021-01-06 NOTE — Patient Instructions (Addendum)
1) Folliculitis ,  not lymph nodes.  From shaving  Septra DS twice daily  for one week 1 minute of Antibacterial soap  on  areas to be shaved.  (Dial, Hibiclens if recurrent)    2) Anxiety with insomnia  Alprazolam as needed for insomnia ONLY (IF YOU ARE STILL PUMPING/BREAST FEEDING,  PUMP BEFORE YOU TAKE YOUR TABLET)  ZOLOFT:  START WITH 1/2 TABLET FOR THE FIRST 6 DAYS TO AVOID NAUSEA  Advance to 50 g daily and follow up via mychart after 2 weeks of 50 mg dosing   3) Weight loss:  FANTASTIC!

## 2021-01-09 ENCOUNTER — Encounter: Payer: Self-pay | Admitting: Internal Medicine

## 2021-01-09 DIAGNOSIS — F411 Generalized anxiety disorder: Secondary | ICD-10-CM | POA: Insufficient documentation

## 2021-01-09 NOTE — Assessment & Plan Note (Addendum)
Significant success with trial of ozempic;  Advised to continue 0.5 mg dose until weight plateaus for several weeks , then increase dose if BMI of 25 has not ben reached yet   Lab Results  Component Value Date   ALT 9 01/06/2021   AST 12 01/06/2021   ALKPHOS 40 01/06/2021   BILITOT 0.7 01/06/2021   Lab Results  Component Value Date   TSH 1.25 05/12/2020

## 2021-01-09 NOTE — Assessment & Plan Note (Signed)
Reassured that the lumps she is feeling are too superficial to be LN's.  She has several plugged sebaceous cysts.  Empiric abx and counselling given
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
## 2021-01-27 ENCOUNTER — Other Ambulatory Visit: Payer: Self-pay

## 2021-02-03 ENCOUNTER — Other Ambulatory Visit: Payer: Self-pay

## 2021-02-03 MED ORDER — CYCLOBENZAPRINE HCL 5 MG PO TABS
ORAL_TABLET | ORAL | 0 refills | Status: DC
  Filled 2021-02-03: qty 21, 7d supply, fill #0

## 2021-02-03 MED ORDER — METHYLPREDNISOLONE 4 MG PO TBPK
ORAL_TABLET | ORAL | 0 refills | Status: DC
  Filled 2021-02-03: qty 21, 6d supply, fill #0

## 2021-02-08 ENCOUNTER — Other Ambulatory Visit: Payer: Self-pay

## 2021-02-08 ENCOUNTER — Encounter: Payer: Self-pay | Admitting: Internal Medicine

## 2021-02-08 ENCOUNTER — Telehealth (INDEPENDENT_AMBULATORY_CARE_PROVIDER_SITE_OTHER): Payer: No Typology Code available for payment source | Admitting: Internal Medicine

## 2021-02-08 DIAGNOSIS — F5105 Insomnia due to other mental disorder: Secondary | ICD-10-CM

## 2021-02-08 DIAGNOSIS — F411 Generalized anxiety disorder: Secondary | ICD-10-CM

## 2021-02-08 DIAGNOSIS — L739 Follicular disorder, unspecified: Secondary | ICD-10-CM

## 2021-02-08 DIAGNOSIS — F409 Phobic anxiety disorder, unspecified: Secondary | ICD-10-CM

## 2021-02-08 DIAGNOSIS — E669 Obesity, unspecified: Secondary | ICD-10-CM

## 2021-02-08 DIAGNOSIS — S0300XS Dislocation of jaw, unspecified side, sequela: Secondary | ICD-10-CM

## 2021-02-08 DIAGNOSIS — S0300XD Dislocation of jaw, unspecified side, subsequent encounter: Secondary | ICD-10-CM

## 2021-02-08 MED ORDER — ALPRAZOLAM 0.25 MG PO TABS
0.2500 mg | ORAL_TABLET | Freq: Every evening | ORAL | 5 refills | Status: DC | PRN
  Filled 2021-02-08: qty 30, 30d supply, fill #0
  Filled 2021-03-29 – 2021-03-30 (×2): qty 30, 30d supply, fill #1
  Filled 2021-05-02: qty 30, 30d supply, fill #2
  Filled 2021-06-23: qty 30, 30d supply, fill #3
  Filled 2021-08-03: qty 30, 30d supply, fill #4

## 2021-02-08 MED ORDER — TRAZODONE HCL 50 MG PO TABS
ORAL_TABLET | ORAL | 0 refills | Status: DC
  Filled 2021-02-08: qty 90, 90d supply, fill #0

## 2021-02-08 NOTE — Assessment & Plan Note (Signed)
Improved with use of sertraline 50 mg daily.  Adding trazodone for insomnia to mitigate risk of daily alprazolam use.

## 2021-02-08 NOTE — Assessment & Plan Note (Signed)
Left side of jaw  Treated by dentist with flexeril and steroids. .  Dietary advice Inge Rise given

## 2021-02-08 NOTE — Assessment & Plan Note (Signed)
She has lost 24 lbs since April using semaglutide and is tolerating the 0.5 mg dose without weight plateau.  Encouraged her to contineu working on lifestyle changes (exercise regularly)

## 2021-02-08 NOTE — Assessment & Plan Note (Signed)
Resolved with  Empiric abx.  Reviewed use of of razors,  Antibacterial soaps

## 2021-02-08 NOTE — Assessment & Plan Note (Signed)
Adding trazodone starting at 25 mg qhs.

## 2021-02-08 NOTE — Progress Notes (Signed)
Virtual Visit via Caregility Note  This visit type was conducted due to national recommendations for restrictions regarding the COVID-19 pandemic (e.g. social distancing).  This format is felt to be most appropriate for this patient at this time.  All issues noted in this document were discussed and addressed.  No physical exam was performed (except for noted visual exam findings with Video Visits).   I connected withNAME@ on 02/08/21 at  4:30 PM EDT by a video enabled telemedicine application  and verified that I am speaking with the correct person using two identifiers. Location patient: home Location provider: work or home office Persons participating in the virtual visit: patient, provider  I discussed the limitations, risks, security and privacy concerns of performing an evaluation and management service by telephone and the availability of in person appointments. I also discussed with the patient that there may be a patient responsible charge related to this service. The patient expressed understanding and agreed to proceed.   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  Current weight is 161 lbs,  down 7 since June 29.  Currently taking 0.5 mg weekly and weight has not plateaued.  Rare nausea ,  self limiting some worsening of constipation despite use of colace 100 mg daily . Goal weight is 150 lbs.    2) GAD/depression:  feeling much better with zoloft 50 mg daily .  Using alprazolam 0.25 mg prn insomnia,  sleeping much better on the nights she uses it (before her shift).  Concerned about daily use.   3) Folliculitis:  resolved.  4) Left sided jaw pain , recent episode .  Treated by dentist for TMJ with steroid and flexeril with good results.      ROS: See pertinent positives and negatives  per HPI.  Past Medical History:  Diagnosis Date   Allergy    Depression    Tremors of nervous system    Essential Tremors (labetalol)    Past Surgical History:  Procedure Laterality Date   TONSILLECTOMY  2010    Family History  Problem Relation Age of Onset   Hyperlipidemia Mother    Hypertension Mother    Hyperthyroidism Mother    Hyperlipidemia Father    Cancer Maternal Grandmother 76       metastatic breast ca    Heart attack Maternal Grandfather    Heart disease Maternal Grandfather        AMI   Cancer Paternal Grandmother        NOn-Hodgkins Lymphoma   Diabetes Paternal Grandmother    Heart disease Paternal Grandfather     SOCIAL HX:  reports that she quit smoking about 5 years ago. Her smoking use included cigarettes. She smoked an average of .25 packs per day. She has never used smokeless tobacco. She reports current alcohol use. She reports that she does not use drugs.    Current Outpatient Medications:    ALPRAZolam (XANAX) 0.25 MG tablet, Take 1 tablet (0.25 mg total) by mouth at bedtime as needed for anxiety or sleep., Disp: 20 tablet, Rfl: 0   Cholecalciferol (VITAMIN D3 PO), Take 1 capsule by mouth daily., Disp: , Rfl:    cyclobenzaprine (FLEXERIL) 5 MG tablet, Take 1 to 2 tablets by mouth 3 times a day as needed. Do not take  more than 3 weeks, Disp: 21 tablet, Rfl: 0   diphenhydrAMINE (BENADRYL) 25 MG tablet, Take 25 mg by mouth every 6 (six) hours as needed., Disp: , Rfl:    fexofenadine (ALLEGRA) 60 MG tablet, Take 60 mg by mouth daily. , Disp: , Rfl:    levonorgestrel (MIRENA) 20 MCG/24HR IUD, 1 each by Intrauterine route once., Disp: , Rfl:    ondansetron (ZOFRAN ODT) 4 MG disintegrating tablet, Take 1 tablet (4 mg total) by mouth every 8 (eight) hours as needed for nausea or vomiting., Disp: 20 tablet, Rfl: 0   Semaglutide,0.25 or 0.5MG /DOS, (OZEMPIC, 0.25 OR 0.5 MG/DOSE,) 2 MG/1.5ML SOPN, Inject 0.5 mg into the skin once a week., Disp: 4.5 mL, Rfl: 3    sertraline (ZOLOFT) 50 MG tablet, Take 1 tablet (50 mg total) by mouth daily., Disp: 90 tablet, Rfl: 3   traZODone (DESYREL) 50 MG tablet, 1/2 tablet 30 minutes before  bedtime.  Increase as needed , max dose 100 mg, Disp: 90 tablet, Rfl: 0   vitamin C (ASCORBIC ACID) 500 MG tablet, Take 500 mg by mouth 2 (two) times daily., Disp: , Rfl:    cetirizine (ZYRTEC) 10 MG tablet, Take 10 mg by mouth daily. (Patient not taking: No sig reported), Disp: , Rfl:    fluticasone (FLONASE) 50 MCG/ACT nasal spray, Place 1 spray into both nostrils daily. (Patient not taking: Reported on 02/08/2021), Disp: , Rfl:   EXAM:  VITALS per patient if applicable:  GENERAL: alert, oriented, appears well and in no acute distress  HEENT: atraumatic, conjunttiva clear, no obvious abnormalities on inspection of external nose and ears  NECK: normal movements of the head and neck  LUNGS: on inspection no signs of respiratory distress, breathing rate appears normal, no obvious gross SOB, gasping or wheezing  CV: no obvious cyanosis  MS: moves all visible extremities without noticeable abnormality  PSYCH/NEURO: pleasant and cooperative, no obvious depression or anxiety, speech and thought processing grossly intact  ASSESSMENT AND PLAN:  Discussed the following assessment and plan:  Anxiety state  Insomnia due to anxiety and fear  Folliculitis of left axilla  Obesity (BMI 30.0-34.9)  TMJ (dislocation of temporomandibular joint), sequela  Anxiety state Improved with use of sertraline 50 mg daily.  Adding trazodone for insomnia to mitigate risk of daily alprazolam use.   Insomnia due to anxiety and fear Adding trazodone starting at 25 mg qhs.  Folliculitis of left axilla Resolved with  Empiric abx.  Reviewed use of of razors,  Antibacterial soaps   Obesity (BMI 30.0-34.9) She has lost 24 lbs since April using semaglutide and is tolerating the 0.5 mg dose without weight plateau.  Encouraged her to contineu  working on lifestyle changes (exercise regularly)   TMJ (dislocation of temporomandibular joint), sequela Left side of jaw  Treated by dentist with flexeril and steroids. .  Dietary advice Inge Rise given     I discussed the assessment and treatment plan with the patient. The patient was provided an opportunity to ask questions and all were answered. The patient agreed with the plan and demonstrated an understanding of the instructions.   The patient was advised to call back or seek an in-person evaluation if the symptoms worsen or if the condition fails to improve as anticipated.   I spent 30 minutes dedicated to the care of this patient on the date of this encounter to include pre-visit review of her medical history,  Face-to-face time with the patient , and counselling on managementof insomnia/anxiety ,  prevention of weight regain,  recurrence of TMJ and folliculitis post visit ordering of testing and therapeutics.    Sherlene Shams, MD

## 2021-02-09 ENCOUNTER — Other Ambulatory Visit: Payer: Self-pay

## 2021-03-17 MED ORDER — OZEMPIC (1 MG/DOSE) 4 MG/3ML ~~LOC~~ SOPN
1.0000 mg | PEN_INJECTOR | SUBCUTANEOUS | 2 refills | Status: DC
  Filled 2021-03-17: qty 3, 28d supply, fill #0

## 2021-03-18 ENCOUNTER — Other Ambulatory Visit: Payer: Self-pay

## 2021-03-30 ENCOUNTER — Other Ambulatory Visit: Payer: Self-pay

## 2021-04-07 ENCOUNTER — Other Ambulatory Visit: Payer: Self-pay

## 2021-04-19 ENCOUNTER — Encounter: Payer: Self-pay | Admitting: Internal Medicine

## 2021-04-19 ENCOUNTER — Telehealth (INDEPENDENT_AMBULATORY_CARE_PROVIDER_SITE_OTHER): Payer: No Typology Code available for payment source | Admitting: Internal Medicine

## 2021-04-19 ENCOUNTER — Other Ambulatory Visit: Payer: Self-pay

## 2021-04-19 DIAGNOSIS — E663 Overweight: Secondary | ICD-10-CM

## 2021-04-19 DIAGNOSIS — U071 COVID-19: Secondary | ICD-10-CM | POA: Diagnosis not present

## 2021-04-19 MED ORDER — OZEMPIC (1 MG/DOSE) 4 MG/3ML ~~LOC~~ SOPN
1.0000 mg | PEN_INJECTOR | SUBCUTANEOUS | 2 refills | Status: DC
  Filled 2021-04-19: qty 3, 28d supply, fill #0

## 2021-04-19 MED ORDER — GUAIFENESIN-CODEINE 100-10 MG/5ML PO SOLN
5.0000 mL | Freq: Three times a day (TID) | ORAL | 0 refills | Status: DC | PRN
  Filled 2021-04-19: qty 84, 6d supply, fill #0
  Filled 2021-04-29 – 2021-05-04 (×2): qty 36, 3d supply, fill #1

## 2021-04-19 MED ORDER — BENZONATATE 200 MG PO CAPS
200.0000 mg | ORAL_CAPSULE | Freq: Two times a day (BID) | ORAL | 0 refills | Status: DC | PRN
  Filled 2021-04-19: qty 20, 10d supply, fill #0

## 2021-04-19 NOTE — Assessment & Plan Note (Signed)
Diagnosed Thursday Oct 6 with home test..  Symptoms mild.  Treatment for cough and sinus congestion outlined.  [facility] following

## 2021-04-19 NOTE — Patient Instructions (Addendum)
Congrats on the weigh loss!  Sorry you are under the weahter with COVID     Tessalon perles for daytime cough  cheratusdin for nighttime (contains codeine) or cough not relieved with tessalon   Sudafed pe for congestion ,  avoid prolonged sinus congestion ans it will lead to sinusitis   Ozempic refilled

## 2021-04-19 NOTE — Assessment & Plan Note (Addendum)
She has lost 31 lbs since April using semaglutide and is tolerating the 1.0 mg dose without weight plateau.  Her  goal is 150 bs (currently at 154) Encouraged her to continue working on lifestyle changes, as she has not incorporated  regular participation in exercise yet .  May need to reduce dose to 0.5 mg if weight drops too low.

## 2021-04-19 NOTE — Progress Notes (Signed)
Virtual Visit via Caregilty Note  This visit type was conducted due to national recommendations for restrictions regarding the COVID-19 pandemic (e.g. social distancing).  This format is felt to be most appropriate for this patient at this time.  All issues noted in this document were discussed and addressed.  No physical exam was performed (except for noted visual exam findings with Video Visits).   I connected withNAME@ on 04/19/21 at  8:30 AM EDT by a video enabled telemedicine application and verified that I am speaking with the correct person using two identifiers. Location patient: home Location provider: work or home office Persons participating in the virtual visit: patient, provider  I discussed the limitations, risks, security and privacy concerns of performing an evaluation and management service by telephone and the availability of in person appointments. I also discussed with the patient that there may be a patient responsible charge related to this service. The patient expressed understanding and agreed to proceed.   Reason for visit: weight management follow up   HPI:  40 yr old female  presents for weight management follow up.  Visit converted to virtual due to development of COVID 19 infection on Thursday Oct 6 with home test positive.  Body aches,  no fever ,  sinus drainage and congestion,  all improving but cough has remained problematic .  40 yr old sone developed headache yesterdat and tested positive as well.   2) Histor yof obesity:  has been taking semaglutide since April ,  currently on 1 mg dose.  Down 31 lbs thus far and still trending down .  Not exercising regularly yet     ROS: See pertinent positives and negatives per HPI.  Past Medical History:  Diagnosis Date   Allergy    Depression    Tremors of nervous system    Essential Tremors (labetalol)    Past Surgical History:  Procedure Laterality Date   TONSILLECTOMY  2010    Family History  Problem  Relation Age of Onset   Hyperlipidemia Mother    Hypertension Mother    Hyperthyroidism Mother    Hyperlipidemia Father    Cancer Maternal Grandmother 2       metastatic breast ca    Heart attack Maternal Grandfather    Heart disease Maternal Grandfather        AMI   Cancer Paternal Grandmother        NOn-Hodgkins Lymphoma   Diabetes Paternal Grandmother    Heart disease Paternal Grandfather     SOCIAL HX:  reports that she quit smoking about 5 years ago. Her smoking use included cigarettes. She smoked an average of .25 packs per day. She has never used smokeless tobacco. She reports current alcohol use. She reports that she does not use drugs.    Current Outpatient Medications:    ALPRAZolam (XANAX) 0.25 MG tablet, Take 1 tablet (0.25 mg total) by mouth at bedtime as needed for anxiety or sleep., Disp: 30 tablet, Rfl: 5   benzonatate (TESSALON) 200 MG capsule, Take 1 capsule (200 mg total) by mouth 2 (two) times daily as needed for cough., Disp: 20 capsule, Rfl: 0   Cholecalciferol (VITAMIN D3 PO), Take 1 capsule by mouth daily., Disp: , Rfl:    diphenhydrAMINE (BENADRYL) 25 MG tablet, Take 25 mg by mouth every 6 (six) hours as needed., Disp: , Rfl:    fexofenadine (ALLEGRA) 60 MG tablet, Take 60 mg by mouth daily. , Disp: , Rfl:    fluticasone (FLONASE)  50 MCG/ACT nasal spray, Place 1 spray into both nostrils daily., Disp: , Rfl:    guaiFENesin-codeine (CHERATUSSIN AC) 100-10 MG/5ML syrup, Take 5 mLs by mouth 3 (three) times daily as needed for cough., Disp: 120 mL, Rfl: 0   levonorgestrel (MIRENA) 20 MCG/24HR IUD, 1 each by Intrauterine route once., Disp: , Rfl:    ondansetron (ZOFRAN ODT) 4 MG disintegrating tablet, Take 1 tablet (4 mg total) by mouth every 8 (eight) hours as needed for nausea or vomiting., Disp: 20 tablet, Rfl: 0   Semaglutide, 1 MG/DOSE, (OZEMPIC, 1 MG/DOSE,) 4 MG/3ML SOPN, Inject 1 mg into the skin once a week., Disp: 3 mL, Rfl: 2   sertraline (ZOLOFT) 50 MG  tablet, Take 1 tablet (50 mg total) by mouth daily., Disp: 90 tablet, Rfl: 3   traZODone (DESYREL) 50 MG tablet, 1/2 tablet 30 minutes before  bedtime.  Increase as needed , max dose 100 mg, Disp: 90 tablet, Rfl: 0   vitamin C (ASCORBIC ACID) 500 MG tablet, Take 500 mg by mouth 2 (two) times daily., Disp: , Rfl:   EXAM:  VITALS per patient if applicable:  GENERAL: alert, oriented, appears well and iappears mildly ill,   HEENT: atraumatic, conjunttiva clear, no obvious abnormalities on inspection of external nose and ears  NECK: normal movements of the head and neck  LUNGS: on inspection no signs of respiratory distress, breathing rate appears normal, no obvious gross SOB, gasping or wheezing. Conversation interrupted with coughs.   CV: no obvious cyanosis  MS: moves all visible extremities without noticeable abnormality  PSYCH/NEURO: pleasant and cooperative, no obvious depression or anxiety, speech and thought processing grossly intact  ASSESSMENT AND PLAN:  Discussed the following assessment and plan:  Overweight (BMI 25.0-29.9)  COVID-19 virus infection  Overweight (BMI 25.0-29.9) She has lost 31 lbs since April using semaglutide and is tolerating the 1.0 mg dose without weight plateau.  Her  goal is 150 bs (currently at 154) Encouraged her to continue working on lifestyle changes, as she has not incorporated  regular participation in exercise yet .  May need to reduce dose to 0.5 mg if weight drops too low.  COVID-19 virus infection Diagnosed Thursday Oct 6 with home test..  Symptoms mild.  Treatment for cough and sinus congestion outlined.  [facility] following     I discussed the assessment and treatment plan with the patient. The patient was provided an opportunity to ask questions and all were answered. The patient agreed with the plan and demonstrated an understanding of the instructions.   The patient was advised to call back or seek an in-person evaluation if the  symptoms worsen or if the condition fails to improve as anticipated.   I spent 20 minutes dedicated to the care of this patient on the date of this encounter to include pre-visit review of his medical history,  Face-to-face time with the patient , and post visit ordering of testing and therapeutics.    Sherlene Shams, MD

## 2021-04-29 ENCOUNTER — Other Ambulatory Visit: Payer: Self-pay | Admitting: Internal Medicine

## 2021-04-30 ENCOUNTER — Other Ambulatory Visit: Payer: Self-pay | Admitting: Internal Medicine

## 2021-04-30 ENCOUNTER — Other Ambulatory Visit: Payer: Self-pay

## 2021-04-30 MED FILL — Benzonatate Cap 200 MG: ORAL | 10 days supply | Qty: 20 | Fill #0 | Status: AC

## 2021-05-02 ENCOUNTER — Other Ambulatory Visit: Payer: Self-pay | Admitting: Internal Medicine

## 2021-05-03 ENCOUNTER — Other Ambulatory Visit: Payer: Self-pay

## 2021-05-03 DIAGNOSIS — R053 Chronic cough: Secondary | ICD-10-CM

## 2021-05-03 MED ORDER — TRAZODONE HCL 50 MG PO TABS
ORAL_TABLET | ORAL | 0 refills | Status: DC
  Filled 2021-05-03: qty 90, 45d supply, fill #0

## 2021-05-04 ENCOUNTER — Other Ambulatory Visit: Payer: Self-pay

## 2021-05-05 ENCOUNTER — Other Ambulatory Visit: Payer: Self-pay

## 2021-05-05 MED ORDER — GUAIFENESIN-CODEINE 100-10 MG/5ML PO SOLN
5.0000 mL | Freq: Three times a day (TID) | ORAL | 0 refills | Status: DC | PRN
  Filled 2021-05-05: qty 120, 8d supply, fill #0

## 2021-05-11 ENCOUNTER — Ambulatory Visit
Admission: RE | Admit: 2021-05-11 | Discharge: 2021-05-11 | Disposition: A | Discharge status: Home / Self Care | Payer: No Typology Code available for payment source | Source: Ambulatory Visit | Attending: Internal Medicine | Admitting: Internal Medicine

## 2021-05-11 ENCOUNTER — Ambulatory Visit
Admission: RE | Admit: 2021-05-11 | Discharge: 2021-05-11 | Disposition: A | Discharge status: Home / Self Care | Payer: No Typology Code available for payment source | Attending: Internal Medicine | Admitting: Internal Medicine

## 2021-05-11 DIAGNOSIS — U099 Post covid-19 condition, unspecified: Secondary | ICD-10-CM | POA: Insufficient documentation

## 2021-05-11 DIAGNOSIS — R053 Chronic cough: Secondary | ICD-10-CM

## 2021-05-25 ENCOUNTER — Other Ambulatory Visit: Payer: Self-pay | Admitting: Internal Medicine

## 2021-05-25 DIAGNOSIS — E663 Overweight: Secondary | ICD-10-CM

## 2021-05-25 MED ORDER — OZEMPIC (0.25 OR 0.5 MG/DOSE) 2 MG/1.5ML ~~LOC~~ SOPN
0.5000 mg | PEN_INJECTOR | SUBCUTANEOUS | 5 refills | Status: DC
  Filled 2021-05-25: qty 4.5, 84d supply, fill #0
  Filled 2021-10-03: qty 4.5, 84d supply, fill #1

## 2021-05-25 NOTE — Assessment & Plan Note (Signed)
Weight reduced to 148 lbs.  Reducing dose of Ozempic to 0.5 mg weekly

## 2021-05-26 ENCOUNTER — Other Ambulatory Visit: Payer: Self-pay

## 2021-05-28 ENCOUNTER — Other Ambulatory Visit: Payer: Self-pay

## 2021-06-07 ENCOUNTER — Encounter: Payer: Self-pay | Admitting: Internal Medicine

## 2021-06-08 ENCOUNTER — Telehealth: Payer: Self-pay

## 2021-06-08 NOTE — Telephone Encounter (Signed)
LMTCB

## 2021-06-08 NOTE — Telephone Encounter (Signed)
Pt is returning call. Pt is requesting callback at [phone]

## 2021-06-08 NOTE — Telephone Encounter (Signed)
Mycahrt message   Pt is returning call. Pt is requesting callback at [phone]

## 2021-06-16 ENCOUNTER — Other Ambulatory Visit: Payer: Self-pay

## 2021-06-16 ENCOUNTER — Telehealth: Payer: No Typology Code available for payment source | Admitting: Nurse Practitioner

## 2021-06-16 DIAGNOSIS — H5789 Other specified disorders of eye and adnexa: Secondary | ICD-10-CM | POA: Diagnosis not present

## 2021-06-16 MED ORDER — POLYMYXIN B-TRIMETHOPRIM 10000-0.1 UNIT/ML-% OP SOLN
1.0000 [drp] | OPHTHALMIC | 0 refills | Status: DC
  Filled 2021-06-16: qty 10, 12d supply, fill #0

## 2021-06-16 NOTE — Progress Notes (Signed)

## 2021-06-24 ENCOUNTER — Other Ambulatory Visit: Payer: Self-pay

## 2021-06-28 ENCOUNTER — Other Ambulatory Visit: Payer: Self-pay

## 2021-07-06 ENCOUNTER — Encounter: Payer: Self-pay | Admitting: Internal Medicine

## 2021-07-23 ENCOUNTER — Other Ambulatory Visit: Payer: Self-pay

## 2021-08-04 ENCOUNTER — Other Ambulatory Visit: Payer: Self-pay

## 2021-08-26 ENCOUNTER — Other Ambulatory Visit: Payer: Self-pay | Admitting: Internal Medicine

## 2021-08-27 ENCOUNTER — Other Ambulatory Visit: Payer: Self-pay

## 2021-08-27 MED ORDER — ALPRAZOLAM 0.25 MG PO TABS
0.2500 mg | ORAL_TABLET | Freq: Every evening | ORAL | 1 refills | Status: DC | PRN
Start: 2021-08-27 — End: 2022-06-15
  Filled 2021-08-27 – 2021-09-10 (×2): qty 30, 30d supply, fill #0
  Filled 2022-01-08: qty 30, 30d supply, fill #1

## 2021-08-27 MED ORDER — TRAZODONE HCL 50 MG PO TABS
ORAL_TABLET | ORAL | 0 refills | Status: DC
  Filled 2021-08-27: qty 90, 45d supply, fill #0

## 2021-09-01 ENCOUNTER — Other Ambulatory Visit: Payer: Self-pay

## 2021-09-10 ENCOUNTER — Other Ambulatory Visit: Payer: Self-pay

## 2021-09-20 ENCOUNTER — Encounter: Payer: Self-pay | Admitting: Obstetrics and Gynecology

## 2021-09-28 ENCOUNTER — Other Ambulatory Visit: Payer: Self-pay

## 2021-10-04 ENCOUNTER — Other Ambulatory Visit: Payer: Self-pay

## 2021-10-08 ENCOUNTER — Ambulatory Visit (INDEPENDENT_AMBULATORY_CARE_PROVIDER_SITE_OTHER): Payer: No Typology Code available for payment source | Admitting: Obstetrics and Gynecology

## 2021-10-08 ENCOUNTER — Encounter: Payer: Self-pay | Admitting: Obstetrics and Gynecology

## 2021-10-08 VITALS — BP 101/60 | HR 86 | Resp 16 | Ht 64.0 in | Wt 155.4 lb

## 2021-10-08 DIAGNOSIS — Z30432 Encounter for removal of intrauterine contraceptive device: Secondary | ICD-10-CM | POA: Diagnosis not present

## 2021-10-08 DIAGNOSIS — Z30017 Encounter for initial prescription of implantable subdermal contraceptive: Secondary | ICD-10-CM | POA: Diagnosis not present

## 2021-10-08 DIAGNOSIS — R102 Pelvic and perineal pain: Secondary | ICD-10-CM | POA: Diagnosis not present

## 2021-10-08 NOTE — Progress Notes (Signed)
? ? ?  GYNECOLOGY PROGRESS NOTE ? ?Subjective:  ? ? Patient ID: Brianna Washington, female    DOB: [birth year], 41 y.o.   MRN: [id number] ? ?HPI ? Patient is a 41 y.o. G3P2001 female who presents for IUD removal and birth control consultation.  She notes that she is having breakthrough bleeding with her IUD and pelvic pain.  IUD has been in place since 01/2017. Is thinking about switching to the Nexplanon.  ? ?The following portions of the patient's history were reviewed and updated as appropriate: allergies, current medications, past family history, past medical history, past social history, past surgical history, and problem list. ? ?Review of Systems ?Pertinent items noted in HPI and remainder of comprehensive ROS otherwise negative.  ? ?Objective:  ? Blood pressure 101/60, pulse 86, resp. rate 16, height 5\' 4"  (1.626 m), weight 155 lb 6.4 oz (70.5 kg), currently breastfeeding. Body mass index is 26.67 kg/m?. ?General appearance: alert and no distress ?Abdomen: soft, non-tender; bowel sounds normal; no masses,  no organomegaly ?Pelvic: external genitalia normal, rectovaginal septum normal.  Vagina with scant dark brown blood.  Cervix normal appearing, no lesions and no motion tenderness. IUD threads not visible.   Uterus mobile, nontender, normal shape and size.  Adnexae non-palpable, nontender bilaterally.  ?Extremities: extremities normal, atraumatic, no cyanosis or edema ?Neurologic: Grossly normal ? ? ?Assessment:  ? ?1. Encounter for IUD removal   ?2. Nexplanon insertion   ?  ? ?Plan:  ? ?Discussed contraceptive options, patient notes she is confident that she would like to switch to the Nexplanon. IUD removed today and Nexplanon inserted (see insertion note below).  ?Return to clinic for any scheduled appointments or for any gynecologic concerns as needed.  ? ? ? ? ?IUD Removal  ?Patient identified, informed consent performed, consent signed.  Patient was in the dorsal lithotomy position, normal external  genitalia was noted.  A speculum was placed in the patient's vagina, normal discharge was noted, no lesions. The cervix was visualized, no lesions, no abnormal discharge.  The strings of the IUD were not visualized, so the cytobrush, IUD hook, and eventually tonsil forceps were introduced into the endometrial cavity and the IUD was grasped and removed in its entirety.  Patient tolerated the procedure well.   ? ? ?Nexplanon Insertion Procedure ?Patient identified, informed consent performed, consent signed.   Patient does understand that irregular bleeding is a very common side effect of this medication. She was advised to have backup contraception for one week after placement. No pregnancy test performed as patient had IUD in place.  Appropriate time out taken.  Patient's left arm was prepped and draped in the usual sterile fashion. The ruler used to measure and mark insertion area.  Patient was prepped with alcohol swab and then injected with 3 ml of 1% lidocaine.  She was prepped with betadine, Nexplanon removed from packaging,  Device confirmed in needle, then inserted full length of needle and withdrawn per handbook instructions. Nexplanon was able to palpated in the patient's arm; patient palpated the insert herself. There was minimal blood loss.  Patient insertion site covered with guaze and a pressure bandage to reduce any bruising.  The patient tolerated the procedure well and was given post procedure instructions.  ? ? ?Exp: 05/14/2023 ?Lot: 13/09/2022 ? ? ?[id number], MD ?[facility] ? ? ?

## 2021-10-08 NOTE — Patient Instructions (Signed)
NEXPLANON PLACEMENT POST-PROCEDURE INSTRUCTIONS  You may take Ibuprofen, Aleve or Tylenol for pain if needed.  Pain should resolve within in 24 hours.  You may have intercourse after 24 hours.  If you using this for birth control, it is effective immediately.  You need to call if you have any fever, heavy bleeding, or redness at insertion site. Irregular bleeding is common the first several months after having a Nexplanonplaced. You do not need to call for this reason unless you are concerned.  Shower or bathe as normal.  You can remove the bandage after 24 hours.  

## 2021-11-02 ENCOUNTER — Other Ambulatory Visit: Payer: Self-pay

## 2021-11-29 ENCOUNTER — Other Ambulatory Visit: Payer: Self-pay | Admitting: Internal Medicine

## 2021-11-30 ENCOUNTER — Other Ambulatory Visit: Payer: Self-pay

## 2021-11-30 ENCOUNTER — Other Ambulatory Visit: Payer: Self-pay | Admitting: Internal Medicine

## 2021-11-30 MED FILL — Trazodone HCl Tab 50 MG: ORAL | 45 days supply | Qty: 90 | Fill #0 | Status: AC

## 2022-01-08 ENCOUNTER — Other Ambulatory Visit: Payer: Self-pay | Admitting: Internal Medicine

## 2022-01-10 ENCOUNTER — Other Ambulatory Visit: Payer: Self-pay

## 2022-01-10 MED ORDER — TRAZODONE HCL 50 MG PO TABS
ORAL_TABLET | ORAL | 0 refills | Status: DC
Start: 2022-01-10 — End: 2022-05-31
  Filled 2022-01-10: qty 90, 90d supply, fill #0

## 2022-02-07 ENCOUNTER — Other Ambulatory Visit: Payer: Self-pay

## 2022-02-07 ENCOUNTER — Other Ambulatory Visit: Payer: Self-pay | Admitting: Internal Medicine

## 2022-02-07 MED ORDER — SERTRALINE HCL 50 MG PO TABS
50.0000 mg | ORAL_TABLET | Freq: Every day | ORAL | 0 refills | Status: DC
  Filled 2022-02-07: qty 90, 90d supply, fill #0

## 2022-04-12 ENCOUNTER — Encounter: Payer: Self-pay | Admitting: Internal Medicine

## 2022-05-05 ENCOUNTER — Ambulatory Visit: Payer: No Typology Code available for payment source | Admitting: Internal Medicine

## 2022-05-13 ENCOUNTER — Other Ambulatory Visit: Payer: Self-pay

## 2022-05-13 ENCOUNTER — Other Ambulatory Visit: Payer: Self-pay | Admitting: Internal Medicine

## 2022-05-16 ENCOUNTER — Other Ambulatory Visit: Payer: Self-pay

## 2022-05-16 MED FILL — Sertraline HCl Tab 50 MG: ORAL | 90 days supply | Qty: 90 | Fill #0 | Status: AC

## 2022-05-30 ENCOUNTER — Other Ambulatory Visit: Payer: Self-pay | Admitting: Obstetrics and Gynecology

## 2022-05-30 DIAGNOSIS — Z1231 Encounter for screening mammogram for malignant neoplasm of breast: Secondary | ICD-10-CM

## 2022-05-31 ENCOUNTER — Other Ambulatory Visit: Payer: Self-pay | Admitting: Internal Medicine

## 2022-06-01 ENCOUNTER — Other Ambulatory Visit: Payer: Self-pay

## 2022-06-01 MED ORDER — TRAZODONE HCL 50 MG PO TABS
ORAL_TABLET | ORAL | 1 refills | Status: DC
  Filled 2022-06-01: qty 90, 45d supply, fill #0
  Filled 2022-09-06: qty 90, 45d supply, fill #1

## 2022-06-08 ENCOUNTER — Other Ambulatory Visit: Payer: Self-pay | Admitting: Internal Medicine

## 2022-06-09 ENCOUNTER — Other Ambulatory Visit: Payer: Self-pay

## 2022-06-09 ENCOUNTER — Other Ambulatory Visit: Payer: Self-pay | Admitting: Internal Medicine

## 2022-06-12 ENCOUNTER — Other Ambulatory Visit: Payer: Self-pay

## 2022-06-12 NOTE — Telephone Encounter (Signed)
LOV: 04/19/21 (mychart video visit) NOV: 06/14/22

## 2022-06-13 ENCOUNTER — Other Ambulatory Visit: Payer: Self-pay

## 2022-06-14 ENCOUNTER — Ambulatory Visit (INDEPENDENT_AMBULATORY_CARE_PROVIDER_SITE_OTHER): Payer: No Typology Code available for payment source | Admitting: Internal Medicine

## 2022-06-14 ENCOUNTER — Other Ambulatory Visit: Payer: Self-pay

## 2022-06-14 ENCOUNTER — Encounter: Payer: Self-pay | Admitting: Internal Medicine

## 2022-06-14 VITALS — BP 100/74 | HR 70 | Temp 97.6°F | Ht 64.0 in | Wt 182.2 lb

## 2022-06-14 DIAGNOSIS — R5383 Other fatigue: Secondary | ICD-10-CM | POA: Diagnosis not present

## 2022-06-14 DIAGNOSIS — E559 Vitamin D deficiency, unspecified: Secondary | ICD-10-CM | POA: Diagnosis not present

## 2022-06-14 DIAGNOSIS — E663 Overweight: Secondary | ICD-10-CM | POA: Diagnosis not present

## 2022-06-14 DIAGNOSIS — G250 Essential tremor: Secondary | ICD-10-CM

## 2022-06-14 DIAGNOSIS — R7301 Impaired fasting glucose: Secondary | ICD-10-CM

## 2022-06-14 MED ORDER — TIRZEPATIDE 2.5 MG/0.5ML ~~LOC~~ SOAJ: 2.5000 mg | SUBCUTANEOUS | 2 refills | Status: DC

## 2022-06-14 MED ORDER — SCOPOLAMINE 1 MG/3DAYS TD PT72
1.0000 | MEDICATED_PATCH | TRANSDERMAL | 0 refills | Status: DC
  Filled 2022-06-14: qty 10, 30d supply, fill #0

## 2022-06-14 MED ORDER — ONDANSETRON HCL 4 MG PO TABS
4.0000 mg | ORAL_TABLET | Freq: Three times a day (TID) | ORAL | 0 refills | Status: DC | PRN
  Filled 2022-06-14: qty 20, 7d supply, fill #0

## 2022-06-14 MED ORDER — METOPROLOL SUCCINATE ER 25 MG PO TB24
25.0000 mg | ORAL_TABLET | Freq: Every day | ORAL | 1 refills | Status: DC
  Filled 2022-06-14: qty 90, 90d supply, fill #0

## 2022-06-14 MED ORDER — AZITHROMYCIN 500 MG PO TABS
500.0000 mg | ORAL_TABLET | Freq: Every day | ORAL | 0 refills | Status: DC
  Filled 2022-06-14: qty 7, 7d supply, fill #0

## 2022-06-14 MED ORDER — PREDNISONE 10 MG PO TABS
ORAL_TABLET | ORAL | 0 refills | Status: AC
  Filled 2022-06-14: qty 21, 6d supply, fill #0

## 2022-06-14 NOTE — Assessment & Plan Note (Addendum)
Weight reduced to 148 lbs. On 1.0 ozempic and maintained  on reduced dose of Ozempic to 0.5 mg weekly.  However she has had to stop using the medication due to lack of coverage, and has regianed > 30 lbs.  Samples of Mounjaro 2.5 mg dose given ,  3 months supply

## 2022-06-14 NOTE — Patient Instructions (Signed)
Reduce your caffeine dose  Start metoprolol xl 25 mg at bedtime for tremor  Travel pack for your cruise sent to pharmacy   Mounjaro dose starts at 2.5 mg weekly,  you can increase to 5 mg after 4 weeks if needed.    Call in January to see if we have more samples to give out

## 2022-06-14 NOTE — Assessment & Plan Note (Signed)
Trial of metoprolol XL and advised to reduce caffeine.  Checking Vitamin D level as well

## 2022-06-14 NOTE — Progress Notes (Signed)
Subjective:  Patient ID: Brianna Washington, female    DOB: February 21, 1981  Age: 41 y.o. MRN: [id number]  CC: The primary encounter diagnosis was Other fatigue. Diagnoses of Vitamin D deficiency, Impaired fasting glucose, Overweight (BMI 25.0-29.9), and Essential tremor were also pertinent to this visit.   HPI Brianna Washington presents for follow up on multiple issues Chief Complaint  Patient presents with   Tremors    hand   1) ET:  Patient's GF had essential tremors.  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  Has noticed increased tremor (intentional) of left hand especially when starting IV's.  Drinks one  3 caffeinated beverages daily ( coffee,  2 soda's) . Aggravated by fatigue and fasting state.   2) obesity:  lost 40 lbs with semaglutide which was started in April 2022 when weight reached 190 lbs reached a weight of 148 lbs  on the 1.0 mg dose,  lowered dose to 0.5 mg for mainteance,  then  regained 34 lbs when insurance stopped paying for it   She has trouble suppressing appetite without the medication .     Outpatient Medications Prior to Visit  Medication Sig Dispense Refill   ALPRAZolam (XANAX) 0.25 MG tablet Take 1 tablet (0.25 mg total) by mouth at bedtime as needed for anxiety or sleep. 30 tablet 1   Cholecalciferol (VITAMIN D3 PO) Take 1 capsule by mouth daily.     diphenhydrAMINE (BENADRYL) 25 MG tablet Take 25 mg by mouth every 6 (six) hours as needed.     fexofenadine (ALLEGRA) 60 MG tablet Take 60 mg by mouth daily.      Omega-3 Fatty Acids (FISH OIL) 1200 MG CAPS Take 1 capsule by mouth daily.     sertraline (ZOLOFT) 50 MG tablet Take 1 tablet (50 mg total) by mouth daily. 90 tablet 1   traZODone (DESYREL) 50 MG tablet 1/2 tablet 30 minutes before  bedtime.  Increase as needed , max dose 100 mg 90 tablet 1   Turmeric 400 MG CAPS Take 1 capsule by mouth daily.     vitamin C (ASCORBIC ACID) 500 MG tablet Take 500 mg by  mouth 2 (two) times daily.     fluticasone (FLONASE) 50 MCG/ACT nasal spray Place 1 spray into both nostrils daily.     ondansetron (ZOFRAN ODT) 4 MG disintegrating tablet Take 1 tablet (4 mg total) by mouth every 8 (eight) hours as needed for nausea or vomiting. (Patient not taking: Reported on 06/14/2022) 20 tablet 0   phentermine (ADIPEX-P) 37.5 MG tablet Take 1 tablet (37.5 mg total) by mouth daily before breakfast. (Patient not taking: Reported on 05/12/2020) 30 tablet 0   Semaglutide,0.25 or 0.5MG /DOS, (OZEMPIC, 0.25 OR 0.5 MG/DOSE,) 2 MG/1.5ML SOPN Inject 0.5 mg into the skin once a week. 1.5 mL 5   No facility-administered medications prior to visit.    Review of Systems;  Patient denies headache, fevers, malaise, unintentional weight loss, skin rash, eye pain, sinus congestion and sinus pain, sore throat, dysphagia,  hemoptysis , cough, dyspnea, wheezing, chest pain, palpitations, orthopnea, edema, abdominal pain, nausea, melena, diarrhea, constipation, flank pain, dysuria, hematuria, urinary  Frequency, nocturia, numbness, tingling, seizures,  Focal weakness, Loss of consciousness,  insomnia, depression, anxiety, and suicidal ideation.      Objective:  BP 100/74   Pulse 70   Temp 97.6 F (36.4 C) (Oral)   Ht 5\' 4"  (1.626 m)   Wt  182 lb 3.2 oz (82.6 kg)   SpO2 98%   BMI 31.27 kg/m   BP Readings from Last 3 Encounters:  06/14/22 100/74  10/08/21 101/60  01/06/21 110/68    Wt Readings from Last 3 Encounters:  06/14/22 182 lb 3.2 oz (82.6 kg)  10/08/21 155 lb 6.4 oz (70.5 kg)  04/19/21 154 lb (69.9 kg)    General appearance: alert, cooperative and appears stated age Ears: normal TM's and external ear canals both ears Throat: lips, mucosa, and tongue normal; teeth and gums normal Neck: no adenopathy, no carotid bruit, supple, symmetrical, trachea midline and thyroid not enlarged, symmetric, no tenderness/mass/nodules Back: symmetric, no curvature. ROM normal. No CVA  tenderness. Lungs: clear to auscultation bilaterally Heart: regular rate and rhythm, S1, S2 normal, no murmur, click, rub or gallop Abdomen: soft, non-tender; bowel sounds normal; no masses,  no organomegaly Pulses: 2+ and symmetric Skin: Skin color, texture, turgor normal. No rashes or lesions Lymph nodes: Cervical, supraclavicular, and axillary nodes normal. Neuro:  awake and interactive with normal mood and affect. Higher cortical functions are normal. Speech is clear without word-finding difficulty or dysarthria. Extraocular movements are intact. Visual fields of both eyes are grossly intact. Sensation to light touch is grossly intact bilaterally of upper and lower extremities. Motor examination shows 4+/5 symmetric hand grip and upper extremity and 5/5 lower extremity strength. There is no pronation or drift. Gait is non-ataxic   Lab Results  Component Value Date   HGBA1C 4.9 05/09/2016    Lab Results  Component Value Date   CREATININE 0.76 01/06/2021   CREATININE 0.82 05/13/2020   CREATININE 0.79 04/28/2020    Lab Results  Component Value Date   WBC 6.1 05/12/2020   HGB 12.3 05/12/2020   HCT 37.5 05/12/2020   PLT 224.0 05/12/2020   GLUCOSE 82 01/06/2021   CHOL 147 10/30/2019   TRIG 70 10/30/2019   HDL 47 10/30/2019   LDLDIRECT 147.5 07/26/2013   LDLCALC 86 10/30/2019   ALT 9 01/06/2021   AST 12 01/06/2021   NA 139 01/06/2021   K 3.7 01/06/2021   CL 106 01/06/2021   CREATININE 0.76 01/06/2021   BUN 10 01/06/2021   CO2 28 01/06/2021   TSH 1.25 05/12/2020   HGBA1C 4.9 05/09/2016    DG Chest 2 View  Result Date: 05/12/2021 CLINICAL DATA:  Cough.  Post COVID. EXAM: CHEST - 2 VIEW COMPARISON:  No prior. FINDINGS: Mediastinum and hilar structures normal. Tiny area of subsegmental atelectasis noted over the left upper lobe posteriorly. Lungs are clear of infiltrates. No pleural effusion or pneumothorax. Tiny calcified nodular density noted the right apex consistent with a  small calcified granuloma. No pleural effusion or pneumothorax. Degenerative change thoracic spine. IMPRESSION: No acute cardiopulmonary disease identified. Electronically Signed   By: Maisie Fus  Register M.D.   On: 05/12/2021 07:07    Assessment & Plan:   Problem List Items Addressed This Visit     Essential tremor    Trial of metoprolol XL and advised to reduce caffeine.  Checking Vitamin D level as well       Fatigue - Primary   Relevant Orders   TSH   CBC with Differential/Platelet   Comprehensive metabolic panel   X44 and Folate Panel   Overweight (BMI 25.0-29.9)    Weight reduced to 148 lbs. On 1.0 ozempic and maintained  on reduced dose of Ozempic to 0.5 mg weekly.  However she has had to stop using the medication due to  lack of coverage, and has regianed > 30 lbs.  Samples of Mounjaro 2.5 mg dose given ,  3 months supply      Other Visit Diagnoses     Vitamin D deficiency       Relevant Orders   VITAMIN D 25 Hydroxy (Vit-D Deficiency, Fractures)   Impaired fasting glucose       Relevant Orders   Hemoglobin A1c       I spent a total of 30 minutes with this patient in a face to face visit on the date of this encounter reviewing the last office visit with me ,   most recent visit with gynecology,  patient's diet and exercise habits, recent  labs and imaging studies, and post visit ordering of testing and therapeutics.    Follow-up: Return in about 6 months (around 12/14/2022).   Sherlene Shams, MD

## 2022-06-15 ENCOUNTER — Other Ambulatory Visit: Payer: Self-pay | Admitting: Internal Medicine

## 2022-06-15 ENCOUNTER — Other Ambulatory Visit: Payer: Self-pay

## 2022-06-15 LAB — COMPREHENSIVE METABOLIC PANEL
ALT: 16 U/L (ref 0–35)
AST: 17 U/L (ref 0–37)
Albumin: 4.4 g/dL (ref 3.5–5.2)
Alkaline Phosphatase: 45 U/L (ref 39–117)
BUN: 12 mg/dL (ref 6–23)
CO2: 28 mEq/L (ref 19–32)
Calcium: 8.8 mg/dL (ref 8.4–10.5)
Chloride: 105 mEq/L (ref 96–112)
Creatinine, Ser: 0.76 mg/dL (ref 0.40–1.20)
GFR: 98.4 mL/min (ref >60.00)
Glucose, Bld: 84 mg/dL (ref 70–99)
Potassium: 3.7 mEq/L (ref 3.5–5.1)
Sodium: 138 mEq/L (ref 135–145)
Total Bilirubin: 0.5 mg/dL (ref 0.2–1.2)
Total Protein: 6.7 g/dL (ref 6.0–8.3)

## 2022-06-15 LAB — B12 AND FOLATE PANEL
Folate: >23.8 ng/mL (ref >5.9)
Vitamin B-12: 304 pg/mL (ref 211–911)

## 2022-06-15 LAB — HEMOGLOBIN A1C: Hgb A1c MFr Bld: 5.5 % (ref 4.6–6.5)

## 2022-06-15 LAB — CBC WITH DIFFERENTIAL/PLATELET
Basophils Absolute: 0 10*3/uL (ref 0.0–0.1)
Basophils Relative: 0.8 % (ref 0.0–3.0)
Eosinophils Absolute: 0.1 10*3/uL (ref 0.0–0.7)
Eosinophils Relative: 1 % (ref 0.0–5.0)
HCT: 37.4 % (ref 36.0–46.0)
Hemoglobin: 12.6 g/dL (ref 12.0–15.0)
Lymphocytes Relative: 33.5 % (ref 12.0–46.0)
Lymphs Abs: 1.8 10*3/uL (ref 0.7–4.0)
MCHC: 33.6 g/dL (ref 30.0–36.0)
MCV: 93.6 fl (ref 78.0–100.0)
Monocytes Absolute: 0.6 10*3/uL (ref 0.1–1.0)
Monocytes Relative: 10.5 % (ref 3.0–12.0)
Neutro Abs: 3 10*3/uL (ref 1.4–7.7)
Neutrophils Relative %: 54.2 % (ref 43.0–77.0)
Platelets: 237 10*3/uL (ref 150.0–400.0)
RBC: 4 Mil/uL (ref 3.87–5.11)
RDW: 13.1 % (ref 11.5–15.5)
WBC: 5.5 10*3/uL (ref 4.0–10.5)

## 2022-06-15 LAB — VITAMIN D 25 HYDROXY (VIT D DEFICIENCY, FRACTURES): VITD: 43.69 ng/mL (ref 30.00–100.00)

## 2022-06-15 LAB — TSH: TSH: 1.07 u[IU]/mL (ref 0.35–5.50)

## 2022-06-15 MED ORDER — ALPRAZOLAM 0.25 MG PO TABS
0.2500 mg | ORAL_TABLET | Freq: Every evening | ORAL | 5 refills | Status: DC | PRN
  Filled 2022-06-15: qty 30, 30d supply, fill #0
  Filled 2022-12-05: qty 30, 30d supply, fill #1

## 2022-06-15 NOTE — Telephone Encounter (Signed)
Refilled: 08/27/2021 Last OV: 06/14/2022 Next OV: 12/14/2022

## 2022-06-20 ENCOUNTER — Ambulatory Visit: Payer: No Typology Code available for payment source | Admitting: Internal Medicine

## 2022-06-27 ENCOUNTER — Encounter: Payer: Self-pay | Admitting: Obstetrics and Gynecology

## 2022-07-06 ENCOUNTER — Encounter: Payer: Self-pay | Admitting: Internal Medicine

## 2022-07-06 DIAGNOSIS — G250 Essential tremor: Secondary | ICD-10-CM

## 2022-07-08 ENCOUNTER — Other Ambulatory Visit: Payer: Self-pay

## 2022-07-08 MED ORDER — PROPRANOLOL HCL ER 60 MG PO CP24
60.0000 mg | ORAL_CAPSULE | Freq: Every day | ORAL | 0 refills | Status: DC
  Filled 2022-07-08: qty 90, 90d supply, fill #0

## 2022-07-08 NOTE — Assessment & Plan Note (Signed)
Metoprolol not successful.  Trial of inderal LA

## 2022-07-14 ENCOUNTER — Ambulatory Visit (INDEPENDENT_AMBULATORY_CARE_PROVIDER_SITE_OTHER): Payer: 59 | Admitting: Obstetrics and Gynecology

## 2022-07-14 ENCOUNTER — Other Ambulatory Visit: Payer: Self-pay

## 2022-07-14 ENCOUNTER — Encounter: Payer: Self-pay | Admitting: Obstetrics and Gynecology

## 2022-07-14 VITALS — BP 101/62 | HR 72 | Resp 16 | Ht 64.0 in | Wt 178.9 lb

## 2022-07-14 DIAGNOSIS — N921 Excessive and frequent menstruation with irregular cycle: Secondary | ICD-10-CM

## 2022-07-14 DIAGNOSIS — N939 Abnormal uterine and vaginal bleeding, unspecified: Secondary | ICD-10-CM

## 2022-07-14 MED ORDER — ESTRADIOL 1 MG PO TABS
1.0000 mg | ORAL_TABLET | Freq: Every day | ORAL | 1 refills | Status: DC
  Filled 2022-07-14: qty 14, 14d supply, fill #0
  Filled 2022-10-11: qty 14, 14d supply, fill #1

## 2022-07-14 NOTE — Progress Notes (Signed)
    GYNECOLOGY PROGRESS NOTE  Subjective:    Patient ID: Brianna Washington, female    DOB: 08-14-1980, 42 y.o.   MRN: [id number]  HPI  Patient is a 42 y.o. G6P2001 female who presents for breakthrough bleeding with Nexplanon. She reports bleeding from Dec. 3 - Dec. 27, Bleeding has been light, but persistent, typically wears a daily pantyliner. She did also experience some mild cramping while she was bleeding.   The following portions of the patient's history were reviewed and updated as appropriate: allergies, current medications, past family history, past medical history, past social history, past surgical history, and problem list.  Review of Systems Pertinent items noted in HPI and remainder of comprehensive ROS otherwise negative.   Objective:  Blood pressure 101/62, pulse 72, resp. rate 16, height 5\' 4"  (1.626 m), weight 178 lb 14.4 oz (81.1 kg). Body mass index is 30.71 kg/m.  General appearance: alert, cooperative, and no distress Remainder of exam deferred.   Assessment:   1. Breakthrough bleeding on Nexplanon     Plan:   Patient with breakthrough bleeding on Nexplanon. Discussed management options including add back therapy with short trial of estrogen or use of NSAIDs. Patient notes that she would like to try estrogen therapy. Will prescribe Estradiol 1 mg daily for 2 weeks. RTC if symptoms worsen or fail to improve.    Rubie Maid, MD [place]

## 2022-08-11 ENCOUNTER — Ambulatory Visit
Admission: RE | Admit: 2022-08-11 | Discharge: 2022-08-11 | Disposition: A | Discharge status: Home / Self Care | Payer: 59 | Source: Ambulatory Visit | Attending: Obstetrics and Gynecology | Admitting: Obstetrics and Gynecology

## 2022-08-11 DIAGNOSIS — Z1231 Encounter for screening mammogram for malignant neoplasm of breast: Secondary | ICD-10-CM | POA: Insufficient documentation

## 2022-08-12 ENCOUNTER — Other Ambulatory Visit: Payer: Self-pay | Admitting: Obstetrics and Gynecology

## 2022-08-12 DIAGNOSIS — R928 Other abnormal and inconclusive findings on diagnostic imaging of breast: Secondary | ICD-10-CM

## 2022-08-15 ENCOUNTER — Other Ambulatory Visit: Payer: Self-pay | Admitting: Obstetrics and Gynecology

## 2022-08-15 DIAGNOSIS — R928 Other abnormal and inconclusive findings on diagnostic imaging of breast: Secondary | ICD-10-CM

## 2022-08-15 DIAGNOSIS — N6489 Other specified disorders of breast: Secondary | ICD-10-CM

## 2022-08-15 DIAGNOSIS — N63 Unspecified lump in unspecified breast: Secondary | ICD-10-CM

## 2022-08-16 ENCOUNTER — Encounter: Payer: Self-pay | Admitting: Obstetrics and Gynecology

## 2022-08-19 ENCOUNTER — Ambulatory Visit
Admission: RE | Admit: 2022-08-19 | Discharge: 2022-08-19 | Disposition: A | Discharge status: Home / Self Care | Payer: 59 | Source: Ambulatory Visit | Attending: Obstetrics and Gynecology | Admitting: Obstetrics and Gynecology

## 2022-08-19 DIAGNOSIS — N6489 Other specified disorders of breast: Secondary | ICD-10-CM

## 2022-08-19 DIAGNOSIS — R928 Other abnormal and inconclusive findings on diagnostic imaging of breast: Secondary | ICD-10-CM | POA: Diagnosis not present

## 2022-08-19 DIAGNOSIS — N63 Unspecified lump in unspecified breast: Secondary | ICD-10-CM

## 2022-08-30 ENCOUNTER — Encounter: Payer: Self-pay | Admitting: Internal Medicine

## 2022-08-31 ENCOUNTER — Encounter: Payer: Self-pay | Admitting: Internal Medicine

## 2022-10-11 ENCOUNTER — Other Ambulatory Visit: Payer: Self-pay | Admitting: Internal Medicine

## 2022-10-11 ENCOUNTER — Other Ambulatory Visit: Payer: Self-pay

## 2022-10-11 MED ORDER — PROPRANOLOL HCL ER 60 MG PO CP24
60.0000 mg | ORAL_CAPSULE | Freq: Every day | ORAL | 1 refills | Status: DC
  Filled 2022-10-11: qty 90, 90d supply, fill #0
  Filled 2023-01-10: qty 90, 90d supply, fill #1

## 2022-12-05 ENCOUNTER — Other Ambulatory Visit: Payer: Self-pay

## 2022-12-12 ENCOUNTER — Other Ambulatory Visit: Payer: Self-pay | Admitting: Internal Medicine

## 2022-12-13 ENCOUNTER — Other Ambulatory Visit: Payer: Self-pay | Admitting: Internal Medicine

## 2022-12-13 ENCOUNTER — Other Ambulatory Visit: Payer: Self-pay

## 2022-12-14 ENCOUNTER — Other Ambulatory Visit: Payer: Self-pay

## 2022-12-14 ENCOUNTER — Ambulatory Visit (INDEPENDENT_AMBULATORY_CARE_PROVIDER_SITE_OTHER): Payer: 59 | Admitting: Internal Medicine

## 2022-12-14 ENCOUNTER — Encounter: Payer: Self-pay | Admitting: Internal Medicine

## 2022-12-14 VITALS — BP 98/66 | HR 74 | Temp 98.0°F | Ht 64.0 in | Wt 188.8 lb

## 2022-12-14 DIAGNOSIS — E785 Hyperlipidemia, unspecified: Secondary | ICD-10-CM | POA: Diagnosis not present

## 2022-12-14 DIAGNOSIS — G250 Essential tremor: Secondary | ICD-10-CM

## 2022-12-14 DIAGNOSIS — E663 Overweight: Secondary | ICD-10-CM

## 2022-12-14 MED ORDER — PHENTERMINE HCL 37.5 MG PO TABS
37.5000 mg | ORAL_TABLET | Freq: Every day | ORAL | 2 refills | Status: DC
  Filled 2022-12-14: qty 30, 30d supply, fill #0
  Filled 2023-01-10: qty 30, 30d supply, fill #1
  Filled 2023-02-15: qty 30, 30d supply, fill #2

## 2022-12-14 MED ORDER — TRAZODONE HCL 50 MG PO TABS
50.0000 mg | ORAL_TABLET | Freq: Every day | ORAL | 3 refills | Status: DC
  Filled 2022-12-14: qty 90, 90d supply, fill #0
  Filled 2023-03-16: qty 90, 90d supply, fill #1
  Filled 2023-06-13: qty 90, 90d supply, fill #2
  Filled 2023-09-21: qty 90, 90d supply, fill #3

## 2022-12-14 NOTE — Progress Notes (Signed)
Subjective:  Patient ID: Brianna Washington, female    DOB: [birth year]  Age: 42 y.o. MRN: [id number]  CC: The primary encounter diagnosis was Dyslipidemia. Diagnoses of Essential tremor and Overweight (BMI 25.0-29.9) were also pertinent to this visit.   HPI Brianna Washington presents for  Chief Complaint  Patient presents with   Medical Management of Chronic Issues    6 month follow up     1) Obesity:   WEight loss history.  Her Weight was  reduced to 148 lbs. On 1.0  mg dose of ozempic and maintained  on reduced dose of Ozempic to 0.5 mg weekly.  However she  had to stop using the medication due to lack of coverage, and regained > 30 lbs.  Samples of Mounjaro 2.5 mg dose were given  in December  at the 2.5 mg dose, due to ongoing weight gain. Lost  6  lbs on the starting dose.  Unfortunately, her insurance will not pay for the medication,  and she has regained some weight. She is frustrated,  but she has not been exercising .    She works full time as an Char[employer] fundraiser and is raising  two young children,  ages 47 and 42 yr old .  Her husband is supportive of her weight loss efforts  Reviewed past success with phentermine and exercise   She tolerated the medication without side effects.   Discussed her work schedule and home respoonsibilities and identified ways to multitask supervision of her children's activities with exercise.     Outpatient Medications Prior to Visit  Medication Sig Dispense Refill   ALPRAZolam (XANAX) 0.25 MG tablet Take 1 tablet (0.25 mg total) by mouth at bedtime as needed for anxiety or sleep. 30 tablet 5   Cholecalciferol (VITAMIN D3 PO) Take 1 capsule by mouth daily.     diphenhydrAMINE (BENADRYL) 25 MG tablet Take 25 mg by mouth every 6 (six) hours as needed.     fexofenadine (ALLEGRA) 60 MG tablet Take 60 mg by mouth daily.      Omega-3 Fatty Acids (FISH OIL) 1200 MG CAPS Take 1 capsule by mouth daily.     propranolol ER (INDERAL LA) 60 MG 24 hr capsule Take 1 capsule (60  mg total) by mouth daily. 90 capsule 1   Turmeric 400 MG CAPS Take 1 capsule by mouth daily.     vitamin C (ASCORBIC ACID) 500 MG tablet Take 500 mg by mouth 2 (two) times daily.     traZODone (DESYREL) 50 MG tablet 1/2 tablet 30 minutes before  bedtime.  Increase as needed , max dose 100 mg 90 tablet 1   azithromycin (ZITHROMAX) 500 MG tablet Take 1 tablet (500 mg total) by mouth daily. (Patient not taking: Reported on 12/14/2022) 7 tablet 0   estradiol (ESTRACE) 1 MG tablet Take 1 tablet (1 mg total) by mouth daily. 14 tablet 1   ondansetron (ZOFRAN) 4 MG tablet Take 1 tablet (4 mg total) by mouth every 8 (eight) hours as needed for nausea or vomiting. (Patient not taking: Reported on 12/14/2022) 20 tablet 0   scopolamine (TRANSDERM SCOP, 1.5 MG,) 1 MG/3DAYS Place 1 patch (1.5 mg total) onto the skin every 3 (three) days. (Patient not taking: Reported on 12/14/2022) 10 patch 0   sertraline (ZOLOFT) 50 MG tablet Take 1 tablet (50 mg total) by mouth daily. 90 tablet 1   tirzepatide (MOUNJARO) 2.5 MG/0.5ML Pen Inject 2.5 mg into the skin once a week. 2  mL 2   No facility-administered medications prior to visit.    Review of Systems;  Patient denies headache, fevers, malaise, unintentional weight loss, skin rash, eye pain, sinus congestion and sinus pain, sore throat, dysphagia,  hemoptysis , cough, dyspnea, wheezing, chest pain, palpitations, orthopnea, edema, abdominal pain, nausea, melena, diarrhea, constipation, flank pain, dysuria, hematuria, urinary  Frequency, nocturia, numbness, tingling, seizures,  Focal weakness, Loss of consciousness,  Tremor, insomnia, depression, anxiety, and suicidal ideation.      Objective:  BP 98/66   Pulse 74   Temp 98 F (36.7 C) (Oral)   Ht 5\' 4"  (1.626 m)   Wt 188 lb 12.8 oz (85.6 kg)   SpO2 97%   BMI 32.41 kg/m   BP Readings from Last 3 Encounters:  12/14/22 98/66  07/14/22 101/62  06/14/22 100/74    Wt Readings from Last 3 Encounters:  12/14/22  188 lb 12.8 oz (85.6 kg)  07/14/22 178 lb 14.4 oz (81.1 kg)  06/14/22 182 lb 3.2 oz (82.6 kg)    Physical Exam Vitals reviewed.  Constitutional:      General: She is not in acute distress.    Appearance: Normal appearance. She is normal weight. She is not ill-appearing, toxic-appearing or diaphoretic.  HENT:     Head: Normocephalic.  Eyes:     General: No scleral icterus.       Right eye: No discharge.        Left eye: No discharge.     Conjunctiva/sclera: Conjunctivae normal.  Cardiovascular:     Rate and Rhythm: Normal rate and regular rhythm.     Heart sounds: Normal heart sounds.  Pulmonary:     Effort: Pulmonary effort is normal. No respiratory distress.     Breath sounds: Normal breath sounds.  Musculoskeletal:        General: Normal range of motion.  Skin:    General: Skin is warm and dry.  Neurological:     General: No focal deficit present.     Mental Status: She is alert and oriented to person, place, and time. Mental status is at baseline.  Psychiatric:        Mood and Affect: Mood normal.        Behavior: Behavior normal.        Thought Content: Thought content normal.        Judgment: Judgment normal.    Lab Results  Component Value Date   HGBA1C 5.5 06/14/2022   HGBA1C 4.9 05/09/2016    Lab Results  Component Value Date   CREATININE 0.76 06/14/2022   CREATININE 0.76 01/06/2021   CREATININE 0.82 05/13/2020    Lab Results  Component Value Date   WBC 5.5 06/14/2022   HGB 12.6 06/14/2022   HCT 37.4 06/14/2022   PLT 237.0 06/14/2022   GLUCOSE 84 06/14/2022   CHOL 147 10/30/2019   TRIG 70 10/30/2019   HDL 47 10/30/2019   LDLDIRECT 147.5 07/26/2013   LDLCALC 86 10/30/2019   ALT 16 06/14/2022   AST 17 06/14/2022   NA 138 06/14/2022   K 3.7 06/14/2022   CL 105 06/14/2022   CREATININE 0.76 06/14/2022   BUN 12 06/14/2022   CO2 28 06/14/2022   TSH 1.07 06/14/2022   HGBA1C 5.5 06/14/2022      Assessment & Plan:  .Dyslipidemia -      Comprehensive metabolic panel; Future -     LDL cholesterol, direct; Future -     Lipid panel; Future  Essential  tremor Assessment & Plan: Metoprolol not successful.  Trial of inderal LA has been helpful   Overweight (BMI 25.0-29.9) Assessment & Plan: She has had difficulty losing weight due to increased appetite and is requesting a repeat  trial of  Phentermine.  She is aware of the possible side effects and risks and understands that    The medication will be discontinued if she has not lost 5% of her body weight over the next 3 months, which , based on today's weight is 14 lbs.    Other orders -     Phentermine HCl; Take 1 tablet (37.5 mg total) by mouth daily before breakfast.  Dispense: 30 tablet; Refill: 2 -     traZODone HCl; Take 1 tablet (50 mg total) by mouth at bedtime.  Dispense: 90 tablet; Refill: 3     I provided 30 minutes of face-to-face time during this encounter reviewing patient's last visit with me, patient's  most recent visit with cardiology,  nephrology,  and neurology,  recent surgical and non surgical procedures, previous  labs and imaging studies, counseling on currently addressed issues,  and post visit ordering to diagnostics and therapeutics .   Follow-up: Return in about 3 months (around 03/16/2023) for WEIGHT MANAGEMENT.   Sherlene Shams, MD

## 2022-12-14 NOTE — Patient Instructions (Addendum)
I have refilled  phentermine for 3 months  and a  return to see me in 3 months. Goal weight loss is 5% of today's weight by then or phentermine will be discontinued.     USE THE GYMNASTICS TIME TO EXERCISE!  Here are the low carb ready made options   [employer]ik[employer] triple zero Dannon lt n fit  [place] 2 Good 2 be True  Veggies Made Great ! Makes a low carb spinach & egg white frittata    Health[employer] Choice "low carb power bowl"  entrees and  "Steamer" entrees are are great low carb entrees that microwave in 5 minutes     Protein shakes:  premier  protein

## 2022-12-15 ENCOUNTER — Other Ambulatory Visit: Payer: Self-pay

## 2022-12-17 NOTE — Assessment & Plan Note (Addendum)
She has had difficulty losing weight due to increased appetite and is requesting a repeat  trial of  Phentermine.  She is aware of the possible side effects and risks and understands that    The medication will be discontinued if she has not lost 5% of her body weight over the next 3 months, which , based on today's weight is 14 lbs.

## 2022-12-17 NOTE — Assessment & Plan Note (Signed)
Metoprolol not successful.  Trial of inderal LA has been helpful

## 2023-01-10 ENCOUNTER — Other Ambulatory Visit: Payer: Self-pay

## 2023-01-18 ENCOUNTER — Other Ambulatory Visit: Payer: Self-pay | Admitting: Obstetrics and Gynecology

## 2023-01-18 DIAGNOSIS — N63 Unspecified lump in unspecified breast: Secondary | ICD-10-CM

## 2023-01-18 DIAGNOSIS — N6489 Other specified disorders of breast: Secondary | ICD-10-CM

## 2023-01-24 ENCOUNTER — Other Ambulatory Visit: Payer: Self-pay | Admitting: Oncology

## 2023-01-24 DIAGNOSIS — Z006 Encounter for examination for normal comparison and control in clinical research program: Secondary | ICD-10-CM

## 2023-02-06 ENCOUNTER — Other Ambulatory Visit
Admission: RE | Admit: 2023-02-06 | Discharge: 2023-02-06 | Disposition: A | Discharge status: Home / Self Care | Payer: Self-pay | Source: Ambulatory Visit | Attending: Oncology | Admitting: Oncology

## 2023-02-06 DIAGNOSIS — Z006 Encounter for examination for normal comparison and control in clinical research program: Secondary | ICD-10-CM | POA: Insufficient documentation

## 2023-02-15 ENCOUNTER — Other Ambulatory Visit: Payer: Self-pay

## 2023-02-20 ENCOUNTER — Other Ambulatory Visit: Payer: 59

## 2023-02-20 ENCOUNTER — Inpatient Hospital Stay: Admission: RE | Admit: 2023-02-20 | Payer: 59 | Source: Ambulatory Visit

## 2023-03-02 ENCOUNTER — Ambulatory Visit
Admission: RE | Admit: 2023-03-02 | Discharge: 2023-03-02 | Disposition: A | Discharge status: Home / Self Care | Payer: 59 | Source: Ambulatory Visit | Attending: Obstetrics and Gynecology | Admitting: Obstetrics and Gynecology

## 2023-03-02 DIAGNOSIS — N63 Unspecified lump in unspecified breast: Secondary | ICD-10-CM | POA: Insufficient documentation

## 2023-03-02 DIAGNOSIS — N6489 Other specified disorders of breast: Secondary | ICD-10-CM | POA: Diagnosis not present

## 2023-03-02 DIAGNOSIS — N6002 Solitary cyst of left breast: Secondary | ICD-10-CM | POA: Diagnosis not present

## 2023-03-02 DIAGNOSIS — R92333 Mammographic heterogeneous density, bilateral breasts: Secondary | ICD-10-CM | POA: Diagnosis not present

## 2023-03-02 DIAGNOSIS — R928 Other abnormal and inconclusive findings on diagnostic imaging of breast: Secondary | ICD-10-CM | POA: Diagnosis not present

## 2023-03-16 ENCOUNTER — Other Ambulatory Visit: Payer: Self-pay | Admitting: Internal Medicine

## 2023-03-17 ENCOUNTER — Other Ambulatory Visit: Payer: Self-pay | Admitting: Internal Medicine

## 2023-03-17 ENCOUNTER — Other Ambulatory Visit: Payer: Self-pay

## 2023-03-17 MED FILL — Alprazolam Tab 0.25 MG: ORAL | 30 days supply | Qty: 30 | Fill #0 | Status: AC

## 2023-03-19 ENCOUNTER — Other Ambulatory Visit: Payer: Self-pay

## 2023-03-20 ENCOUNTER — Encounter: Payer: Self-pay | Admitting: Internal Medicine

## 2023-03-20 ENCOUNTER — Telehealth: Payer: Self-pay

## 2023-03-20 ENCOUNTER — Ambulatory Visit (INDEPENDENT_AMBULATORY_CARE_PROVIDER_SITE_OTHER): Payer: 59 | Admitting: Internal Medicine

## 2023-03-20 ENCOUNTER — Other Ambulatory Visit: Payer: Self-pay

## 2023-03-20 VITALS — BP 92/54 | HR 72 | Temp 98.2°F | Ht 64.0 in | Wt 182.4 lb

## 2023-03-20 DIAGNOSIS — G250 Essential tremor: Secondary | ICD-10-CM

## 2023-03-20 DIAGNOSIS — Z0000 Encounter for general adult medical examination without abnormal findings: Secondary | ICD-10-CM

## 2023-03-20 DIAGNOSIS — F411 Generalized anxiety disorder: Secondary | ICD-10-CM

## 2023-03-20 DIAGNOSIS — E663 Overweight: Secondary | ICD-10-CM

## 2023-03-20 MED ORDER — PROPRANOLOL HCL ER 60 MG PO CP24
60.0000 mg | ORAL_CAPSULE | Freq: Every day | ORAL | 1 refills | Status: DC
  Filled 2023-03-20 – 2023-04-17 (×2): qty 90, 90d supply, fill #0
  Filled 2023-07-21: qty 90, 90d supply, fill #1

## 2023-03-20 MED ORDER — SERTRALINE HCL 50 MG PO TABS
50.0000 mg | ORAL_TABLET | Freq: Every day | ORAL | 1 refills | Status: DC
  Filled 2023-03-20: qty 90, 90d supply, fill #0
  Filled 2023-06-23: qty 90, 90d supply, fill #1

## 2023-03-20 MED ORDER — METFORMIN HCL ER 500 MG PO TB24
500.0000 mg | ORAL_TABLET | Freq: Every day | ORAL | 1 refills | Status: DC
  Filled 2023-03-20: qty 90, 90d supply, fill #0

## 2023-03-20 NOTE — Assessment & Plan Note (Signed)
Metoprolol not successful.  Trial of inderal LA has been helpful

## 2023-03-20 NOTE — Assessment & Plan Note (Signed)

## 2023-03-20 NOTE — Telephone Encounter (Signed)
Patient states at check-out that she would like to have her labs drawn at the hospital in December.

## 2023-03-20 NOTE — Assessment & Plan Note (Signed)
She has had difficulty losing weight due to increased appetite and did not tolerate a  repeat  trial of  Phentermine.   Dietary review done and counselling given. Trial of metformin .

## 2023-03-20 NOTE — Assessment & Plan Note (Signed)
Needs to resume f sertraline 50 mg daily.  Adding trazodone for insomnia to mitigate risk of daily alprazolam use.

## 2023-03-20 NOTE — Progress Notes (Signed)
Patient ID: Brianna Washington, female    DOB: 12/22/1980  Age: 42 y.o. MRN: [id number]  The patient is here for annual preventive examination and management of other chronic and acute problems.   The risk factors are reflected in the social history.   The roster of all physicians providing medical care to patient - is listed in the Snapshot section of the chart.   Activities of daily living:  The patient is 100% independent in all ADLs: dressing, toileting, feeding as well as independent mobility   Home safety : The patient has smoke detectors in the home. They wear seatbelts.  There are no unsecured firearms at home. There is no violence in the home.    There is no risks for hepatitis, STDs or HIV. There is no   history of blood transfusion. They have no travel history to infectious disease endemic areas of the world.   The patient has seen their dentist in the last six month. They have seen their eye doctor in the last year. The patinet  denies slight hearing difficulty with regard to whispered voices and some television programs.  They have deferred audiologic testing in the last year.  They do not  have excessive sun exposure. Discussed the need for sun protection: hats, long sleeves and use of sunscreen if there is significant sun exposure.    Diet: the importance of a healthy diet is discussed. They do have a healthy diet.   The benefits of regular aerobic exercise were discussed. The patient  exercises  3  per week  for  30 minutes.    Depression screen: there are no signs or vegative symptoms of depression- irritability, change in appetite, anhedonia, sadness/tearfullness.   The following portions of the patient's history were reviewed and updated as appropriate: allergies, current medications, past family history, past medical history,  past surgical history, past social history  and problem list.   Visual acuity was not assessed per patient preference since the patient has regular  follow up with an  ophthalmologist. Hearing and body mass index were assessed and reviewed.    During the course of the visit the patient was educated and counseled about appropriate screening and preventive services including : fall prevention , diabetes screening, nutrition counseling, colorectal cancer screening, and recommended immunizations.    Chief Complaint:  1) obesity:  used phentermine  prescribed from June 6 to Sept 7 but it aggravated her anxiety and insomnia.    lost 7 lbs.  Loves ot eat  craves carbs.    2) anxiety:  finder herself worrying constantly about her kids who are fine.   3) Tremor: using propranolol      Review of Symptoms  Patient denies headache, fevers, malaise, unintentional weight loss, skin rash, eye pain, sinus congestion and sinus pain, sore throat, dysphagia,  hemoptysis , cough, dyspnea, wheezing, chest pain, palpitations, orthopnea, edema, abdominal pain, nausea, melena, diarrhea, constipation, flank pain, dysuria, hematuria, urinary  Frequency, nocturia, numbness, tingling, seizures,  Focal weakness, Loss of consciousness,  Tremor, insomnia, depression, anxiety, and suicidal ideation.    Physical Exam:  BP (!) 92/54   Pulse 72   Temp 98.2 F (36.8 C) (Oral)   Ht 5\' 4"  (1.626 m)   Wt 182 lb 6.4 oz (82.7 kg)   SpO2 98%   BMI 31.31 kg/m    Physical Exam Vitals reviewed.  Constitutional:      General: She is not in acute distress.    Appearance: Normal  appearance. She is normal weight. She is not ill-appearing, toxic-appearing or diaphoretic.  HENT:     Head: Normocephalic.  Eyes:     General: No scleral icterus.       Right eye: No discharge.        Left eye: No discharge.     Conjunctiva/sclera: Conjunctivae normal.  Cardiovascular:     Rate and Rhythm: Normal rate and regular rhythm.     Heart sounds: Normal heart sounds.  Pulmonary:     Effort: Pulmonary effort is normal. No respiratory distress.     Breath sounds: Normal breath  sounds.  Musculoskeletal:        General: Normal range of motion.  Skin:    General: Skin is warm and dry.  Neurological:     General: No focal deficit present.     Mental Status: She is alert and oriented to person, place, and time. Mental status is at baseline.  Psychiatric:        Mood and Affect: Mood normal.        Behavior: Behavior normal.        Thought Content: Thought content normal.        Judgment: Judgment normal.    Assessment and Plan: Anxiety state Assessment & Plan: Needs to resume f sertraline 50 mg daily.  Adding trazodone for insomnia to mitigate risk of daily alprazolam use.    Overweight (BMI 25.0-29.9) Assessment & Plan: She has had difficulty losing weight due to increased appetite and did not tolerate a  repeat  trial of  Phentermine.   Dietary review done and counselling given. Trial of metformin .    Essential tremor Assessment & Plan: Metoprolol not successful.  Trial of inderal LA has been helpful   Encounter for preventive health examination Assessment & Plan: age appropriate education and counseling updated, referrals for preventative services and immunizations addressed, dietary and smoking counseling addressed, most recent labs reviewed.  I have personally reviewed and have noted:   1) the patient's medical and social history 2) The pt's use of alcohol, tobacco, and illicit drugs 3) The patient's current medications and supplements 4) Functional ability including ADL's, fall risk, home safety risk, hearing and visual impairment 5) Diet and physical activities 6) Evidence for depression or mood disorder 7) The patient's height, weight, and BMI have been recorded in the chart   I have made referrals, and provided counseling and education based on review of the above    Other orders -     metFORMIN HCl ER; Take 1 tablet (500 mg total) by mouth daily with breakfast.  Dispense: 90 tablet; Refill: 1 -     Propranolol HCl ER; Take 1 capsule  (60 mg total) by mouth daily.  Dispense: 90 capsule; Refill: 1 -     Sertraline HCl; Take 1 tablet (50 mg total) by mouth daily.  Dispense: 90 tablet; Refill: 1    Return in about 3 months (around 06/19/2023).  Sherlene Shams, MD

## 2023-03-20 NOTE — Patient Instructions (Signed)
Trial of metformin for APPETITE SUPPRESSION,   OK TO INCREASE TO 1000 MG IF TOLERATED  I AGREE with resuming generic zoloft but start with 1/2 tablet initially to avoid nausea.   You do not need labs until December

## 2023-03-20 NOTE — Addendum Note (Signed)
Addended by: Sherlene Shams on: 03/20/2023 10:48 PM   Modules accepted: Level of Service

## 2023-03-21 ENCOUNTER — Other Ambulatory Visit: Payer: Self-pay

## 2023-03-23 NOTE — Addendum Note (Signed)
Addended by: Warden Fillers on: 03/23/2023 12:49 PM   Modules accepted: Orders

## 2023-04-17 ENCOUNTER — Other Ambulatory Visit: Payer: Self-pay

## 2023-04-18 ENCOUNTER — Other Ambulatory Visit: Payer: Self-pay

## 2023-04-28 ENCOUNTER — Other Ambulatory Visit: Payer: Self-pay

## 2023-04-28 ENCOUNTER — Ambulatory Visit: Payer: 59 | Admitting: Obstetrics and Gynecology

## 2023-04-28 ENCOUNTER — Encounter: Payer: Self-pay | Admitting: Obstetrics and Gynecology

## 2023-04-28 VITALS — BP 102/58 | HR 69 | Resp 16 | Ht 64.0 in | Wt 184.5 lb

## 2023-04-28 DIAGNOSIS — Z3046 Encounter for surveillance of implantable subdermal contraceptive: Secondary | ICD-10-CM

## 2023-04-28 MED ORDER — NORETHIN ACE-ETH ESTRAD-FE 1-20 MG-MCG PO TABS
1.0000 | ORAL_TABLET | Freq: Every day | ORAL | 3 refills | Status: DC
  Filled 2023-04-28: qty 84, 84d supply, fill #0
  Filled 2023-07-21: qty 84, 84d supply, fill #1
  Filled 2023-10-16: qty 84, 84d supply, fill #2
  Filled 2024-01-01: qty 84, 84d supply, fill #3

## 2023-04-28 NOTE — Progress Notes (Signed)
      GYNECOLOGY OFFICE PROCEDURE NOTE  Brianna Washington is a 42 y.o. G2P2001 here for Nexplanon removal. Reports issues with irregular bleeding. Nexplanon was inserted 09/2021. Last pap smear was on 11/04/2020 and was normal.  No other gynecologic concerns.  Nexplanon Removal Patient identified, informed consent performed, consent signed.   Appropriate time out taken. Nexplanon site identified.  Area prepped in usual sterile fashon. Two ml of 1% lidocaine was used to anesthetize the area at the distal end of the implant. A small stab incision was made right beside the implant on the distal portion.  The Nexplanon rod was grasped using hemostats and removed without difficulty.  There was minimal blood loss. There were no complications.  Steri-strips were applied over the small incision.  A pressure bandage was applied to reduce any bruising.  The patient tolerated the procedure well and was given post procedure instructions.  Patient is planning to use oral contraceptives, prescription given.     Hildred Laser, MD [facility]

## 2023-05-21 ENCOUNTER — Telehealth: Payer: 59 | Admitting: Nurse Practitioner

## 2023-05-21 DIAGNOSIS — J069 Acute upper respiratory infection, unspecified: Secondary | ICD-10-CM

## 2023-05-21 IMAGING — CR DG CHEST 2V
1 series · 2 of 2 positions shown · non-contrast
Comparison: No prior.

CLINICAL DATA: Cough.  Post COVID.

EXAM:
CHEST - 2 VIEW

[Series 1: dg chest 2 view · 0.14mm/px · 2 of 2 slices shown]
[im 1/2]
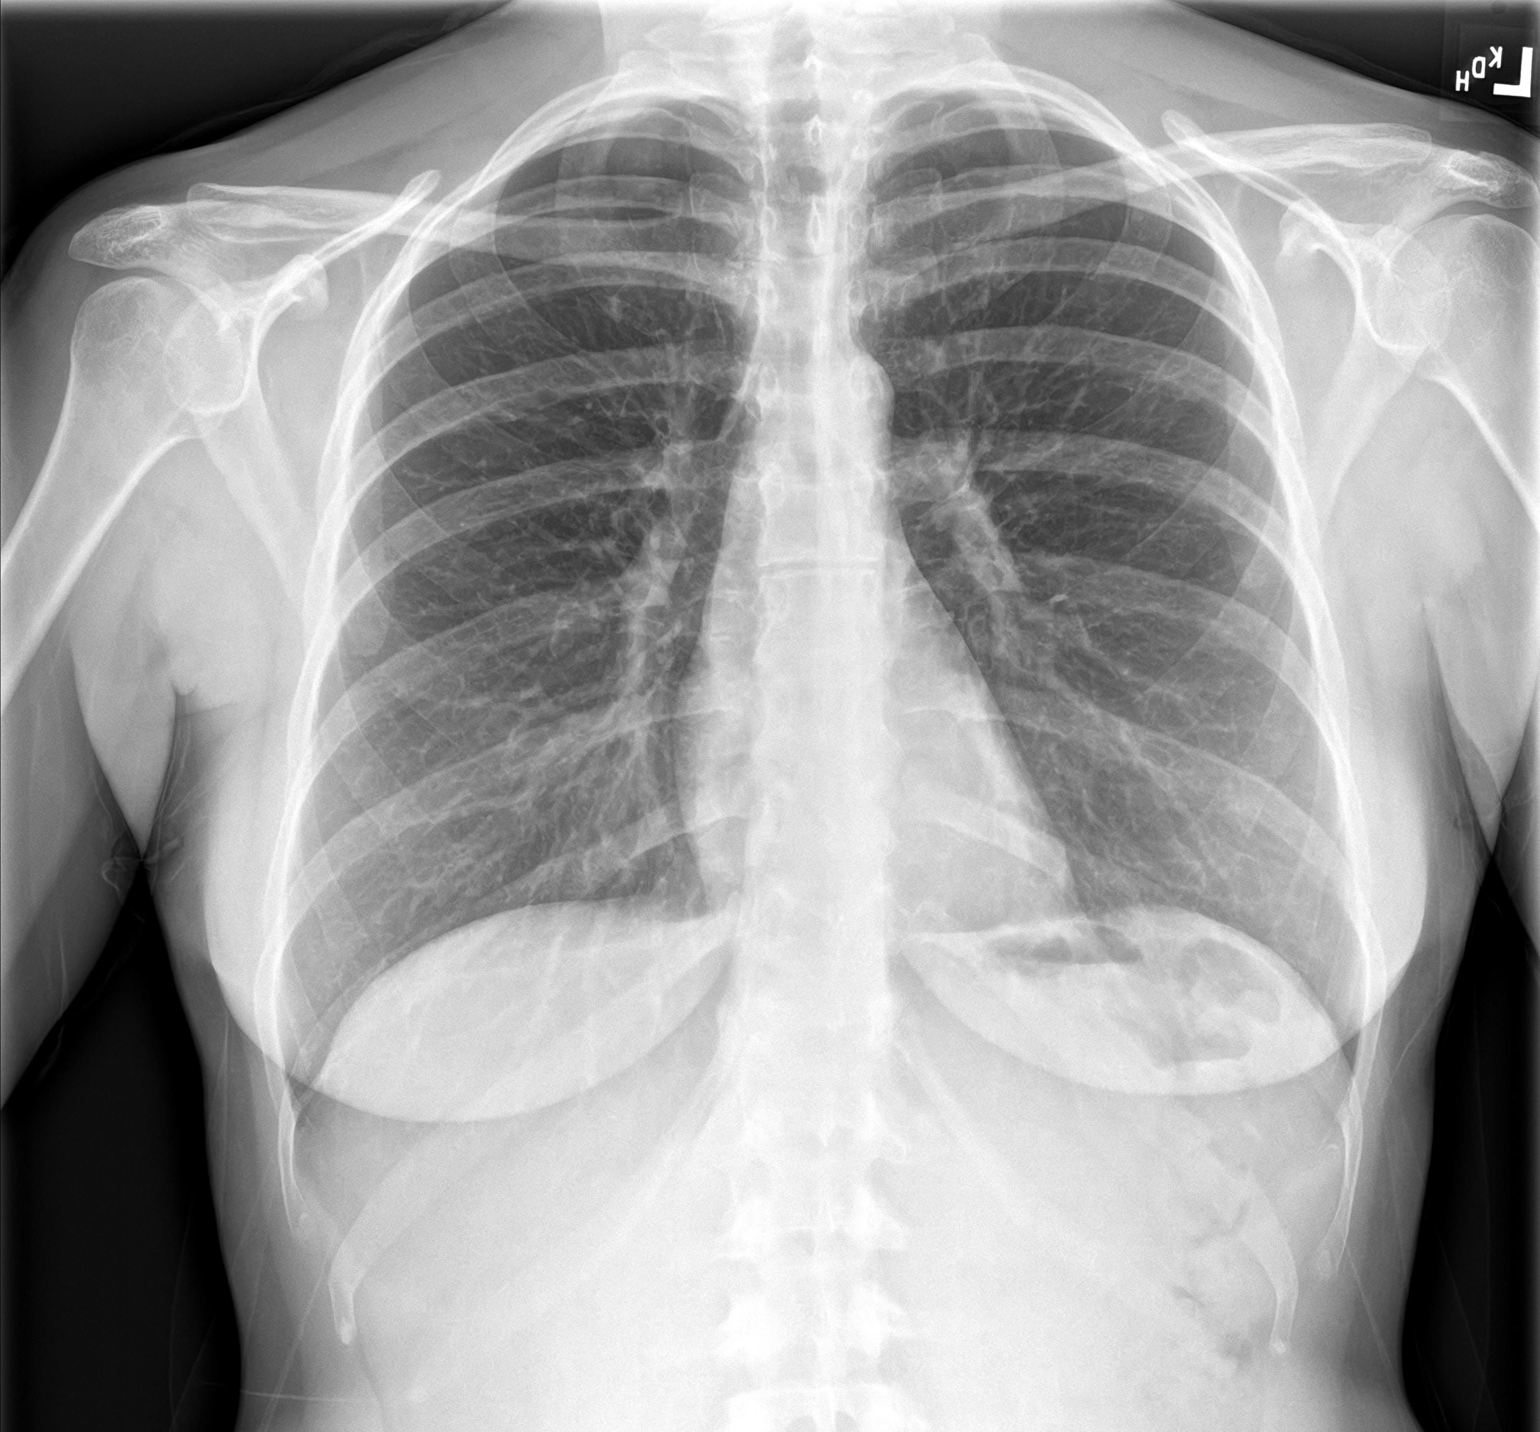
[im 2/2]
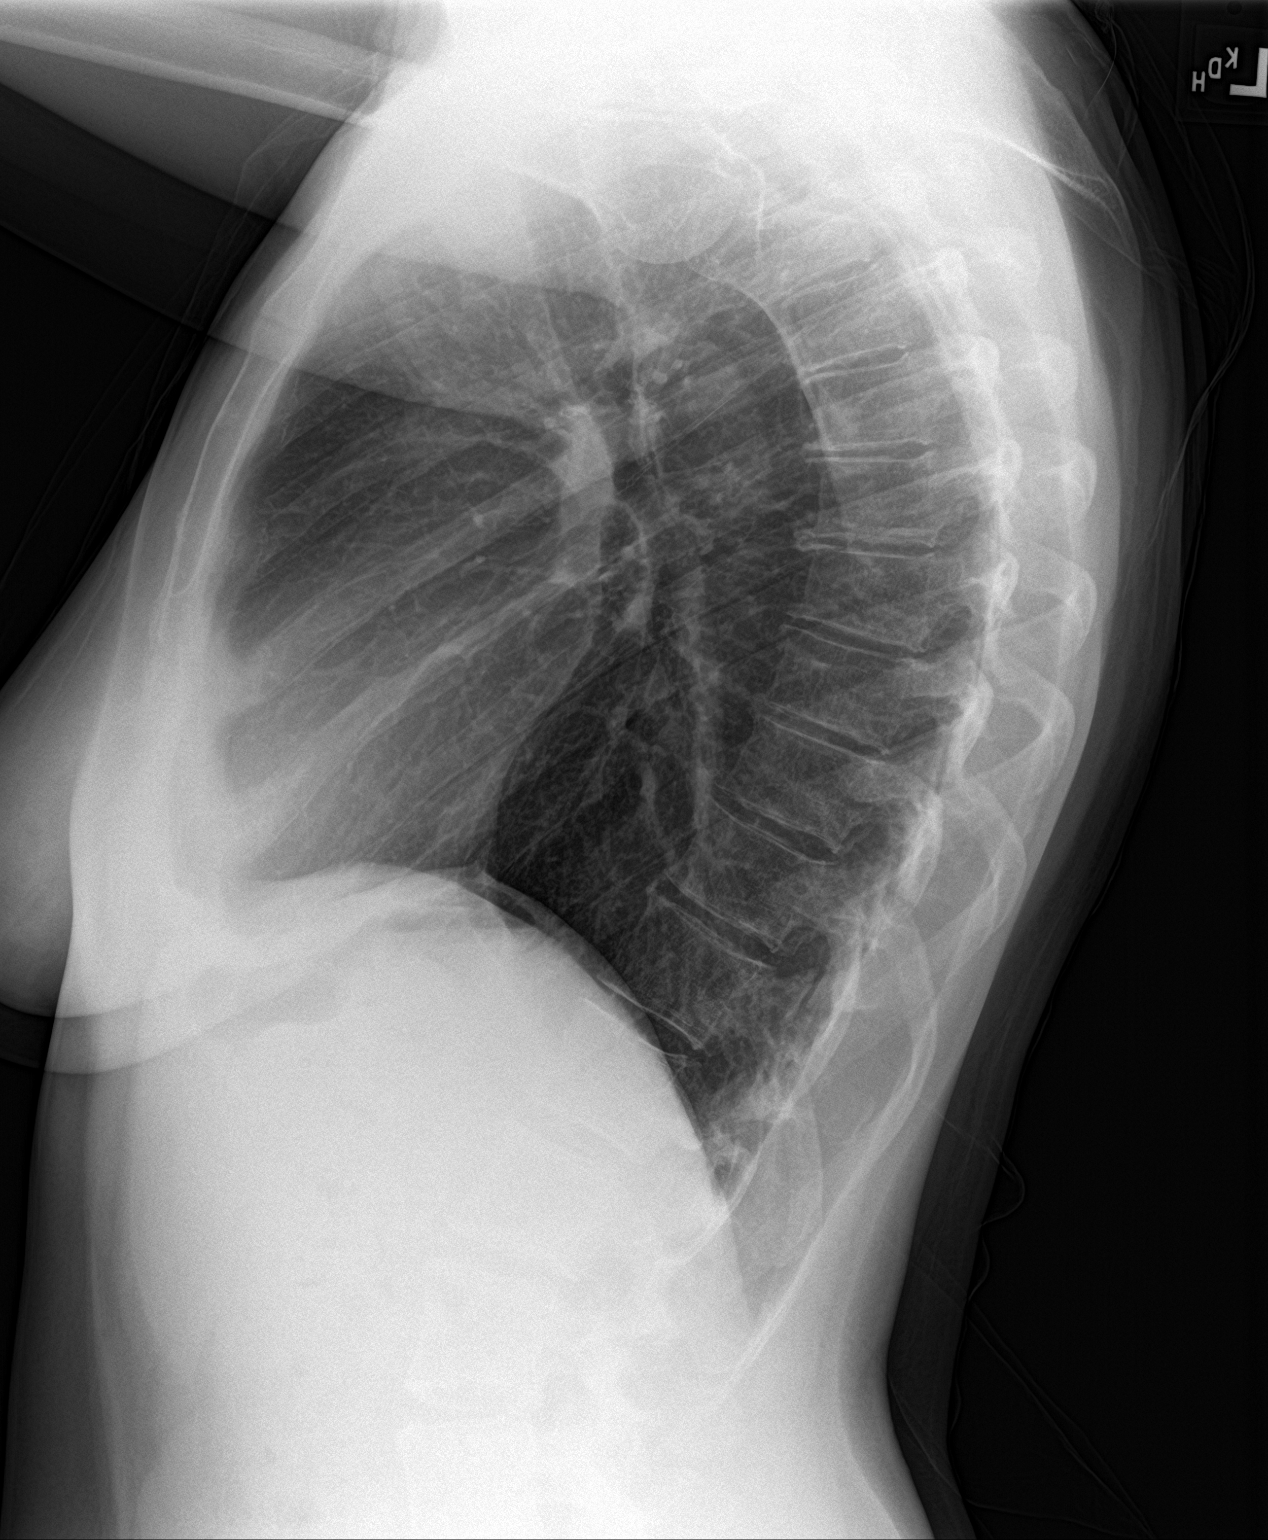

[2 of 2 positions shown; findings below may reference images not displayed]

FINDINGS: Mediastinum and hilar structures normal. Tiny area of subsegmental
atelectasis noted over the left upper lobe posteriorly. Lungs are
clear of infiltrates. No pleural effusion or pneumothorax. Tiny
calcified nodular density noted the right apex consistent with a
small calcified granuloma. No pleural effusion or pneumothorax.
Degenerative change thoracic spine.
IMPRESSION: No acute cardiopulmonary disease identified.

## 2023-05-21 MED ORDER — PROMETHAZINE-DM 6.25-15 MG/5ML PO SYRP: 5.0000 mL | ORAL_SOLUTION | Freq: Every day | ORAL | 0 refills | Status: DC

## 2023-05-21 MED ORDER — FLUTICASONE PROPIONATE 50 MCG/ACT NA SUSP: 2.0000 | Freq: Every day | NASAL | 0 refills | Status: AC

## 2023-05-21 MED ORDER — BENZONATATE 200 MG PO CAPS: 200.0000 mg | ORAL_CAPSULE | Freq: Two times a day (BID) | ORAL | 0 refills | Status: DC | PRN

## 2023-05-21 NOTE — Progress Notes (Signed)
I have spent 5 minutes in review of e-visit questionnaire, review and updating patient chart, medical decision making and response to patient.  ° °Jerrell Mangel W Secilia Apps, NP ° °  °

## 2023-05-21 NOTE — Progress Notes (Signed)
E-Visit for Cough   We are sorry that you are not feeling well.  Here is how we plan to help!  Based on your presentation I believe you most likely have A cough due to a virus.  This is called viral bronchitis and is best treated by rest, plenty of fluids and control of the cough.  You may use Ibuprofen or Tylenol as directed to help your symptoms of headache pain.     In addition I have sent a prescription cough syrup at night, a nasal spray, and cough pills for daytime.   If your cough persists after 10 days please let us know or your PCP.   From your responses in the eVisit questionnaire you describe inflammation in the upper respiratory tract which is causing a significant cough.  This is commonly called Bronchitis and has four common causes:   Allergies Viral Infections Acid Reflux Bacterial Infection Allergies, viruses and acid reflux are treated by controlling symptoms or eliminating the cause. An example might be a cough caused by taking certain blood pressure medications. You stop the cough by changing the medication. Another example might be a cough caused by acid reflux. Controlling the reflux helps control the cough.  USE OF BRONCHODILATOR ("RESCUE") INHALERS: There is a risk from using your bronchodilator too frequently.  The risk is that over-reliance on a medication which only relaxes the muscles surrounding the breathing tubes can reduce the effectiveness of medications prescribed to reduce swelling and congestion of the tubes themselves.  Although you feel brief relief from the bronchodilator inhaler, your asthma may actually be worsening with the tubes becoming more swollen and filled with mucus.  This can delay other crucial treatments, such as oral steroid medications. If you need to use a bronchodilator inhaler daily, several times per day, you should discuss this with your provider.  There are probably better treatments that could be used to keep your asthma under control.      HOME CARE Only take medications as instructed by your medical team. Complete the entire course of an antibiotic. Drink plenty of fluids and get plenty of rest. Avoid close contacts especially the very young and the elderly Cover your mouth if you cough or cough into your sleeve. Always remember to wash your hands A steam or ultrasonic humidifier can help congestion.   GET HELP RIGHT AWAY IF: You develop worsening fever. You become short of breath You cough up blood. Your symptoms persist after you have completed your treatment plan MAKE SURE YOU  Understand these instructions. Will watch your condition. Will get help right away if you are not doing well or get worse.    Thank you for choosing an e-visit.  Your e-visit answers were reviewed by a board certified advanced clinical practitioner to complete your personal care plan. Depending upon the condition, your plan could have included both over the counter or prescription medications.  Please review your pharmacy choice. Make sure the pharmacy is open so you can pick up prescription now. If there is a problem, you may contact your provider through [employer] and have the prescription routed to another pharmacy.  Your safety is important to [place]. If you have drug allergies check your prescription carefully.   For the next 24 hours you can use MyChart to ask questions about today's visit, request a non-urgent call back, or ask for a work or school excuse. You will get an email in the next two days asking about your experience. I  hope that your e-visit has been valuable and will speed your recovery.

## 2023-05-31 ENCOUNTER — Encounter: Payer: Self-pay | Admitting: Internal Medicine

## 2023-06-01 ENCOUNTER — Encounter: Payer: Self-pay | Admitting: Nurse Practitioner

## 2023-06-01 ENCOUNTER — Ambulatory Visit: Payer: 59 | Admitting: Nurse Practitioner

## 2023-06-01 ENCOUNTER — Other Ambulatory Visit: Payer: Self-pay

## 2023-06-01 VITALS — BP 100/58 | HR 66 | Temp 97.4°F | Ht 64.0 in | Wt 186.4 lb

## 2023-06-01 DIAGNOSIS — T3695XA Adverse effect of unspecified systemic antibiotic, initial encounter: Secondary | ICD-10-CM | POA: Insufficient documentation

## 2023-06-01 DIAGNOSIS — J069 Acute upper respiratory infection, unspecified: Secondary | ICD-10-CM | POA: Diagnosis not present

## 2023-06-01 DIAGNOSIS — B379 Candidiasis, unspecified: Secondary | ICD-10-CM

## 2023-06-01 DIAGNOSIS — H1033 Unspecified acute conjunctivitis, bilateral: Secondary | ICD-10-CM | POA: Insufficient documentation

## 2023-06-01 MED ORDER — AMOXICILLIN-POT CLAVULANATE 875-125 MG PO TABS
1.0000 | ORAL_TABLET | Freq: Two times a day (BID) | ORAL | 0 refills | Status: DC
  Filled 2023-06-01: qty 20, 10d supply, fill #0

## 2023-06-01 MED ORDER — FLUCONAZOLE 150 MG PO TABS
ORAL_TABLET | ORAL | 0 refills | Status: DC
  Filled 2023-06-01: qty 2, 3d supply, fill #0

## 2023-06-01 MED ORDER — METHYLPREDNISOLONE 4 MG PO TBPK
ORAL_TABLET | ORAL | 0 refills | Status: AC
  Filled 2023-06-01: qty 21, 6d supply, fill #0

## 2023-06-01 NOTE — Assessment & Plan Note (Signed)
They present with red, crusty, and dry eyes, likely related to the upper respiratory infection. Symptoms should be observed and managed at home, suspect improvement with URI improvement. She will contact if worsening or persisting.

## 2023-06-01 NOTE — Assessment & Plan Note (Signed)
They have experienced persistent cough and congestion for two weeks without shortness of breath, fevers, or productive cough. Over-the-counter Tylenol cold and cough or prescribed Tessalon Perles and Phenergan have not improved symptoms. We will start Augmentin for a suspected bacterial infection and a course of steroids to help with the cough. They should continue with current cough medicine as needed. Encouraged adequate fluid intake. Return precautions given to patient.

## 2023-06-01 NOTE — Assessment & Plan Note (Signed)
They have a history of yeast infections with antibiotic use. We will prescribe prophylactic Diflucan with the antibiotic course.

## 2023-06-01 NOTE — Progress Notes (Signed)
Bethanie Dicker, NP-C Phone: [phone]  Brianna Washington is a 42 y.o. female who presents today for cough and congestion.   Discussed the use of AI scribe software for clinical note transcription with the patient, who gave verbal consent to proceed.  History of Present Illness   The patient, with a history of cough and congestion, presents for a follow-up visit after an e-consultation on November 10th. The cough, which has been ongoing for approximately two weeks, was initially treated with Phenergan and Tessalon Perles. The patient reports that these medications have been somewhat effective.  The cough is described as non-productive, with no associated shortness of breath or fever. However, the patient reports fatigue and headache, which they attribute to the persistent cough. They also report a sensation of congestion in the head and post-nasal drip, which is causing a sore throat.  In addition to the cough and congestion, the patient has noticed redness in their eyes, which were crusty upon waking. They describe the eyes as dry and stinging. The patient denies any changes in taste or smell. A recent COVID-19 test was negative.  The patient has been self-medicating with over-the-counter Tylenol cold and cough medication. They have also been taking the prescribed Tessalon Perles and Phenergan, which they report as helpful.      Social History   Tobacco Use  Smoking Status Former   Current packs/day: 0.00   Types: Cigarettes   Quit date: 09/23/2015   Years since quitting: 7.6  Smokeless Tobacco Never    Current Outpatient Medications on File Prior to Visit  Medication Sig Dispense Refill   ALPRAZolam (XANAX) 0.25 MG tablet Take 1 tablet (0.25 mg total) by mouth at bedtime as needed for anxiety or sleep. 30 tablet 5   benzonatate (TESSALON) 200 MG capsule Take 1 capsule (200 mg total) by mouth 2 (two) times daily as needed for cough. 30 capsule 0   Cholecalciferol (VITAMIN D3 PO) Take  1 capsule by mouth daily.     diphenhydrAMINE (BENADRYL) 25 MG tablet Take 25 mg by mouth every 6 (six) hours as needed.     fexofenadine (ALLEGRA) 60 MG tablet Take 60 mg by mouth daily.      fluticasone (FLONASE) 50 MCG/ACT nasal spray Place 2 sprays into both nostrils daily. 16 g 0   metFORMIN (GLUCOPHAGE-XR) 500 MG 24 hr tablet Take 1 tablet (500 mg total) by mouth daily with breakfast. 90 tablet 1   norethindrone-ethinyl estradiol-FE (JUNEL FE 1/20) 1-20 MG-MCG tablet Take 1 tablet by mouth daily. 84 tablet 3   Omega-3 Fatty Acids (FISH OIL) 1200 MG CAPS Take 1 capsule by mouth daily.     promethazine-dextromethorphan (PROMETHAZINE-DM) 6.25-15 MG/5ML syrup Take 5 mLs by mouth at bedtime. 240 mL 0   propranolol ER (INDERAL LA) 60 MG 24 hr capsule Take 1 capsule (60 mg total) by mouth daily. 90 capsule 1   sertraline (ZOLOFT) 50 MG tablet Take 1 tablet (50 mg total) by mouth daily. 90 tablet 1   traZODone (DESYREL) 50 MG tablet Take 1 tablet (50 mg total) by mouth at bedtime. 90 tablet 3   Turmeric 400 MG CAPS Take 1 capsule by mouth daily.     vitamin C (ASCORBIC ACID) 500 MG tablet Take 500 mg by mouth 2 (two) times daily.     No current facility-administered medications on file prior to visit.    ROS see history of present illness  Objective  Physical Exam Vitals:   06/01/23 1009  BP: Marland Kitchen)  100/58  Pulse: 66  Temp: (!) 97.4 F (36.3 C)  SpO2: 98%    BP Readings from Last 3 Encounters:  06/01/23 (!) 100/58  04/28/23 (!) 102/58  03/20/23 (!) 92/54   Wt Readings from Last 3 Encounters:  06/01/23 186 lb 6.4 oz (84.6 kg)  04/28/23 184 lb 8 oz (83.7 kg)  03/20/23 182 lb 6.4 oz (82.7 kg)    Physical Exam Constitutional:      General: She is not in acute distress.    Appearance: Normal appearance.  HENT:     Head: Normocephalic.     Right Ear: Tympanic membrane normal.     Left Ear: Tympanic membrane normal.     Nose: Congestion present.     Mouth/Throat:     Mouth:  Mucous membranes are moist.     Pharynx: Oropharynx is clear.  Eyes:     General:        Right eye: Discharge present.        Left eye: Discharge present.    Conjunctiva/sclera:     Right eye: Right conjunctiva is injected.     Left eye: Left conjunctiva is injected.     Pupils: Pupils are equal, round, and reactive to light.  Cardiovascular:     Rate and Rhythm: Normal rate and regular rhythm.     Heart sounds: Normal heart sounds.  Pulmonary:     Effort: Pulmonary effort is normal.     Breath sounds: Normal breath sounds.  Lymphadenopathy:     Cervical: No cervical adenopathy.  Skin:    General: Skin is warm and dry.  Neurological:     General: No focal deficit present.     Mental Status: She is alert.  Psychiatric:        Mood and Affect: Mood normal.        Behavior: Behavior normal.    Assessment/Plan: Please see individual problem list.  Upper respiratory tract infection, unspecified type Assessment & Plan: They have experienced persistent cough and congestion for two weeks without shortness of breath, fevers, or productive cough. Over-the-counter Tylenol cold and cough or prescribed Tessalon Perles and Phenergan have not improved symptoms. We will start Augmentin for a suspected bacterial infection and a course of steroids to help with the cough. They should continue with current cough medicine as needed. Encouraged adequate fluid intake. Return precautions given to patient.   Orders: -     Amoxicillin-Pot Clavulanate; Take 1 tablet by mouth 2 (two) times daily for 10 days.  Dispense: 20 tablet; Refill: 0 -     methylPREDNISolone; Take 6 tablets (24 mg total) by mouth daily for 1 day, THEN 5 tablets (20 mg total) daily for 1 day, THEN 4 tablets (16 mg total) daily for 1 day, THEN 3 tablets (12 mg total) daily for 1 day, THEN 2 tablets (8 mg total) daily for 1 day, THEN 1 tablet (4 mg total) daily for 1 day.  Dispense: 21 tablet; Refill: 0  Acute conjunctivitis of both  eyes, unspecified acute conjunctivitis type Assessment & Plan: They present with red, crusty, and dry eyes, likely related to the upper respiratory infection. Symptoms should be observed and managed at home, suspect improvement with URI improvement. She will contact if worsening or persisting.   Antibiotic-induced yeast infection Assessment & Plan: They have a history of yeast infections with antibiotic use. We will prescribe prophylactic Diflucan with the antibiotic course.   Orders: -     Fluconazole; Take 1 tablet  by mouth daily for 1 dose. May repeat in 72 hours if needed.  Dispense: 2 tablet; Refill: 0    Return if symptoms worsen or fail to improve.   Bethanie Dicker, NP-C [facility] [employer]

## 2023-06-05 ENCOUNTER — Other Ambulatory Visit: Payer: Self-pay | Admitting: Obstetrics and Gynecology

## 2023-06-13 ENCOUNTER — Other Ambulatory Visit: Payer: Self-pay | Admitting: Nurse Practitioner

## 2023-06-13 DIAGNOSIS — J069 Acute upper respiratory infection, unspecified: Secondary | ICD-10-CM

## 2023-06-15 NOTE — Telephone Encounter (Signed)
Pt saw Brianna Washington on 06/01/23, but medication was prescribed: 3 weeks ago (05/21/2023) by Claiborne Rigg, NP  See below-awaiting response before refusing/approving medication

## 2023-06-22 ENCOUNTER — Ambulatory Visit: Payer: 59 | Admitting: Internal Medicine

## 2023-06-23 ENCOUNTER — Other Ambulatory Visit: Payer: Self-pay

## 2023-06-23 DIAGNOSIS — H5213 Myopia, bilateral: Secondary | ICD-10-CM | POA: Diagnosis not present

## 2023-06-23 DIAGNOSIS — H524 Presbyopia: Secondary | ICD-10-CM | POA: Diagnosis not present

## 2023-06-23 MED FILL — Alprazolam Tab 0.25 MG: ORAL | 30 days supply | Qty: 30 | Fill #1 | Status: AC

## 2023-07-21 ENCOUNTER — Other Ambulatory Visit: Payer: Self-pay

## 2023-07-25 ENCOUNTER — Ambulatory Visit: Payer: 59 | Admitting: Internal Medicine

## 2023-08-01 ENCOUNTER — Encounter: Payer: Self-pay | Admitting: Obstetrics and Gynecology

## 2023-08-01 DIAGNOSIS — Z1231 Encounter for screening mammogram for malignant neoplasm of breast: Secondary | ICD-10-CM

## 2023-08-02 ENCOUNTER — Other Ambulatory Visit: Payer: Self-pay | Admitting: Obstetrics and Gynecology

## 2023-08-02 DIAGNOSIS — N6489 Other specified disorders of breast: Secondary | ICD-10-CM

## 2023-08-02 DIAGNOSIS — N6012 Diffuse cystic mastopathy of left breast: Secondary | ICD-10-CM

## 2023-08-02 DIAGNOSIS — Z1231 Encounter for screening mammogram for malignant neoplasm of breast: Secondary | ICD-10-CM

## 2023-08-25 ENCOUNTER — Ambulatory Visit
Admission: RE | Admit: 2023-08-25 | Discharge: 2023-08-25 | Disposition: A | Discharge status: Home / Self Care | Payer: BC Managed Care – PPO | Source: Ambulatory Visit | Attending: Obstetrics and Gynecology | Admitting: Obstetrics and Gynecology

## 2023-08-25 ENCOUNTER — Encounter: Payer: Self-pay | Admitting: Obstetrics and Gynecology

## 2023-08-25 DIAGNOSIS — N6012 Diffuse cystic mastopathy of left breast: Secondary | ICD-10-CM

## 2023-08-25 DIAGNOSIS — R92333 Mammographic heterogeneous density, bilateral breasts: Secondary | ICD-10-CM | POA: Diagnosis not present

## 2023-08-25 DIAGNOSIS — N6324 Unspecified lump in the left breast, lower inner quadrant: Secondary | ICD-10-CM | POA: Diagnosis not present

## 2023-08-25 DIAGNOSIS — Z1231 Encounter for screening mammogram for malignant neoplasm of breast: Secondary | ICD-10-CM | POA: Insufficient documentation

## 2023-08-25 DIAGNOSIS — N6489 Other specified disorders of breast: Secondary | ICD-10-CM

## 2023-08-25 DIAGNOSIS — R928 Other abnormal and inconclusive findings on diagnostic imaging of breast: Secondary | ICD-10-CM | POA: Diagnosis not present

## 2023-09-06 ENCOUNTER — Ambulatory Visit: Payer: 59 | Admitting: Internal Medicine

## 2023-09-21 ENCOUNTER — Other Ambulatory Visit: Payer: Self-pay | Admitting: Internal Medicine

## 2023-09-26 ENCOUNTER — Other Ambulatory Visit
Admission: RE | Admit: 2023-09-26 | Discharge: 2023-09-26 | Disposition: A | Discharge status: Home / Self Care | Source: Ambulatory Visit | Attending: Internal Medicine | Admitting: Internal Medicine

## 2023-09-26 DIAGNOSIS — E785 Hyperlipidemia, unspecified: Secondary | ICD-10-CM | POA: Insufficient documentation

## 2023-09-26 LAB — COMPREHENSIVE METABOLIC PANEL
ALT: 14 U/L (ref 0–44)
AST: 16 U/L (ref 15–41)
Albumin: 3.6 g/dL (ref 3.5–5.0)
Alkaline Phosphatase: 36 U/L — ABNORMAL LOW (ref 38–126)
Anion gap: 8 (ref 5–15)
BUN: 12 mg/dL (ref 6–20)
CO2: 22 mmol/L (ref 22–32)
Calcium: 8.9 mg/dL (ref 8.9–10.3)
Chloride: 106 mmol/L (ref 98–111)
Creatinine, Ser: 0.76 mg/dL (ref 0.44–1.00)
GFR, Estimated: >60 mL/min (ref >60)
Glucose, Bld: 114 mg/dL — ABNORMAL HIGH (ref 70–99)
Potassium: 3.6 mmol/L (ref 3.5–5.1)
Sodium: 136 mmol/L (ref 135–145)
Total Bilirubin: 0.9 mg/dL (ref 0.0–1.2)
Total Protein: 6.8 g/dL (ref 6.5–8.1)

## 2023-09-26 LAB — LIPID PANEL
Cholesterol: 176 mg/dL (ref 0–200)
HDL: 48 mg/dL (ref >40)
LDL Cholesterol: 95 mg/dL (ref 0–99)
Total CHOL/HDL Ratio: 3.7 ratio
Triglycerides: 167 mg/dL — ABNORMAL HIGH (ref <150)
VLDL: 33 mg/dL (ref 0–40)

## 2023-09-26 LAB — LDL CHOLESTEROL, DIRECT: Direct LDL: 109 mg/dL — ABNORMAL HIGH (ref 0–99)

## 2023-09-26 MED ORDER — TURMERIC 400 MG PO CAPS
1.0000 | ORAL_CAPSULE | Freq: Every day | ORAL | 1 refills | Status: AC
  Filled 2023-09-26: qty 90, fill #0

## 2023-09-27 ENCOUNTER — Other Ambulatory Visit: Payer: Self-pay

## 2023-09-28 ENCOUNTER — Encounter: Payer: Self-pay | Admitting: Internal Medicine

## 2023-09-29 ENCOUNTER — Telehealth: Payer: Self-pay

## 2023-09-29 ENCOUNTER — Other Ambulatory Visit: Payer: Self-pay

## 2023-09-29 ENCOUNTER — Ambulatory Visit: Payer: 59 | Admitting: Internal Medicine

## 2023-09-29 ENCOUNTER — Encounter: Payer: Self-pay | Admitting: Internal Medicine

## 2023-09-29 VITALS — BP 110/70 | HR 68 | Wt 188.0 lb

## 2023-09-29 DIAGNOSIS — E663 Overweight: Secondary | ICD-10-CM

## 2023-09-29 MED ORDER — PROPRANOLOL HCL ER 60 MG PO CP24
60.0000 mg | ORAL_CAPSULE | Freq: Every day | ORAL | 1 refills | Status: DC
  Filled 2023-09-29 – 2023-10-16 (×3): qty 90, 90d supply, fill #0
  Filled 2024-01-25: qty 90, 90d supply, fill #1

## 2023-09-29 MED ORDER — SERTRALINE HCL 50 MG PO TABS
50.0000 mg | ORAL_TABLET | Freq: Every day | ORAL | 1 refills | Status: DC
  Filled 2023-09-29 – 2023-10-16 (×2): qty 90, 90d supply, fill #0
  Filled 2024-01-25: qty 90, 90d supply, fill #1

## 2023-09-29 MED ORDER — WEGOVY 0.25 MG/0.5ML ~~LOC~~ SOAJ: 0.2500 mg | SUBCUTANEOUS | Status: DC

## 2023-09-29 MED ORDER — TRAZODONE HCL 50 MG PO TABS
50.0000 mg | ORAL_TABLET | Freq: Every day | ORAL | 3 refills | Status: AC
  Filled 2023-09-29 – 2023-12-24 (×2): qty 90, 90d supply, fill #0
  Filled 2024-03-21: qty 90, 90d supply, fill #1
  Filled 2024-06-20: qty 90, 90d supply, fill #2

## 2023-09-29 NOTE — Telephone Encounter (Signed)
 Medication Samples have been provided to the patient.  Drug name: [id number]       Strength: 0.25 mg        Qty: 3 boxes  LOT: [id number]  [id number]  Exp.Date: 12/08/2023  06/09/2024  Dosing instructions: Inject 0.25 mg into skin once weekly.   The patient has been instructed regarding the correct time, dose, and frequency of taking this medication, including desired effects and most common side effects.   Brianna Washington 4:47 PM 09/29/2023

## 2023-09-29 NOTE — Patient Instructions (Signed)
 Here are some local and online Sources for zepbound and [phone] (direct pay)   [facility]ASH PA[facility] FOR ZEPBOUND VIAL [place], [place] - [address]  Isaac Bliss  Drug store in [place] makes a compounded version of semiglutide (WEGOVY)   Delrae Rend MD in [place] also provides medication through a weight management program

## 2023-09-29 NOTE — Progress Notes (Signed)
 Subjective:  Patient ID: Brianna Washington, female    DOB: Dec 28, 1980  Age: 43 y.o. MRN: [id number]  CC: The encounter diagnosis was Overweight (BMI 25.0-29.9).   HPI Brianna Washington presents for  Chief Complaint  Patient presents with   Medical Management of Chronic Issues   OBESITY: she has been unable to lose weight despite eating a careful diet . She is working full time, completing her Bachelor's degree on line and raising two children .  She has not been exercising more than 2 times per week .   She has been taking metformin to tyr to suppress her appetite.   She would like to resume a more effective  appetite suppressant but her insurance would not pay for GLP 1 agonist for weight loss      Outpatient Medications Prior to Visit  Medication Sig Dispense Refill   ALPRAZolam (XANAX) 0.25 MG tablet Take 1 tablet (0.25 mg total) by mouth at bedtime as needed for anxiety or sleep. 30 tablet 5   B Complex-C (VITAMIN B + C COMPLEX PO)      Cholecalciferol (VITAMIN D3 PO) Take 1 capsule by mouth daily.     diphenhydrAMINE (BENADRYL) 25 MG tablet Take 25 mg by mouth every 6 (six) hours as needed.     fexofenadine (ALLEGRA) 60 MG tablet Take 60 mg by mouth daily.      fluticasone (FLONASE) 50 MCG/ACT nasal spray Place 2 sprays into both nostrils daily. 16 g 0   metFORMIN (GLUCOPHAGE-XR) 500 MG 24 hr tablet Take 1 tablet (500 mg total) by mouth daily with breakfast. 90 tablet 1   norethindrone-ethinyl estradiol-FE (JUNEL FE 1/20) 1-20 MG-MCG tablet Take 1 tablet by mouth daily. 84 tablet 3   Omega-3 Fatty Acids (FISH OIL) 1200 MG CAPS Take 1 capsule by mouth daily.     Turmeric 400 MG CAPS Take 1 capsule by mouth daily. 90 capsule 1   vitamin C (ASCORBIC ACID) 500 MG tablet Take 500 mg by mouth 2 (two) times daily.     propranolol ER (INDERAL LA) 60 MG 24 hr capsule Take 1 capsule (60 mg total) by mouth daily. 90 capsule 1   sertraline (ZOLOFT) 50 MG tablet Take 1 tablet (50 mg total)  by mouth daily. 90 tablet 1   traZODone (DESYREL) 50 MG tablet Take 1 tablet (50 mg total) by mouth at bedtime. 90 tablet 3   amoxicillin-clavulanate (AUGMENTIN) 875-125 MG tablet Take 1 tablet by mouth 2 (two) times daily for 10 days. (Patient not taking: Reported on 09/29/2023) 20 tablet 0   benzonatate (TESSALON) 200 MG capsule Take 1 capsule (200 mg total) by mouth 2 (two) times daily as needed for cough. (Patient not taking: Reported on 09/29/2023) 30 capsule 0   fluconazole (DIFLUCAN) 150 MG tablet Take 1 tablet by mouth daily for 1 dose. May repeat in 72 hours if needed. (Patient not taking: Reported on 09/29/2023) 2 tablet 0   promethazine-dextromethorphan (PROMETHAZINE-DM) 6.25-15 MG/5ML syrup Take 5 mLs by mouth at bedtime. (Patient not taking: Reported on 09/29/2023) 240 mL 0   No facility-administered medications prior to visit.    Review of Systems;  Patient denies headache, fevers, malaise, unintentional weight loss, skin rash, eye pain, sinus congestion and sinus pain, sore throat, dysphagia,  hemoptysis , cough, dyspnea, wheezing, chest pain, palpitations, orthopnea, edema, abdominal pain, nausea, melena, diarrhea, constipation, flank pain, dysuria, hematuria, urinary  Frequency, nocturia, numbness, tingling, seizures,  Focal weakness, Loss of consciousness,  Tremor, insomnia, depression, anxiety, and suicidal ideation.      Objective:  BP 110/70   Pulse 68   Wt 188 lb (85.3 kg)   BMI 32.27 kg/m   BP Readings from Last 3 Encounters:  09/29/23 110/70  06/01/23 (!) 100/58  04/28/23 (!) 102/58    Wt Readings from Last 3 Encounters:  09/29/23 188 lb (85.3 kg)  06/01/23 186 lb 6.4 oz (84.6 kg)  04/28/23 184 lb 8 oz (83.7 kg)    Physical Exam Vitals reviewed.  Constitutional:      General: She is not in acute distress.    Appearance: Normal appearance. She is normal weight. She is not ill-appearing, toxic-appearing or diaphoretic.  HENT:     Head: Normocephalic.  Eyes:      General: No scleral icterus.       Right eye: No discharge.        Left eye: No discharge.     Conjunctiva/sclera: Conjunctivae normal.  Cardiovascular:     Rate and Rhythm: Normal rate and regular rhythm.     Heart sounds: Normal heart sounds.  Pulmonary:     Effort: Pulmonary effort is normal. No respiratory distress.     Breath sounds: Normal breath sounds.  Musculoskeletal:        General: Normal range of motion.  Skin:    General: Skin is warm and dry.  Neurological:     General: No focal deficit present.     Mental Status: She is alert and oriented to person, place, and time. Mental status is at baseline.  Psychiatric:        Mood and Affect: Mood normal.        Behavior: Behavior normal.        Thought Content: Thought content normal.        Judgment: Judgment normal.    Lab Results  Component Value Date   HGBA1C 5.5 06/14/2022   HGBA1C 4.9 05/09/2016    Lab Results  Component Value Date   CREATININE 0.76 09/26/2023   CREATININE 0.76 06/14/2022   CREATININE 0.76 01/06/2021    Lab Results  Component Value Date   WBC 5.5 06/14/2022   HGB 12.6 06/14/2022   HCT 37.4 06/14/2022   PLT 237.0 06/14/2022   GLUCOSE 114 (H) 09/26/2023   CHOL 176 09/26/2023   TRIG 167 (H) 09/26/2023   HDL 48 09/26/2023   LDLDIRECT 109 (H) 09/26/2023   LDLCALC 95 09/26/2023   ALT 14 09/26/2023   AST 16 09/26/2023   NA 136 09/26/2023   K 3.6 09/26/2023   CL 106 09/26/2023   CREATININE 0.76 09/26/2023   BUN 12 09/26/2023   CO2 22 09/26/2023   TSH 1.07 06/14/2022   HGBA1C 5.5 06/14/2022    No results found.  Assessment & Plan:  .Overweight (BMI 25.0-29.9) Assessment & Plan: Patient has been unable to lose or maintain a healthy weight despite good effort. Encouraged to increase exercise involvement to include  regular aerobic activity for 30 minutes 5 days per week.  Screened for contraindications to use of  GLP 1 agonists for appetite suppression and she has none.  The  risks and benefits of pharmacotherapy discussed and she is requesting a trial of therapy .  Samples of Mounjaro  give for 3 months     Other orders -     Propranolol HCl ER; Take 1 capsule (60 mg total) by mouth daily.  Dispense: 90 capsule; Refill: 1 -     Sertraline  HCl; Take 1 tablet (50 mg total) by mouth daily.  Dispense: 90 tablet; Refill: 1 -     traZODone HCl; Take 1 tablet (50 mg total) by mouth at bedtime.  Dispense: 90 tablet; Refill: 3 -     Wegovy; Inject 0.25 mg into the skin once a week.     Follow-up: Return in about 6 months (around 03/31/2024).   Sherlene Shams, MD

## 2023-10-01 NOTE — Assessment & Plan Note (Signed)
 Patient has been unable to lose or maintain a healthy weight despite good effort. Encouraged to increase exercise involvement to include  regular aerobic activity for 30 minutes 5 days per week.  Screened for contraindications to use of  GLP 1 agonists for appetite suppression and she has none.  The risks and benefits of pharmacotherapy discussed and she is requesting a trial of therapy .  Samples of Mounjaro  give for 3 months

## 2023-10-04 ENCOUNTER — Other Ambulatory Visit: Payer: Self-pay

## 2023-10-06 ENCOUNTER — Other Ambulatory Visit: Payer: Self-pay

## 2023-10-16 ENCOUNTER — Other Ambulatory Visit: Payer: Self-pay

## 2023-11-29 ENCOUNTER — Other Ambulatory Visit: Payer: Self-pay | Admitting: Internal Medicine

## 2023-11-29 NOTE — Telephone Encounter (Signed)
 Refilled: 03/17/2023 Last OV: 09/29/2023 Next OV:  04/08/2024

## 2023-11-30 ENCOUNTER — Other Ambulatory Visit: Payer: Self-pay

## 2023-11-30 MED ORDER — ALPRAZOLAM 0.25 MG PO TABS
0.2500 mg | ORAL_TABLET | Freq: Every evening | ORAL | 3 refills | Status: AC | PRN
  Filled 2023-11-30: qty 30, 30d supply, fill #0
  Filled 2023-12-24 – 2023-12-29 (×2): qty 30, 30d supply, fill #1
  Filled 2024-03-21: qty 30, 30d supply, fill #2

## 2023-12-08 ENCOUNTER — Telehealth: Admitting: Family Medicine

## 2023-12-08 DIAGNOSIS — T25229A Burn of second degree of unspecified foot, initial encounter: Secondary | ICD-10-CM | POA: Diagnosis not present

## 2023-12-08 MED ORDER — MUPIROCIN 2 % EX OINT: 1.0000 | TOPICAL_OINTMENT | Freq: Two times a day (BID) | CUTANEOUS | 0 refills | Status: AC

## 2023-12-08 NOTE — Progress Notes (Signed)
 E-Visit for Burn  We are sorry that you are not feeling well. Here is how we plan to help!  Based on what you have shared with me it looks like you may have:  I have prescribed Mupirocin 2% ointment.  Apply with gloves to the affected area two times per day for 14 days.  Apply a non-stick dressing such as Telfa to the site after you apply the cream.  You may hold the dressing in place with either paper tape or self adhering wrap such as Coban.  Product similar to Telfa and Coban can be bought at any pharmacy.  If you have a question ask your pharmacist.  Donnette Gal are a type of painful wound caused by thermal, electrical, chemical, or electromagnetic energy.  Smoking and open flame are the leading cause of burn injury for older adults.  Scalding from a hot liquid is the leading cause of burn injury for children.  Both infants and older adults are the greatest risk for burn injury.  First degree burns effect only the outer layers of the skin.  The burn may be red and painful but the skin does not blister.  Long term tissue damage is rare.  Second degree burns involve the surface of the skin and the adjacent skin layers.  The burn sire also appears red and painful and the skin often swells and/or blisters.  Third degree burns destroy both layers of the skin and can also penetrate to underlying  Structures.  A third degree burn may not initially hurt because nerve endings were destroyed.  All third degree burns should be evaluated in person.  HOME CARE:  Wash the area gently with soap and water once a day Apply antibiotic ointment directly to a Band-Aid or dressing and apply Band-Aid or dressing over the burn.  Change dressing every other day.  Use warm water and 1 or 2 wipes with a wet washcloth to remove any surface debris.  Some of the newer antibiotic ointments contain lidocaine  that can help to control the localized pain of the burn. You should leave intact blisters alone for the first 7 days.   After a week you may gently remove blisters.  The easiest way to do this is gently wipe away the dead skin with wet gauze or wet washcloth. If that fails you may carefully trim off the dead skin with a pair of fine scissors.  Be sure to clean the scissors in alcohol before use.  GET HELP RIGHT AWAY IF:  The area of the burn is larger than 4 palms of our hand. You become short of breath. The site looks infected. Your symptoms persist after you have completed your treatment plan. The burn has not healed in 14 days.   MAKE SURE YOU:  Understand these instructions. Will watch your condition. Will get help right away if you are not doing well or get worse.  Your e-visit answers were reviewed by a board certified advanced clinical practitioner to complete your personal care plan.  Depending upon the condition, your plan could have included both over the counter or prescription medications.    Please review your pharmacy choice.  Make sure the pharmacy is open so you can pick up prescription now.   If there is a problem, you may contact your provider through [employer] and have the prescription routed to another pharmacy. Your safety is important to us .  If you have drug allergies check your prescription carefully.    For the  next 24 hours you can use MyChart to ask questions about today's visit, request a non-urgent call back, or ask for a work or school excuse.  You will get an email with a link to our survey asking about your experience.  I hope that your e-visit has been valuable and will speed your recovery.     Thank you for using e-Visits.    have provided 5 minutes of non face to face time during this encounter for chart review and documentation.

## 2023-12-24 ENCOUNTER — Other Ambulatory Visit: Payer: Self-pay

## 2023-12-25 ENCOUNTER — Other Ambulatory Visit: Payer: Self-pay

## 2023-12-28 ENCOUNTER — Encounter: Payer: Self-pay | Admitting: Internal Medicine

## 2023-12-29 ENCOUNTER — Ambulatory Visit
Admission: EM | Admit: 2023-12-29 | Discharge: 2023-12-29 | Disposition: A | Discharge status: Home / Self Care | Attending: Emergency Medicine | Admitting: Emergency Medicine

## 2023-12-29 ENCOUNTER — Other Ambulatory Visit: Payer: Self-pay

## 2023-12-29 ENCOUNTER — Ambulatory Visit: Payer: Self-pay | Admitting: Internal Medicine

## 2023-12-29 DIAGNOSIS — R519 Headache, unspecified: Secondary | ICD-10-CM

## 2023-12-29 MED ORDER — METOCLOPRAMIDE HCL 5 MG/ML IJ SOLN
10.0000 mg | Freq: Once | INTRAMUSCULAR | Status: AC
  Administered 2023-12-29: 10 mg via INTRAMUSCULAR

## 2023-12-29 MED ORDER — KETOROLAC TROMETHAMINE 30 MG/ML IJ SOLN
30.0000 mg | Freq: Once | INTRAMUSCULAR | Status: AC
Start: 2023-12-29 — End: 2023-12-29
  Administered 2023-12-29: 30 mg via INTRAMUSCULAR

## 2023-12-29 MED ORDER — DEXAMETHASONE SODIUM PHOSPHATE 10 MG/ML IJ SOLN
10.0000 mg | Freq: Once | INTRAMUSCULAR | Status: AC
  Administered 2023-12-29: 10 mg via INTRAMUSCULAR

## 2023-12-29 NOTE — Telephone Encounter (Signed)
 FYI Only or Action Required?: FYI only for provider.  Patient was last seen in primary care on 09/29/2023 by Thersia Flax, MD. Called Nurse Triage reporting Headache. Symptoms began yesterday. Interventions attempted: OTC medications: Tylenol  and ibuprofen . Symptoms are: unchanged.  Triage Disposition: See Physician Within 24 Hours  Patient/caregiver understands and will follow disposition?: Yes                             Copied from CRM [id number]. Topic: Clinical - Red Word Triage >> Dec 29, 2023  2:37 PM Pam Bode wrote: Red Word that prompted transfer to Nurse Triage: Patient is having an ongoing severe headache and nausea going on for the last 24 hours. Reason for Disposition  [1] MODERATE headache (e.g., interferes with normal activities) AND [2] present > 24 hours AND [3] unexplained  (Exceptions: analgesics not tried, typical migraine, or headache part of viral illness)  Answer Assessment - Initial Assessment Questions This RN recommends patient goes to an urgent care within 24 hours based off symptoms and no appointment availability in office. Patient agreeable. This RN educated pt on  new-worsening symptoms and when to call back/seek emergent care. Pt verbalized understanding and agrees to plan.     Headache started yesterday afternoon, constant, 4-5/10 pain level Location- mostly above right eye and goes towards back of head Stabbing pain  Intermittent nausea Took tylenol  migraine relief and ibuprofen ; it helped some but has not gone away completely Pt states in the last 3 weeks she has gotten a bad headache once a week, usually on Thurs of Fri; the two before, pt had a fragmented glass visual change    MIGRAINE: Have you been diagnosed with migraine headaches? If Yes, ask: Is this headache similar?      No  HEAD INJURY: Has there been any recent injury to the head?      No  OTHER SYMPTOMS: Do you have any other symptoms? (fever,  stiff neck, eye pain, sore throat, cold symptoms)     No, pain above eye  Protocols used: Headache-A-AH

## 2023-12-29 NOTE — Discharge Instructions (Signed)
 For your headache  -On exam there are no abnormalities neurologically -You have been given an injection of Toradol , Reglan  and Decadron here in the office today to help minimize your symptoms -You may continue use of ibuprofen , tylenol , Excedrin for management at home -While headaches are present ensure that you are getting adequate rest and adequate fluid intake -Participate in low stimulation activities avoiding bright lights and loud noises when symptoms are present -If your headaches continue to persist please follow-up with your primary doctor for reevaluation -At any point if you have the worst headache of your life please go to the nearest emergency department for immediate evaluation

## 2023-12-29 NOTE — Telephone Encounter (Signed)
 Pt was advised to be seen at [facility] and was agreeable

## 2023-12-29 NOTE — ED Provider Notes (Signed)
 Brianna Washington    CSN: [id number] Arrival date & time: 12/29/23  1511      History   Chief Complaint Chief Complaint  Patient presents with   Headache    HPI Brianna Washington is a 43 y.o. female.    Patient presents for evaluation of persisting headache beginning yesterday.  Has had a headache occurring every week for the last 3 weeks, typically beginning in the evening resolving by morning but did not do so this time.  Headache is to the right frontal aspect behind the eye, described as a stabbing pain.  Has experienced fragmented vision changes with the prior to headaches but began to experience light sensitivity and nausea without vomiting yesterday.  Affected by loud noise.  Denies dizziness, lightheadedness, syncope, head injury or trauma.  Has attempted use of Tylenol  and ibuprofen  but Excedrin most effective.  Has not occurred before.  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   Dietary iron deficiency 08/18/2018   Dystrophic nail 08/18/2018   Tobacco abuse counseling 08/18/2018   Overweight (BMI 25.0-29.9) 06/12/2016   Encounter for preventive health examination 06/12/2016   History of tobacco abuse 06/09/2016   Fatigue 03/01/2016   Essential tremor 08/02/2012   Allergic rhinitis 11/19/2008    Past Surgical History:  Procedure Laterality Date   TONSILLECTOMY  2010    OB History     Gravida  2   Para  2   Term  2   Preterm  0   AB  0   Living  1      SAB  0   IAB  0   Ectopic  0    Multiple  0   Live Births  1            Home Medications    Prior to Admission medications   Medication Sig Start Date End Date Taking? Authorizing Provider  ALPRAZolam  (XANAX ) 0.25 MG tablet Take 1 tablet (0.25 mg total) by mouth at bedtime as needed for anxiety or sleep. 11/30/23   Thersia Flax, MD  B Complex-C (VITAMIN B + C COMPLEX PO)     [provider]  Cholecalciferol (VITAMIN D3 PO) Take 1 capsule by mouth daily.    [provider]  diphenhydrAMINE  (BENADRYL ) 25 MG tablet Take 25 mg by mouth every 6 (six) hours as needed.    [provider]  fexofenadine (ALLEGRA) 60 MG tablet Take 60 mg by mouth daily.     [provider]  fluticasone  (FLONASE ) 50 MCG/ACT nasal spray Place 2 sprays into both nostrils daily. 05/21/23   Fleming, Zelda W, NP  metFORMIN  (GLUCOPHAGE -XR) 500 MG 24 hr tablet Take 1 tablet (500 mg total) by mouth daily with breakfast. Patient not taking: Reported on 12/29/2023 03/20/23   Thersia Flax, MD  norethindrone -ethinyl estradiol -FE (JUNEL  FE 1/20) 1-20 MG-MCG tablet Take 1 tablet by mouth daily. 04/28/23   Teresa Fender, MD  Omega-3 Fatty Acids (FISH OIL) 1200 MG CAPS Take  1 capsule by mouth daily.    [provider]  propranolol  ER (INDERAL  LA) 60 MG 24 hr capsule Take 1 capsule (60 mg total) by mouth daily. 09/29/23   Thersia Flax, MD  Semaglutide -Weight Management (WEGOVY ) 0.25 MG/0.5ML SOAJ Inject 0.25 mg into the skin once a week. 09/29/23   Thersia Flax, MD  sertraline  (ZOLOFT ) 50 MG tablet Take 1 tablet (50 mg total) by mouth daily. 09/29/23   Thersia Flax, MD  traZODone  (DESYREL ) 50 MG tablet Take 1 tablet (50 mg total) by mouth at bedtime. 09/29/23   Tullo, Teresa L, MD  Turmeric 400 MG CAPS Take 1 capsule by mouth daily. 09/26/23   Thersia Flax, MD  vitamin C (ASCORBIC ACID) 500 MG tablet Take 500 mg by mouth 2 (two) times daily.    [provider]    Family History Family History   Problem Relation Age of Onset   Hyperlipidemia Mother    Hypertension Mother    Hyperthyroidism Mother    Hyperlipidemia Father    Breast cancer Maternal Grandmother    Cancer Maternal Grandmother 3       metastatic breast ca    Heart attack Maternal Grandfather    Heart disease Maternal Grandfather        AMI   Cancer Paternal Grandmother        NOn-Hodgkins Lymphoma   Diabetes Paternal Grandmother    Heart disease Paternal Grandfather     Social History Social History   Tobacco Use   Smoking status: Former    Current packs/day: 0.00    Types: Cigarettes    Quit date: 09/23/2015    Years since quitting: 8.2   Smokeless tobacco: Never  Vaping Use   Vaping status: Never Used  Substance Use Topics   Alcohol use: Yes    Comment: daily   Drug use: No     Allergies   Patient has no known allergies.   Review of Systems Review of Systems   Physical Exam Triage Vital Signs ED Triage Vitals  Encounter Vitals Group     BP 12/29/23 1551 118/75     Girls Systolic BP Percentile --      Girls Diastolic BP Percentile --      Boys Systolic BP Percentile --      Boys Diastolic BP Percentile --      Pulse Rate 12/29/23 1551 76     Resp 12/29/23 1551 19     Temp 12/29/23 1551 98.4 F (36.9 C)     Temp Source 12/29/23 1551 Oral     SpO2 12/29/23 1551 96 %     Weight --      Height --      Head Circumference --      Peak Flow --      Pain Score 12/29/23 1552 5     Pain Loc --      Pain Education --      Exclude from Growth Chart --    No data found.  Updated Vital Signs BP 118/75 (BP Location: Right Arm)   Pulse 76   Temp 98.4 F (36.9 C) (Oral)   Resp 19   LMP 12/06/2023 (Approximate)   SpO2 96%   Visual Acuity Right Eye Distance:   Left Eye Distance:   Bilateral Distance:    Right Eye Near:   Left Eye Near:    Bilateral Near:     Physical Exam Constitutional:  Appearance: Normal appearance.   Eyes:     Extraocular Movements: Extraocular  movements intact.     Conjunctiva/sclera: Conjunctivae normal.     Pupils: Pupils are equal, round, and reactive to light.   Pulmonary:     Effort: Pulmonary effort is normal.   Neurological:     General: No focal deficit present.     Mental Status: She is alert and oriented to person, place, and time. Mental status is at baseline.      UC Treatments / Results  Labs (all labs ordered are listed, but only abnormal results are displayed) Labs Reviewed - No data to display  EKG   Radiology No results found.  Procedures Procedures (including critical care time)  Medications Ordered in UC Medications  ketorolac  (TORADOL ) 30 MG/ML injection 30 mg (has no administration in time range)  dexamethasone (DECADRON) injection 10 mg (has no administration in time range)  metoCLOPramide  (REGLAN ) injection 10 mg (has no administration in time range)    Initial Impression / Assessment and Plan / UC Course  I have reviewed the triage vital signs and the nursing notes.  Pertinent labs & imaging results that were available during my care of the patient were reviewed by me and considered in my medical decision making (see chart for details).  Bad headache  Vital signs stable, patient in no signs of distress nor toxic appearing, low suspicion for neurological changes, no deficits on exam, stable, possibly related to seasonal weather change and frequent raining, discussed, Toradol , Decadron and Reglan  IM injections given, recommended continue use of over-the-counter analgesics and advised nonpharmacological supportive care for persisting symptoms patient to follow-up with primary doctor however for a worsening headache in severity she is to go to the nearest emergency department, verbalized understanding Final Clinical Impressions(s) / UC Diagnoses   Final diagnoses:  Bad headache     Discharge Instructions      For your headache  -On exam there are no abnormalities  neurologically -You have been given an injection of Toradol , Reglan  and Decadron here in the office today to help minimize your symptoms -You may continue use of ibuprofen , tylenol , Excedrin for management at home -While headaches are present ensure that you are getting adequate rest and adequate fluid intake -Participate in low stimulation activities avoiding bright lights and loud noises when symptoms are present -If your headaches continue to persist please follow-up with your primary doctor for reevaluation -At any point if you have the worst headache of your life please go to the nearest emergency department for immediate evaluation    ED Prescriptions   None    PDMP not reviewed this encounter.   Reena Canning, [place] 12/29/23 [phone]

## 2023-12-29 NOTE — ED Triage Notes (Signed)
 Pt being seen in UC for headache with nausea and photo-sensitivity since yesterday. Pt reports headaches have been occurring intermittently x3 weeks. Pt denies blurry vision.

## 2024-01-04 NOTE — Telephone Encounter (Signed)
 Spoke with pt and scheduled her to see Vincente, NP next week. Pt gave a verbal understanding.

## 2024-01-11 ENCOUNTER — Ambulatory Visit: Admitting: Nurse Practitioner

## 2024-01-11 ENCOUNTER — Telehealth: Payer: Self-pay

## 2024-01-11 ENCOUNTER — Encounter: Payer: Self-pay | Admitting: Nurse Practitioner

## 2024-01-11 VITALS — BP 122/76 | HR 85 | Temp 98.1°F | Ht 64.0 in | Wt 194.8 lb

## 2024-01-11 DIAGNOSIS — R519 Headache, unspecified: Secondary | ICD-10-CM

## 2024-01-11 MED ORDER — NURTEC 75 MG PO TBDP: 75.0000 mg | ORAL_TABLET | ORAL | Status: AC | PRN

## 2024-01-11 NOTE — Telephone Encounter (Signed)
 Medication Samples have been provided to the patient.  Drug name: Nurtec ODT       Strength: 75 mg        Qty: 2 boxes  LOT: [id number]  Exp.Date: 01/07/2025  Dosing instructions: Take 1 tablet daily as needed  The patient has been instructed regarding the correct time, dose, and frequency of taking this medication, including desired effects and most common side effects.   Brianna Washington 12:56 PM 01/11/2024

## 2024-01-11 NOTE — Patient Instructions (Signed)
 VISIT SUMMARY:  You visited us  today due to persistent headaches that have been occurring daily, along with visual disturbances and nausea. We discussed your current medications and supplements, and we have provided a plan to help manage your symptoms.  YOUR PLAN:  MIGRAINE HEADACHES: You have been experiencing daily headaches with visual disturbances and nausea. There is a family history of ocular migraines. -We recommend a neurology evaluation to further assess your headaches. -Increase your magnesium glycinate intake to 400 mg daily. -We have provided you with a sample of Nurtec for acute headache management. -Continue with good hydration, stress management, and getting adequate sleep.

## 2024-02-01 DIAGNOSIS — R519 Headache, unspecified: Secondary | ICD-10-CM | POA: Insufficient documentation

## 2024-02-01 NOTE — Progress Notes (Signed)
 Established Patient Office Visit  Subjective:  Patient ID: Brianna Washington, female    DOB: 1980-10-24  Age: 43 y.o. MRN: [id number]  CC:  Chief Complaint  Patient presents with   Headache    Headaches x several weeks Pain 4-5/10 Some nausea with the headaches   Discussed the use of a AI scribe software for clinical note transcription with the patient, who gave verbal consent to proceed.  HPI  Brianna Washington presents for Brianna Washington is a 43 year old female who presents with persistent headaches.  Headaches began in May, initially weekly, now occurring daily. They are mild, lasting one to two days, located in the frontal and occipital regions. She experiences a sensation of 'fractured glass' across her vision twice, with associated nausea and blurry vision, not severe enough to impair reading. Relief is found by lying in a dark room and using a cold migraine cap.  She has tried Excedrin, phenylephrine, and Tylenol  Cold and Flu with some relief. She switched from Allegra to Xyzal, taken daily, and takes propranolol  60 mg at night for tremors. Recently started magnesium and K2 supplements after headache onset.  Family history includes a brother with possible ocular migraines. She experiences stress from work, caring for young children, and attending school for her BS degree. 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  She sleeps well, averaging at least seven hours per night, and maintains good hydration. No significant dietary changes noted. No numbness, tingling, or weakness with headaches. Nausea and blurry vision occur with some headaches, with light and sound sensitivity when severe.     Past Medical History:  Diagnosis Date   Allergy    Depression    Tremors of nervous system    Essential Tremors (labetalol )    Past Surgical History:  Procedure Laterality Date   TONSILLECTOMY  2010    Family History  Problem  Relation Age of Onset   Hyperlipidemia Mother    Hypertension Mother    Hyperthyroidism Mother    Hyperlipidemia Father    Breast cancer Maternal Grandmother    Cancer Maternal Grandmother 95       metastatic breast ca    Heart attack Maternal Grandfather    Heart disease Maternal Grandfather        AMI   Cancer Paternal Grandmother        NOn-Hodgkins Lymphoma   Diabetes Paternal Grandmother    Heart disease Paternal Grandfather     Social History   Socioeconomic History   Marital status: Married    Spouse name: Not on file   Number of children: Not on file   Years of education: Not on file   Highest education level: Associate degree: academic program  Occupational History   Not on file  Tobacco Use   Smoking status: Former    Current packs/day: 0.00    Types: Cigarettes    Quit date: 09/23/2015    Years since quitting: 8.3   Smokeless tobacco: Never  Vaping Use   Vaping status: Never Used  Substance and Sexual Activity   Alcohol use: Yes    Comment: daily   Drug use: No   Sexual activity: Yes    Birth control/protection: I.U.D.  Other Topics Concern   Not on file  Social History Narrative   2 kids    Works PAC[employer]   Social Drivers of Health   Financial Resource Strain: Low Risk  (01/09/2024)  Overall Financial Resource Strain (CARDIA)    Difficulty of Paying Living Expenses: Not hard at all  Food Insecurity: No Food Insecurity (01/09/2024)   Hunger Vital Sign    Worried About Running Out of Food in the Last Year: Never true    Ran Out of Food in the Last Year: Never true  Transportation Needs: No Transportation Needs (01/09/2024)   PRAPARE - Administrator, Civil Service (Medical): No    Lack of Transportation (Non-Medical): No  Physical Activity: Insufficiently Active (01/09/2024)   Exercise Vital Sign    Days of Exercise per Week: 2 days    Minutes of Exercise per Session: 30 min  Stress: Stress Concern Present (01/09/2024)   [employer]  of Occupational Health - Occupational Stress Questionnaire    Feeling of Stress: Rather much  Social Connections: Socially Integrated (01/09/2024)   Social Connection and Isolation Panel    Frequency of Communication with Friends and Family: More than three times a week    Frequency of Social Gatherings with Friends and Family: Once a week    Attends Religious Services: 1 to 4 times per year    Active Member of [employer] or Organizations: Yes    Attends Banker Meetings: 1 to 4 times per year    Marital Status: Married  Catering manager Violence: Not on file     Outpatient Medications Prior to Visit  Medication Sig Dispense Refill   ALPRAZolam  (XANAX ) 0.25 MG tablet Take 1 tablet (0.25 mg total) by mouth at bedtime as needed for anxiety or sleep. 30 tablet 3   B Complex-C (VITAMIN B + C COMPLEX PO)      Cholecalciferol (VITAMIN D3 PO) Take 1 capsule by mouth daily.     diphenhydrAMINE  (BENADRYL ) 25 MG tablet Take 25 mg by mouth every 6 (six) hours as needed.     fluticasone  (FLONASE ) 50 MCG/ACT nasal spray Place 2 sprays into both nostrils daily. 16 g 0   levocetirizine (XYZAL) 5 MG tablet Take 5 mg by mouth every evening.     norethindrone -ethinyl estradiol -FE (JUNEL  FE 1/20) 1-20 MG-MCG tablet Take 1 tablet by mouth daily. 84 tablet 3   Omega-3 Fatty Acids (FISH OIL) 1200 MG CAPS Take 1 capsule by mouth daily.     propranolol  ER (INDERAL  LA) 60 MG 24 hr capsule Take 1 capsule (60 mg total) by mouth daily. 90 capsule 1   Semaglutide -Weight Management (WEGOVY ) 0.25 MG/0.5ML SOAJ Inject 0.25 mg into the skin once a week.     sertraline  (ZOLOFT ) 50 MG tablet Take 1 tablet (50 mg total) by mouth daily. 90 tablet 1   traZODone  (DESYREL ) 50 MG tablet Take 1 tablet (50 mg total) by mouth at bedtime. 90 tablet 3   Turmeric 400 MG CAPS Take 1 capsule by mouth daily. 90 capsule 1   vitamin C (ASCORBIC ACID) 500 MG tablet Take 500 mg by mouth 2 (two) times daily.     fexofenadine (ALLEGRA)  60 MG tablet Take 60 mg by mouth daily.  (Patient not taking: Reported on 01/11/2024)     metFORMIN  (GLUCOPHAGE -XR) 500 MG 24 hr tablet Take 1 tablet (500 mg total) by mouth daily with breakfast. (Patient not taking: Reported on 12/29/2023) 90 tablet 1   No facility-administered medications prior to visit.    No Known Allergies  ROS Review of Systems  Neurological:  Positive for headaches.   Negative unless indicated in HPI.    Objective:    Physical  Exam Constitutional:      Appearance: Normal appearance.  HENT:     Mouth/Throat:     Mouth: Mucous membranes are moist.  Eyes:     Conjunctiva/sclera: Conjunctivae normal.     Pupils: Pupils are equal, round, and reactive to light.  Cardiovascular:     Rate and Rhythm: Normal rate and regular rhythm.     Pulses: Normal pulses.     Heart sounds: Normal heart sounds.  Pulmonary:     Effort: Pulmonary effort is normal.     Breath sounds: Normal breath sounds.  Abdominal:     General: Bowel sounds are normal.     Palpations: Abdomen is soft.  Musculoskeletal:     Cervical back: Normal range of motion. No tenderness.  Skin:    General: Skin is warm.     Findings: No bruising.  Neurological:     General: No focal deficit present.     Mental Status: She is alert and oriented to person, place, and time. Mental status is at baseline.  Psychiatric:        Mood and Affect: Mood normal.        Behavior: Behavior normal.        Thought Content: Thought content normal.        Judgment: Judgment normal.     BP 122/76   Pulse 85   Temp 98.1 F (36.7 C)   Ht 5' 4 (1.626 m)   Wt 194 lb 12.8 oz (88.4 kg)   LMP 12/06/2023 (Approximate)   SpO2 98%   BMI 33.44 kg/m  Wt Readings from Last 3 Encounters:  01/11/24 194 lb 12.8 oz (88.4 kg)  09/29/23 188 lb (85.3 kg)  06/01/23 186 lb 6.4 oz (84.6 kg)     Health Maintenance  Topic Date Due   Hepatitis C Screening  Never done   Hepatitis B Vaccines (1 of 3 - 19+ 3-dose series)  Never done   HPV VACCINES (1 - 3-dose SCDM series) Never done   COVID-19 Vaccine (3 - 2024-25 season) 03/12/2023   INFLUENZA VACCINE  02/09/2024   Cervical Cancer Screening (HPV/Pap Cotest)  11/04/2025   DTaP/Tdap/Td (4 - Td or Tdap) 09/28/2026   HIV Screening  Completed   Meningococcal B Vaccine  Aged Out       Topic Date Due   Hepatitis B Vaccines (1 of 3 - 19+ 3-dose series) Never done   HPV VACCINES (1 - 3-dose SCDM series) Never done    Lab Results  Component Value Date   TSH 1.07 06/14/2022   Lab Results  Component Value Date   WBC 5.5 06/14/2022   HGB 12.6 06/14/2022   HCT 37.4 06/14/2022   MCV 93.6 06/14/2022   PLT 237.0 06/14/2022   Lab Results  Component Value Date   NA 136 09/26/2023   K 3.6 09/26/2023   CO2 22 09/26/2023   GLUCOSE 114 (H) 09/26/2023   BUN 12 09/26/2023   CREATININE 0.76 09/26/2023   BILITOT 0.9 09/26/2023   ALKPHOS 36 (L) 09/26/2023   AST 16 09/26/2023   ALT 14 09/26/2023   PROT 6.8 09/26/2023   ALBUMIN 3.6 09/26/2023   CALCIUM  8.9 09/26/2023   ANIONGAP 8 09/26/2023   GFR 98.40 06/14/2022   Lab Results  Component Value Date   CHOL 176 09/26/2023   Lab Results  Component Value Date   HDL 48 09/26/2023   Lab Results  Component Value Date   LDLCALC 95 09/26/2023   Lab  Results  Component Value Date   TRIG 167 (H) 09/26/2023   Lab Results  Component Value Date   CHOLHDL 3.7 09/26/2023   Lab Results  Component Value Date   HGBA1C 5.5 06/14/2022      Assessment & Plan:  Nonintractable headache, unspecified chronicity pattern, unspecified headache type Assessment & Plan: New daily headaches with visual disturbances and nausea. Family history of ocular migraines. Current management includes Excedrin, phenylephrine, and Xyzal. Propranolol  used for tremors. Possible migraine diagnosis. Neurology evaluation recommended. Magnesium supplementation advised. Nurtec sample  provided. - Refer to neurology for headache  evaluation. - Continue magnesium glycinate 400 mg daily. - Provide Nurtec sample for  management. - Continue hydration, stress management, and adequate sleep.   Orders: -     Ambulatory referral to Neurology  Other orders -     Nurtec; Take 1 tablet (75 mg total) by mouth as needed.    Follow-up: No follow-ups on file.   Javarius Tsosie, NP

## 2024-02-01 NOTE — Assessment & Plan Note (Addendum)
 New daily headaches with visual disturbances and nausea. Family history of ocular migraines. Current management includes Excedrin, phenylephrine, and Xyzal. Propranolol  used for tremors. Possible migraine diagnosis. Neurology evaluation recommended. Magnesium supplementation advised. Nurtec sample provided. - Refer to neurology for headache evaluation. - Continue magnesium glycinate 400 mg daily. - Provide Nurtec sample for management. - Continue hydration, stress management, and adequate sleep.

## 2024-02-11 ENCOUNTER — Encounter: Payer: Self-pay | Admitting: Internal Medicine

## 2024-02-13 ENCOUNTER — Other Ambulatory Visit: Payer: Self-pay

## 2024-02-13 MED ORDER — SCOPOLAMINE 1 MG/3DAYS TD PT72
1.0000 | MEDICATED_PATCH | TRANSDERMAL | 0 refills | Status: AC
  Filled 2024-02-13: qty 4, 12d supply, fill #0

## 2024-02-13 MED ORDER — ONDANSETRON 4 MG PO TBDP
4.0000 mg | ORAL_TABLET | Freq: Three times a day (TID) | ORAL | 0 refills | Status: AC | PRN
Start: 2024-02-13 — End: ?
  Filled 2024-02-13: qty 20, 7d supply, fill #0

## 2024-03-04 ENCOUNTER — Other Ambulatory Visit: Payer: Self-pay

## 2024-03-21 ENCOUNTER — Other Ambulatory Visit: Payer: Self-pay

## 2024-03-21 ENCOUNTER — Ambulatory Visit: Admitting: Obstetrics

## 2024-03-21 ENCOUNTER — Encounter: Payer: Self-pay | Admitting: Obstetrics

## 2024-03-21 VITALS — BP 111/69 | HR 64 | Ht 64.0 in | Wt 197.1 lb

## 2024-03-21 DIAGNOSIS — Z01419 Encounter for gynecological examination (general) (routine) without abnormal findings: Secondary | ICD-10-CM

## 2024-03-21 DIAGNOSIS — Z1231 Encounter for screening mammogram for malignant neoplasm of breast: Secondary | ICD-10-CM

## 2024-03-21 DIAGNOSIS — Z309 Encounter for contraceptive management, unspecified: Secondary | ICD-10-CM

## 2024-03-21 MED ORDER — NORETHIN ACE-ETH ESTRAD-FE 1-20 MG-MCG PO TABS
1.0000 | ORAL_TABLET | Freq: Every day | ORAL | 3 refills | Status: AC
  Filled 2024-03-21: qty 84, 84d supply, fill #0
  Filled 2024-06-20: qty 84, 84d supply, fill #1

## 2024-03-21 NOTE — Progress Notes (Signed)
 ANNUAL GYNECOLOGICAL EXAM  SUBJECTIVE  HPI  Brianna Washington is a 43 y.o.-year-old G2P2001 who presents for an annual gynecological exam today.  She denies pelvic pain, dyspareunia, abnormal vaginal bleeding or discharge, and UTI symptoms. She has been having hot flashes about 2-3 times a week and is wondering about perimenopause.  Medical/Surgical History Past Medical History:  Diagnosis Date   Allergy    Depression    Tremors of nervous system    Essential Tremors (labetalol )   Past Surgical History:  Procedure Laterality Date   TONSILLECTOMY  2010    Social History Lives with husband and children. Feels safe there Work: Perioperative RN at [employer] Exercise: 3x/week Substances: Daily EtOH. Denies tobacco, vape, and recreational drugs  Obstetric History OB History     Gravida  2   Para  2   Term  2   Preterm  0   AB  0   Living  1      SAB  0   IAB  0   Ectopic  0   Multiple  0   Live Births  1            GYN/Menstrual History Patient's last menstrual period was 03/06/2024 (exact date). Regular monthly periods Last Pap: 11/04/2020. NILM/negative HPV Contraception: COCs  Prevention Endorses regular dental and eye exams Mammogram: yearly Colonoscopy: at 47 Flu shot/vaccines: at work  Current Medications Outpatient Medications Prior to Visit  Medication Sig   ALPRAZolam  (XANAX ) 0.25 MG tablet Take 1 tablet (0.25 mg total) by mouth at bedtime as needed for anxiety or sleep.   aspirin-acetaminophen -caffeine (EXCEDRIN EXTRA STRENGTH) 250-250-65 MG tablet    B Complex-C (VITAMIN B + C COMPLEX PO)    Cholecalciferol (VITAMIN D3 PO) Take 1 capsule by mouth daily.   diphenhydrAMINE  (BENADRYL ) 25 MG tablet Take 25 mg by mouth every 6 (six) hours as needed.   fexofenadine (ALLEGRA) 60 MG tablet Take 60 mg by mouth daily.    fluticasone  (FLONASE ) 50 MCG/ACT nasal spray Place 2 sprays into both nostrils daily.   norethindrone -ethinyl estradiol -FE  (JUNEL  FE 1/20) 1-20 MG-MCG tablet Take 1 tablet by mouth daily.   Omega-3 Fatty Acids (FISH OIL) 1200 MG CAPS Take 1 capsule by mouth daily.   ondansetron  (ZOFRAN -ODT) 4 MG disintegrating tablet Take 1 tablet (4 mg total) by mouth every 8 (eight) hours as needed for nausea or vomiting.   propranolol  ER (INDERAL  LA) 60 MG 24 hr capsule Take 1 capsule (60 mg total) by mouth daily.   Rimegepant Sulfate (NURTEC) 75 MG TBDP Take 1 tablet (75 mg total) by mouth as needed.   sertraline  (ZOLOFT ) 50 MG tablet Take 1 tablet (50 mg total) by mouth daily.   traZODone  (DESYREL ) 50 MG tablet Take 1 tablet (50 mg total) by mouth at bedtime.   Turmeric 400 MG CAPS Take 1 capsule by mouth daily.   vitamin C (ASCORBIC ACID) 500 MG tablet Take 500 mg by mouth 2 (two) times daily.   Vitamin D -Vitamin K (VITAMIN K2-VITAMIN D3 PO)    levocetirizine (XYZAL) 5 MG tablet Take 5 mg by mouth every evening. (Patient not taking: Reported on 03/21/2024)   scopolamine  (TRANSDERM SCOP , 1.5 MG,) 1 MG/3DAYS Place 1 patch (1.5 mg total) onto the skin every 3 (three) days. (Patient not taking: Reported on 03/21/2024)   [DISCONTINUED] Semaglutide -Weight Management (WEGOVY ) 0.25 MG/0.5ML SOAJ Inject 0.25 mg into the skin once a week.   No facility-administered medications prior to visit.  [facility] - 03/21/24 1341       Pregnancy Intention Screening   Does the patient want to become pregnant in the next year? No    Does the patient's partner want to become pregnant in the next year? No    Would the patient like to discuss contraceptive options today? No      Contraception Wrap Up   Current Method Oral Contraceptive    End Method Oral Contraceptive    Contraception Counseling Provided No           ROS Constitutional: Denied constitutional symptoms, night sweats, recent illness, fatigue, fever, insomnia and weight loss.  Eyes: Denied eye symptoms, eye pain, photophobia, vision change and visual disturbance.   Ears/Nose/Throat/Neck: Denied ear, nose, throat or neck symptoms, hearing loss, nasal discharge, sinus congestion and sore throat.  Cardiovascular: Denied cardiovascular symptoms, arrhythmia, chest pain/pressure, edema, exercise intolerance, orthopnea and palpitations.  Respiratory: Denied pulmonary symptoms, asthma, pleuritic pain, productive sputum, cough, dyspnea and wheezing.  Gastrointestinal: Denied gastro-esophageal reflux, melena, nausea and vomiting.  Genitourinary: Denied genitourinary symptoms including symptomatic vaginal discharge, pelvic relaxation issues, and urinary complaints.  Musculoskeletal: Denied musculoskeletal symptoms, stiffness, swelling, muscle weakness and myalgia.  Dermatologic: Denied dermatology symptoms, rash and scar.  Neurologic: Denied neurology symptoms, dizziness, headache, neck pain and syncope.  Psychiatric: Denied psychiatric symptoms, anxiety and depression.  Endocrine: Denied endocrine symptoms including hot flashes and night sweats.    OBJECTIVE  BP 111/69   Pulse 64   Ht 5' 4 (1.626 m)   Wt 197 lb 1.6 oz (89.4 kg)   LMP 03/06/2024 (Exact Date)   BMI 33.83 kg/m    Physical examination General NAD, Conversant  HEENT Atraumatic; Op clear with mmm.  Normo-cephalic. Pupils reactive. Anicteric sclerae  Thyroid /Neck Smooth without nodularity or enlargement. Normal ROM.  Neck Supple.  Skin No rashes, lesions or ulceration. Normal palpated skin turgor. No nodularity.  Breasts: No masses or discharge.  Symmetric.  No axillary adenopathy.  Lungs: Clear to auscultation.No rales or wheezes. Normal Respiratory effort, no retractions.  Heart: NSR.  No murmurs or rubs appreciated. No peripheral edema  Abdomen: Soft.  Non-tender.  No masses.  No HSM. No hernia  Extremities: Moves all appropriately.  Normal ROM for age. No lymphadenopathy.  Neuro: Oriented to PPT.  Normal mood. Normal affect.     Pelvic: Declined    ASSESSMENT  1) Annual exam 2)  Contraception  PLAN 1) Physical exam as noted. Discussed healthy lifestyle choices and preventive care. Declines STI testing. Labs: CBC, TSH A1C. Mammogram ordered. Pap due 2027. 2) COCs refilled  Return in one year for annual exam or as needed for concerns.   Vere Diantonio, CNM

## 2024-03-21 NOTE — Patient Instructions (Signed)
 Preventive Care 41-41 Years Old, Female  Preventive care refers to lifestyle choices and visits with your health care provider that can promote health and wellness. Preventive care visits are also called wellness exams.  What can I expect for my preventive care visit?  Counseling  Your health care provider may ask you questions about your:  Medical history, including:  Past medical problems.  Family medical history.  Pregnancy history.  Current health, including:  Menstrual cycle.  Method of birth control.  Emotional well-being.  Home life and relationship well-being.  Sexual activity and sexual health.  Lifestyle, including:  Alcohol, nicotine or tobacco, and drug use.  Access to firearms.  Diet, exercise, and sleep habits.  Work and work Astronomer.  Sunscreen use.  Safety issues such as seatbelt and bike helmet use.  Physical exam  Your health care provider will check your:  Height and weight. These may be used to calculate your BMI (body mass index). BMI is a measurement that tells if you are at a healthy weight.  Waist circumference. This measures the distance around your waistline. This measurement also tells if you are at a healthy weight and may help predict your risk of certain diseases, such as type 2 diabetes and high blood pressure.  Heart rate and blood pressure.  Body temperature.  Skin for abnormal spots.  What immunizations do I need?    Vaccines are usually given at various ages, according to a schedule. Your health care provider will recommend vaccines for you based on your age, medical history, and lifestyle or other factors, such as travel or where you work.  What tests do I need?  Screening  Your health care provider may recommend screening tests for certain conditions. This may include:  Lipid and cholesterol levels.  Diabetes screening. This is done by checking your blood sugar (glucose) after you have not eaten for a while (fasting).  Pelvic exam and Pap test.  Hepatitis B test.  Hepatitis C  test.  HIV (human immunodeficiency virus) test.  STI (sexually transmitted infection) testing, if you are at risk.  Lung cancer screening.  Colorectal cancer screening.  Mammogram. Talk with your health care provider about when you should start having regular mammograms. This may depend on whether you have a family history of breast cancer.  BRCA-related cancer screening. This may be done if you have a family history of breast, ovarian, tubal, or peritoneal cancers.  Bone density scan. This is done to screen for osteoporosis.  Talk with your health care provider about your test results, treatment options, and if necessary, the need for more tests.  Follow these instructions at home:  Eating and drinking    Eat a diet that includes fresh fruits and vegetables, whole grains, lean protein, and low-fat dairy products.  Take vitamin and mineral supplements as recommended by your health care provider.  Do not drink alcohol if:  Your health care provider tells you not to drink.  You are pregnant, may be pregnant, or are planning to become pregnant.  If you drink alcohol:  Limit how much you have to 0-1 drink a day.  Know how much alcohol is in your drink. In the [place] one drink equals one 12 oz bottle of beer (355 mL), one 5 oz glass of wine (148 mL), or one 1 oz glass of hard liquor (44 mL).  Lifestyle  Brush your teeth every morning and night with fluoride toothpaste. Floss one time each day.  Exercise for at least  30 minutes 5 or more days each week.  Do not use any products that contain nicotine or tobacco. These products include cigarettes, chewing tobacco, and vaping devices, such as e-cigarettes. If you need help quitting, ask your health care provider.  Do not use drugs.  If you are sexually active, practice safe sex. Use a condom or other form of protection to prevent STIs.  If you do not wish to become pregnant, use a form of birth control. If you plan to become pregnant, see your health care provider for a  prepregnancy visit.  Take aspirin only as told by your health care provider. Make sure that you understand how much to take and what form to take. Work with your health care provider to find out whether it is safe and beneficial for you to take aspirin daily.  Find healthy ways to manage stress, such as:  Meditation, yoga, or listening to music.  Journaling.  Talking to a trusted person.  Spending time with friends and family.  Minimize exposure to UV radiation to reduce your risk of skin cancer.  Safety  Always wear your seat belt while driving or riding in a vehicle.  Do not drive:  If you have been drinking alcohol. Do not ride with someone who has been drinking.  When you are tired or distracted.  While texting.  If you have been using any mind-altering substances or drugs.  Wear a helmet and other protective equipment during sports activities.  If you have firearms in your house, make sure you follow all gun safety procedures.  Seek help if you have been physically or sexually abused.  What's next?  Visit your health care provider once a year for an annual wellness visit.  Ask your health care provider how often you should have your eyes and teeth checked.  Stay up to date on all vaccines.  This information is not intended to replace advice given to you by your health care provider. Make sure you discuss any questions you have with your health care provider.  Document Revised: 12/23/2020 Document Reviewed: 12/23/2020  Elsevier Patient Education  2024 [employer].

## 2024-03-22 ENCOUNTER — Other Ambulatory Visit: Payer: Self-pay

## 2024-03-22 LAB — CBC
Hematocrit: 38.3 % (ref 34.0–46.6)
Hemoglobin: 12.6 g/dL (ref 11.1–15.9)
MCH: 31.7 pg (ref 26.6–33.0)
MCHC: 32.9 g/dL (ref 31.5–35.7)
MCV: 96 fL (ref 79–97)
Platelets: 231 x10E3/uL (ref 150–450)
RBC: 3.98 x10E6/uL (ref 3.77–5.28)
RDW: 12.4 % (ref 11.7–15.4)
WBC: 6.5 x10E3/uL (ref 3.4–10.8)

## 2024-03-22 LAB — TSH: TSH: 0.911 u[IU]/mL (ref 0.450–4.500)

## 2024-03-22 LAB — HEMOGLOBIN A1C
Est. average glucose Bld gHb Est-mCnc: 117 mg/dL
Hgb A1c MFr Bld: 5.7 % — ABNORMAL HIGH (ref 4.8–5.6)

## 2024-03-25 ENCOUNTER — Ambulatory Visit: Payer: Self-pay | Admitting: Obstetrics

## 2024-04-08 ENCOUNTER — Ambulatory Visit: Admitting: Internal Medicine

## 2024-04-27 ENCOUNTER — Other Ambulatory Visit: Payer: Self-pay | Admitting: Internal Medicine

## 2024-04-29 ENCOUNTER — Other Ambulatory Visit: Payer: Self-pay

## 2024-04-29 MED ORDER — PROPRANOLOL HCL ER 60 MG PO CP24
60.0000 mg | ORAL_CAPSULE | Freq: Every day | ORAL | 1 refills | Status: AC
  Filled 2024-04-29: qty 90, 90d supply, fill #0
  Filled 2024-06-20 – 2024-07-28 (×3): qty 90, 90d supply, fill #1

## 2024-04-29 MED ORDER — SERTRALINE HCL 50 MG PO TABS
50.0000 mg | ORAL_TABLET | Freq: Every day | ORAL | 1 refills | Status: AC
  Filled 2024-04-29: qty 90, 90d supply, fill #0
  Filled 2024-06-20 – 2024-07-28 (×2): qty 90, 90d supply, fill #1

## 2024-05-29 ENCOUNTER — Other Ambulatory Visit: Payer: Self-pay | Admitting: Obstetrics

## 2024-05-29 DIAGNOSIS — N63 Unspecified lump in unspecified breast: Secondary | ICD-10-CM

## 2024-06-04 ENCOUNTER — Ambulatory Visit: Admitting: Internal Medicine

## 2024-06-04 VITALS — BP 108/56 | HR 81 | Ht 64.0 in | Wt 202.4 lb

## 2024-06-04 DIAGNOSIS — E66811 Obesity, class 1: Secondary | ICD-10-CM

## 2024-06-04 DIAGNOSIS — E663 Overweight: Secondary | ICD-10-CM | POA: Diagnosis not present

## 2024-06-04 DIAGNOSIS — F409 Phobic anxiety disorder, unspecified: Secondary | ICD-10-CM

## 2024-06-04 DIAGNOSIS — F5105 Insomnia due to other mental disorder: Secondary | ICD-10-CM | POA: Diagnosis not present

## 2024-06-04 DIAGNOSIS — Z0000 Encounter for general adult medical examination without abnormal findings: Secondary | ICD-10-CM | POA: Diagnosis not present

## 2024-06-04 DIAGNOSIS — F411 Generalized anxiety disorder: Secondary | ICD-10-CM

## 2024-06-04 NOTE — Patient Instructions (Signed)
 I'm proud of you!     Today we discussed adding a weight loss  medication called Contrave  that is effective for helping  emotional eaters  lose or maintain previous weight loss. It is available for $100/month through [employer]  (patient support program by [employer] )  and will come from their mail order pharmacy .  If you are interested in starting this medication,  scan the qr code on the handout,  which will take you to their website to set up your account.    Contrave  contains wellbutrin  and naltrexone .  The dose is titrated over the first month  One tablet in AM daily for one week,  One tablet twice daily for week 2; 2 tablets in AM and 1 tablet in PM for week 3;  2 tablets twice daily Week 4     Continue sertraline  and trazodone  to avoid worsening anxiety

## 2024-06-04 NOTE — Progress Notes (Unsigned)
 Subjective:  Patient ID: Brianna Washington, female    DOB: [birth year]  Age: 43 y.o. MRN: [id number]  CC: The encounter diagnosis was Overweight (BMI 25.0-29.9).   HPI Brianna Washington presents for  Chief Complaint  Patient presents with   Medical Management of Chronic Issues    6 month follow up    Obesity:  has joined  [employer]  weight training and cardio for the last 4 weeks.   GAD:  using sertraline .  Trazodone  50 mg working well for her insomnia  alprazolam  2/month.    Outpatient Medications Prior to Visit  Medication Sig Dispense Refill   ALPRAZolam  (XANAX ) 0.25 MG tablet Take 1 tablet (0.25 mg total) by mouth at bedtime as needed for anxiety or sleep. 30 tablet 3   aspirin-acetaminophen -caffeine (EXCEDRIN EXTRA STRENGTH) 250-250-65 MG tablet      B Complex-C (VITAMIN B + C COMPLEX PO)      cetirizine  (ZYRTEC ) 5 MG chewable tablet Chew 5 mg by mouth daily.     Cholecalciferol (VITAMIN D3 PO) Take 1 capsule by mouth daily.     diphenhydrAMINE  (BENADRYL ) 25 MG tablet Take 25 mg by mouth every 6 (six) hours as needed.     fluticasone  (FLONASE ) 50 MCG/ACT nasal spray Place 2 sprays into both nostrils daily. 16 g 0   norethindrone -ethinyl estradiol -FE (JUNEL  FE 1/20) 1-20 MG-MCG tablet Take 1 tablet by mouth daily. 84 tablet 3   Omega-3 Fatty Acids (FISH OIL) 1200 MG CAPS Take 1 capsule by mouth daily.     ondansetron  (ZOFRAN -ODT) 4 MG disintegrating tablet Take 1 tablet (4 mg total) by mouth every 8 (eight) hours as needed for nausea or vomiting. 20 tablet 0   propranolol  ER (INDERAL  LA) 60 MG 24 hr capsule Take 1 capsule (60 mg total) by mouth daily. 90 capsule 1   Rimegepant Sulfate (NURTEC) 75 MG TBDP Take 1 tablet (75 mg total) by mouth as needed.     sertraline  (ZOLOFT ) 50 MG tablet Take 1 tablet (50 mg total) by mouth daily. 90 tablet 1   traZODone  (DESYREL ) 50 MG tablet Take 1 tablet (50 mg total) by mouth at bedtime. 90 tablet 3   Turmeric 400 MG CAPS Take 1  capsule by mouth daily. 90 capsule 1   vitamin C (ASCORBIC ACID) 500 MG tablet Take 500 mg by mouth 2 (two) times daily.     Vitamin D -Vitamin K (VITAMIN K2-VITAMIN D3 PO)      scopolamine  (TRANSDERM SCOP , 1.5 MG,) 1 MG/3DAYS Place 1 patch (1.5 mg total) onto the skin every 3 (three) days. (Patient not taking: Reported on 06/04/2024) 4 patch 0   fexofenadine (ALLEGRA) 60 MG tablet Take 60 mg by mouth daily.  (Patient not taking: Reported on 06/04/2024)     levocetirizine (XYZAL) 5 MG tablet Take 5 mg by mouth every evening. (Patient not taking: Reported on 06/04/2024)     No facility-administered medications prior to visit.    Review of Systems;  Patient denies headache, fevers, malaise, unintentional weight loss, skin rash, eye pain, sinus congestion and sinus pain, sore throat, dysphagia,  hemoptysis , cough, dyspnea, wheezing, chest pain, palpitations, orthopnea, edema, abdominal pain, nausea, melena, diarrhea, constipation, flank pain, dysuria, hematuria, urinary  Frequency, nocturia, numbness, tingling, seizures,  Focal weakness, Loss of consciousness,  Tremor, insomnia, depression, anxiety, and suicidal ideation.      Objective:  BP (!) 108/56   Pulse 81   Ht 5' 4 (1.626 m)   Wt  202 lb 6.4 oz (91.8 kg)   SpO2 97%   BMI 34.74 kg/m   BP Readings from Last 3 Encounters:  06/04/24 (!) 108/56  03/21/24 111/69  01/11/24 122/76    Wt Readings from Last 3 Encounters:  06/04/24 202 lb 6.4 oz (91.8 kg)  03/21/24 197 lb 1.6 oz (89.4 kg)  01/11/24 194 lb 12.8 oz (88.4 kg)    Physical Exam  Lab Results  Component Value Date   HGBA1C 5.7 (H) 03/21/2024   HGBA1C 5.5 06/14/2022   HGBA1C 4.9 05/09/2016    Lab Results  Component Value Date   CREATININE 0.76 09/26/2023   CREATININE 0.76 06/14/2022   CREATININE 0.76 01/06/2021    Lab Results  Component Value Date   WBC 6.5 03/21/2024   HGB 12.6 03/21/2024   HCT 38.3 03/21/2024   PLT 231 03/21/2024   GLUCOSE 114 (H)  09/26/2023   CHOL 176 09/26/2023   TRIG 167 (H) 09/26/2023   HDL 48 09/26/2023   LDLDIRECT 109 (H) 09/26/2023   LDLCALC 95 09/26/2023   ALT 14 09/26/2023   AST 16 09/26/2023   NA 136 09/26/2023   K 3.6 09/26/2023   CL 106 09/26/2023   CREATININE 0.76 09/26/2023   BUN 12 09/26/2023   CO2 22 09/26/2023   TSH 0.911 03/21/2024   HGBA1C 5.7 (H) 03/21/2024    No results found.  Assessment & Plan:  .Overweight (BMI 25.0-29.9)     I spent 34 minutes on the day of this face to face encounter reviewing patient's  most recent visit with cardiology,  nephrology,  and neurology,  prior relevant surgical and non surgical procedures, recent  labs and imaging studies, counseling on weight management,  reviewing the assessment and plan with patient, and post visit ordering and reviewing of  diagnostics and therapeutics with patient  .   Follow-up: No follow-ups on file.   Verneita LITTIE Kettering, MD

## 2024-06-06 NOTE — Assessment & Plan Note (Signed)

## 2024-06-06 NOTE — Assessment & Plan Note (Signed)
 I have congratulated her  for making a serious effort to reduce   BMI s using a low glycemic index diet and regular exercise a minimum of 5 days per week.  Discussed adding   Contrave  if she is not happy with her progress.

## 2024-06-06 NOTE — Assessment & Plan Note (Signed)
 Improved with resuming of  sertraline  50 mg daily.  Using alprazolam  sparingly,  The risks and benefits of  Chronic  benzodiazepine use were reviewed with patient today including increased risk of dementia,  Addiction, and seizures if abruptly withdrawn  .  Patient was encouraged to reduce use of clonazepam  gradually,  Starting with reduction of one dose to 1/2 tablet and use buspirone if needed  .

## 2024-06-06 NOTE — Assessment & Plan Note (Signed)
 Managed with trazodonenw at 50 mg dose .

## 2024-06-06 NOTE — Progress Notes (Signed)
 Patient ID: Brianna Washington, female    DOB: March 10, 1981  Age: 43 y.o. MRN: [id number]  The patient is here for annual preventive examination and management of other chronic and acute problems.   The risk factors are reflected in the social history.   The roster of all physicians providing medical care to patient - is listed in the Snapshot section of the chart.   Activities of daily living:  The patient is 100% independent in all ADLs: dressing, toileting, feeding as well as independent mobility   Home safety : The patient has smoke detectors in the home. They wear seatbelts.  There are no unsecured firearms at home. There is no violence in the home.    There is no risks for hepatitis, STDs or HIV. There is no   history of blood transfusion. They have no travel history to infectious disease endemic areas of the world.   The patient has seen their dentist in the last six month. They have seen their eye doctor in the last year. The patinet  denies slight hearing difficulty with regard to whispered voices and some television programs.  They have deferred audiologic testing in the last year.  They do not  have excessive sun exposure. Discussed the need for sun protection: hats, long sleeves and use of sunscreen if there is significant sun exposure.    Diet: the importance of a healthy diet is discussed. They do have a healthy diet.   The benefits of regular aerobic exercise were discussed. The patient  exercises  3 to 5 days per week  for  60 minutes.    Depression screen: there are no signs or vegative symptoms of depression- irritability, change in appetite, anhedonia, sadness/tearfullness.   The following portions of the patient's history were reviewed and updated as appropriate: allergies, current medications, past family history, past medical history,  past surgical history, past social history  and problem list.   Visual acuity was not assessed per patient preference since the patient has  regular follow up with an  ophthalmologist. Hearing and body mass index were assessed and reviewed.    During the course of the visit the patient was educated and counseled about appropriate screening and preventive services including : fall prevention , diabetes screening, nutrition counseling, colorectal cancer screening, and recommended immunizations.    Chief Complaint:  Obesity:  has joined  [employer]  and has been exercising regularly with  weight training and cardio for the last 4 weeks.   GAD:  using sertraline  with good control of symptoms.  Using   Trazodone  50 mg working well for her insomnia .  Average use of  alprazolam  2/month.      Review of Symptoms  Patient denies headache, fevers, malaise, unintentional weight loss, skin rash, eye pain, sinus congestion and sinus pain, sore throat, dysphagia,  hemoptysis , cough, dyspnea, wheezing, chest pain, palpitations, orthopnea, edema, abdominal pain, nausea, melena, diarrhea, constipation, flank pain, dysuria, hematuria, urinary  Frequency, nocturia, numbness, tingling, seizures,  Focal weakness, Loss of consciousness,  Tremor, insomnia, depression, anxiety, and suicidal ideation.    Physical Exam:  BP (!) 108/56   Pulse 81   Ht 5' 4 (1.626 m)   Wt 202 lb 6.4 oz (91.8 kg)   SpO2 97%   BMI 34.74 kg/m    Physical Exam Vitals reviewed.  Constitutional:      General: She is not in acute distress.    Appearance: Normal appearance. She is obese. She is  not ill-appearing, toxic-appearing or diaphoretic.  HENT:     Head: Normocephalic.  Eyes:     General: No scleral icterus.       Right eye: No discharge.        Left eye: No discharge.     Conjunctiva/sclera: Conjunctivae normal.  Cardiovascular:     Rate and Rhythm: Normal rate and regular rhythm.     Heart sounds: Normal heart sounds.  Pulmonary:     Effort: Pulmonary effort is normal. No respiratory distress.     Breath sounds: Normal breath sounds.   Musculoskeletal:        General: Normal range of motion.  Skin:    General: Skin is warm and dry.  Neurological:     General: No focal deficit present.     Mental Status: She is alert and oriented to person, place, and time. Mental status is at baseline.  Psychiatric:        Mood and Affect: Mood normal.        Behavior: Behavior normal.        Thought Content: Thought content normal.        Judgment: Judgment normal.     Assessment and Plan: Overweight (BMI 25.0-29.9) Assessment & Plan: I have congratulated her  for making a serious effort to reduce   BMI s using a low glycemic index diet and regular exercise a minimum of 5 days per week.  Discussed adding   Contrave  if she is not happy with her progress.   Orders: -     Lipid panel; Future -     LDL cholesterol, direct; Future -     Comprehensive metabolic panel with GFR; Future -     TSH; Future -     CBC with Differential/Platelet; Future  Insomnia due to anxiety and fear Assessment & Plan: Managed with trazodonenw at 50 mg dose .   Encounter for preventive health examination Assessment & Plan: age appropriate education and counseling updated, referrals for preventative services and immunizations addressed, dietary and smoking counseling addressed, most recent labs reviewed.  I have personally reviewed and have noted:   1) the patient's medical and social history 2) The pt's use of alcohol, tobacco, and illicit drugs 3) The patient's current medications and supplements 4) Functional ability including ADL's, fall risk, home safety risk, hearing and visual impairment 5) Diet and physical activities 6) Evidence for depression or mood disorder 7) The patient's height, weight, and BMI have been recorded in the chart    I have made referrals, and provided counseling and education based on review of the above    Obesity (BMI 30.0-34.9) Assessment & Plan: I have congratulated her  for making a serious effort to reduce    BMI s using a low glycemic index diet and regular exercise a minimum of 5 days per week.  Discussed adding   Contrave  if she is not happy with her progress.    Anxiety state Assessment & Plan: Improved with resuming of  sertraline  50 mg daily.  Using alprazolam  sparingly,  The risks and benefits of  Chronic  benzodiazepine use were reviewed with patient today including increased risk of dementia,  Addiction, and seizures if abruptly withdrawn  .  Patient was encouraged to reduce use of clonazepam  gradually,  Starting with reduction of one dose to 1/2 tablet and use buspirone if needed  .       Return in about 6 months (around 12/02/2024).  Verneita LITTIE Kettering, MD

## 2024-06-13 ENCOUNTER — Other Ambulatory Visit: Payer: Self-pay

## 2024-06-13 ENCOUNTER — Other Ambulatory Visit: Payer: Self-pay | Admitting: Obstetrics and Gynecology

## 2024-06-13 DIAGNOSIS — R232 Flushing: Secondary | ICD-10-CM

## 2024-06-13 MED ORDER — ESTRADIOL 0.025 MG/24HR TD PTTW
1.0000 | MEDICATED_PATCH | TRANSDERMAL | 6 refills | Status: AC
  Filled 2024-06-13: qty 16, 56d supply, fill #0
  Filled 2024-08-07: qty 8, 28d supply, fill #1

## 2024-06-18 ENCOUNTER — Encounter: Payer: Self-pay | Admitting: Internal Medicine

## 2024-06-20 ENCOUNTER — Other Ambulatory Visit: Payer: Self-pay

## 2024-06-20 MED ORDER — CONTRAVE 8-90 MG PO TB12: ORAL_TABLET | ORAL | 0 refills | Status: DC

## 2024-06-21 ENCOUNTER — Other Ambulatory Visit: Payer: Self-pay

## 2024-07-02 ENCOUNTER — Telehealth: Payer: Self-pay

## 2024-07-02 ENCOUNTER — Other Ambulatory Visit (HOSPITAL_COMMUNITY): Payer: Self-pay

## 2024-07-02 NOTE — Telephone Encounter (Signed)
 Checked eFax folder and do not see the form yet.    Copied from CRM #[id number]. Topic: Clinical - Medication Prior Auth >> Jul 02, 2024  1:36 PM [name] wrote: Reason for CRM: [name] calling with [name] requesting a prior authorization for: Naltrexone -buPROPion  HCl ER (CONTRAVE ) 8-90 MG TB12  She also faxed over a weight loss form as well as another PA request.  Preferred pharmacy for medication: [facility] [place], [place] - [address] [place] [address] Phone: [phone] Fax: [phone] Hours: Not open 24 hours

## 2024-07-02 NOTE — Telephone Encounter (Signed)
 PA needed

## 2024-07-03 ENCOUNTER — Telehealth: Payer: Self-pay

## 2024-07-03 ENCOUNTER — Other Ambulatory Visit (HOSPITAL_COMMUNITY): Payer: Self-pay

## 2024-07-03 NOTE — Telephone Encounter (Signed)
 Pharmacy Patient Advocate Encounter   Received notification from Onbase that prior authorization for Contrave  8-90 mg tablets is required/requested.   Insurance verification completed.   The patient is insured through [facility].   Per test claim: PA required; PA submitted to above mentioned insurance via Fax Key/confirmation #/[id number] -- Status is pending *faxed to [phone]

## 2024-07-08 ENCOUNTER — Other Ambulatory Visit (HOSPITAL_COMMUNITY): Payer: Self-pay

## 2024-07-08 NOTE — Telephone Encounter (Signed)
 noted

## 2024-08-07 ENCOUNTER — Other Ambulatory Visit: Payer: Self-pay

## 2024-08-15 ENCOUNTER — Other Ambulatory Visit: Payer: Self-pay | Admitting: Internal Medicine

## 2024-08-16 NOTE — Telephone Encounter (Signed)
 Refilled: 08/22/2023 Last OV: 06/04/2024 Next OV: 12/03/2024

## 2024-08-26 ENCOUNTER — Encounter

## 2024-08-26 ENCOUNTER — Other Ambulatory Visit

## 2024-11-28 ENCOUNTER — Other Ambulatory Visit

## 2024-12-03 ENCOUNTER — Ambulatory Visit: Admitting: Internal Medicine
